# Patient Record
Sex: Male | Born: 1945
Health system: Southern US, Community
[De-identification: ages and names within clinical notes are randomized; demographics above are authoritative.]

## PROBLEM LIST (undated history)

## (undated) DIAGNOSIS — K219 Gastro-esophageal reflux disease without esophagitis: Secondary | ICD-10-CM

## (undated) DIAGNOSIS — I340 Nonrheumatic mitral (valve) insufficiency: Secondary | ICD-10-CM

## (undated) DIAGNOSIS — T8859XA Other complications of anesthesia, initial encounter: Secondary | ICD-10-CM

## (undated) DIAGNOSIS — J189 Pneumonia, unspecified organism: Secondary | ICD-10-CM

## (undated) DIAGNOSIS — M1990 Unspecified osteoarthritis, unspecified site: Secondary | ICD-10-CM

## (undated) DIAGNOSIS — K635 Polyp of colon: Secondary | ICD-10-CM

## (undated) DIAGNOSIS — I10 Essential (primary) hypertension: Secondary | ICD-10-CM

## (undated) DIAGNOSIS — E7800 Pure hypercholesterolemia, unspecified: Secondary | ICD-10-CM

## (undated) DIAGNOSIS — K649 Unspecified hemorrhoids: Secondary | ICD-10-CM

## (undated) DIAGNOSIS — T4145XA Adverse effect of unspecified anesthetic, initial encounter: Secondary | ICD-10-CM

## (undated) DIAGNOSIS — Z8619 Personal history of other infectious and parasitic diseases: Secondary | ICD-10-CM

## (undated) DIAGNOSIS — T7840XA Allergy, unspecified, initial encounter: Secondary | ICD-10-CM

## (undated) DIAGNOSIS — H9319 Tinnitus, unspecified ear: Secondary | ICD-10-CM

## (undated) HISTORY — PX: OTHER SURGICAL HISTORY: SHX169

## (undated) HISTORY — DX: Allergy, unspecified, initial encounter: T78.40XA

## (undated) HISTORY — PX: KNEE ARTHROSCOPY: SUR90

## (undated) HISTORY — PX: COLONOSCOPY: SHX174

## (undated) HISTORY — PX: ROTATOR CUFF REPAIR: SHX139

## (undated) HISTORY — PX: ESOPHAGEAL DILATION: SHX303

## (undated) HISTORY — DX: Polyp of colon: K63.5

## (undated) HISTORY — PX: HEMORRHOID BANDING: SHX5850

## (undated) HISTORY — PX: NASAL SEPTUM SURGERY: SHX37

## (undated) HISTORY — PX: TONSILLECTOMY: SUR1361

---

## 2004-08-18 ENCOUNTER — Observation Stay (HOSPITAL_COMMUNITY): Admission: RE | Admit: 2004-08-18 | Discharge: 2004-08-19 | Payer: Self-pay | Admitting: Orthopedic Surgery

## 2004-11-20 ENCOUNTER — Encounter (INDEPENDENT_AMBULATORY_CARE_PROVIDER_SITE_OTHER): Payer: Self-pay | Admitting: Specialist

## 2004-11-20 ENCOUNTER — Ambulatory Visit (HOSPITAL_COMMUNITY): Admission: RE | Admit: 2004-11-20 | Discharge: 2004-11-20 | Payer: Self-pay | Admitting: Gastroenterology

## 2009-09-26 ENCOUNTER — Encounter: Admission: RE | Admit: 2009-09-26 | Discharge: 2009-09-26 | Payer: Self-pay | Admitting: General Surgery

## 2010-07-06 DIAGNOSIS — J189 Pneumonia, unspecified organism: Secondary | ICD-10-CM

## 2010-07-06 HISTORY — DX: Pneumonia, unspecified organism: J18.9

## 2010-07-06 HISTORY — PX: HERNIA REPAIR: SHX51
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
## 2010-11-21 NOTE — Op Note (Signed)
NAME:  James Atkinson, James Atkinson                ACCOUNT NO.:  [id number]   MEDICAL RECORD NO.:  [id number]          PATIENT TYPE:  AMB   LOCATION:  ENDO                         FACILITY:  [facility]   PHYSICIAN:  John C. Madilyn Fireman, M.D.    DATE OF BIRTH:  10-Dec-1945   DATE OF PROCEDURE:  11/20/2004  DATE OF DISCHARGE:                                 OPERATIVE REPORT   INDICATIONS FOR PROCEDURE:  Average risk colon cancer screening   PROCEDURE:  The patient was placed in the left lateral decubitus position  and placed on pulse monitor with continuous low-flow oxygen delivered by  nasal cannula. He was sedated with 75 mcg IV fentanyl and 8 mg IV Versed.  The Olympus video colonoscope was inserted into the rectum and advanced to  the cecum, confirmed by transillumination of McBurney's point, visualization  of the ileocecal valve and appendiceal orifice. The prep was excellent. The  cecum, ascending, transverse, descending, and sigmoid colon all appeared  normal with no masses, polyps, diverticula, or other mucosal abnormalities.  Within the proximal rectum at approximately 18 cm, there was an 1.2-cm  pedunculated polyp that was removed by snare. The remainder of the rectum  appeared normal. The scope was then withdrawn, and the patient returned to  the recovery room in stable condition. He tolerated the procedure well.  There were no immediate complications.   IMPRESSION:  Rectal polyp.   PLAN:  Await histology to determine method and interval for future colon  screening.      JCH/MEDQ  D:  11/20/2004  T:  11/20/2004  Job:  [id number]   cc:   Leonia Reader, M.D.

## 2012-06-20 ENCOUNTER — Ambulatory Visit: Payer: Medicare Other | Admitting: Family Medicine

## 2012-06-20 VITALS — BP 117/68 | HR 60 | Temp 98.7°F | Resp 17 | Ht 66.0 in | Wt 165.0 lb

## 2012-06-20 DIAGNOSIS — L219 Seborrheic dermatitis, unspecified: Secondary | ICD-10-CM

## 2012-06-20 DIAGNOSIS — J029 Acute pharyngitis, unspecified: Secondary | ICD-10-CM

## 2012-06-20 DIAGNOSIS — H6690 Otitis media, unspecified, unspecified ear: Secondary | ICD-10-CM

## 2012-06-20 MED ORDER — HYDROCODONE-ACETAMINOPHEN 7.5-325 MG/15ML PO SOLN: 10.0000 mL | Freq: Four times a day (QID) | ORAL | Status: DC | PRN

## 2012-06-20 MED ORDER — FLUTICASONE PROPIONATE 50 MCG/ACT NA SUSP: 2.0000 | Freq: Every day | NASAL | Status: DC

## 2012-06-20 MED ORDER — AMOXICILLIN 875 MG PO TABS: 875.0000 mg | ORAL_TABLET | Freq: Three times a day (TID) | ORAL | Status: DC

## 2012-06-20 NOTE — Progress Notes (Signed)
  Subjective:    Patient ID: James Atkinson, male    DOB: 1946/03/09, 66 y.o.   MRN: [id number] Chief Complaint  Patient presents with  . Sore Throat    HPI  4d ago dev pharyngitis and URI but this a.m. his throat hurt so bad he couldn't hardly swallow.  Not eating but drinking plenty water. Lots of sick contacts but nothing specific.  Slight cough due to dryness in throat.  Fatigued but has been able to go to work.  Has been using tylenol ES (for arthritis) and nyquil.  Has had flaky discharge from ears for years, used to be on a steroid drop until his ENT doctor told him he shouldn't be using that regularly so now he has just been putting up with it. Past Medical History  Diagnosis Date  . Allergy     Review of Systems  Constitutional: Positive for chills, activity change, appetite change and fatigue. Negative for fever and diaphoresis.  HENT: Positive for ear pain, congestion, sore throat, rhinorrhea, trouble swallowing, postnasal drip and ear discharge.   Respiratory: Positive for cough.   Musculoskeletal: Positive for arthralgias. Negative for gait problem.  Skin: Positive for rash.  Neurological: Positive for headaches. Negative for dizziness, weakness, light-headedness and numbness.  Psychiatric/Behavioral: Positive for sleep disturbance.      BP 117/68  Pulse 60  Temp 98.7 F (37.1 C) (Oral)  Resp 17  Ht 5\' 6"  (1.676 m)  Wt 165 lb (74.844 kg)  BMI 26.63 kg/m2  SpO2 96% Objective:   Physical Exam  Constitutional: He is oriented to person, place, and time. He appears well-developed and well-nourished. No distress.  HENT:  Head: Normocephalic and atraumatic.  Right Ear: External ear and ear canal normal. Tympanic membrane is retracted. A middle ear effusion is present.  Left Ear: External ear and ear canal normal. Tympanic membrane is injected and bulging. A middle ear effusion is present.  Nose: Mucosal edema and rhinorrhea present. Right sinus exhibits maxillary sinus  tenderness. Left sinus exhibits maxillary sinus tenderness.  Mouth/Throat: Uvula is midline and mucous membranes are normal. Posterior oropharyngeal edema and posterior oropharyngeal erythema present. No oropharyngeal exudate.  Eyes: Conjunctivae normal are normal. Right eye exhibits no discharge. Left eye exhibits no discharge. No scleral icterus.  Neck: Normal range of motion. Neck supple. No thyromegaly present.  Cardiovascular: Normal rate, regular rhythm, normal heart sounds and intact distal pulses.   Pulmonary/Chest: Effort normal and breath sounds normal. No respiratory distress.  Lymphadenopathy:       Head (right side): Submandibular adenopathy present.       Head (left side): Submandibular adenopathy present.    He has no cervical adenopathy.       Right: No supraclavicular adenopathy present.       Left: No supraclavicular adenopathy present.  Neurological: He is alert and oriented to person, place, and time.  Skin: Skin is warm and dry. He is not diaphoretic. No erythema.       White greasy scaling skin in bilateral ear canals  Psychiatric: He has a normal mood and affect. His behavior is normal.      Assessment & Plan:   1. Otitis media - amoxicillin  2. Seborrheic dermatitis - declines treatment w/ steroid drops due to past ENT advice.  3. Pharyngitis - no strep test needed as covered w/ abx for AOM.

## 2012-08-25 DIAGNOSIS — E785 Hyperlipidemia, unspecified: Secondary | ICD-10-CM | POA: Diagnosis not present

## 2012-08-25 DIAGNOSIS — I10 Essential (primary) hypertension: Secondary | ICD-10-CM | POA: Diagnosis not present

## 2012-08-25 DIAGNOSIS — R21 Rash and other nonspecific skin eruption: Secondary | ICD-10-CM | POA: Diagnosis not present

## 2012-09-11 ENCOUNTER — Ambulatory Visit (INDEPENDENT_AMBULATORY_CARE_PROVIDER_SITE_OTHER): Payer: 59 | Admitting: Family Medicine

## 2012-09-11 VITALS — BP 124/70 | HR 92 | Temp 99.0°F | Resp 17 | Ht 66.0 in | Wt 162.0 lb

## 2012-09-11 DIAGNOSIS — R112 Nausea with vomiting, unspecified: Secondary | ICD-10-CM | POA: Diagnosis not present

## 2012-09-11 DIAGNOSIS — R197 Diarrhea, unspecified: Secondary | ICD-10-CM

## 2012-09-11 DIAGNOSIS — K5289 Other specified noninfective gastroenteritis and colitis: Secondary | ICD-10-CM | POA: Diagnosis not present

## 2012-09-11 MED ORDER — ONDANSETRON 4 MG PO TBDP: 4.0000 mg | ORAL_TABLET | Freq: Three times a day (TID) | ORAL | Status: DC | PRN

## 2012-09-11 NOTE — Progress Notes (Signed)
Subjective:    Patient ID: James Atkinson, Atkinson    DOB: 1946/04/24, 67 y.o.   MRN: [id number]  James Atkinson is a 67 y.o. Atkinson  Started this morning at 1:30 this morning, diarrhea, vomiting at 3:30 am - twice only.  diarhrea has persisted today.  Diarrhea about 4-5 times today. No further vomiting. Had eaten some greasy food last night - chips and pizza at restaurant.  No other sick individuals at home.  No etoh, no foreign travel, no new foods/raw foods, no recent antibiotics.   Temp at home 99.2.  Tx: pepto, sprite.  Drinking fluids, gatorade, but diarrhea 30 minutes later - feels like it goes straight through.  uop 2-3 times today - clear last uop.   Review of Systems  Constitutional: Positive for chills (off and on today. ) and fatigue. Negative for fever.  Gastrointestinal: Positive for nausea, vomiting and diarrhea. Negative for abdominal pain (cramping sensation only. subsides after diarrhea. ).  Endocrine: Positive for heat intolerance.  Genitourinary: Negative for dysuria, frequency, hematuria and difficulty urinating.  Psychiatric/Behavioral: Positive for behavioral problems.       Objective:   Physical Exam  Vitals reviewed. Constitutional: He is oriented to person, place, and time. He appears well-developed and well-nourished.  HENT:  Head: Normocephalic and atraumatic.  Mouth/Throat: Oropharynx is clear and moist.  Moist mucosa.   Eyes: EOM are normal. Pupils are equal, round, and reactive to light.  Neck: Carotid bruit is not present.  Cardiovascular: Normal rate, regular rhythm and normal heart sounds.   No murmur heard. Pulmonary/Chest: Effort normal and breath sounds normal. No respiratory distress. He has no rales.  Abdominal: Soft. He exhibits no distension. Bowel sounds are increased. There is no tenderness. There is no rebound, no guarding and no CVA tenderness.  Musculoskeletal: He exhibits no edema.  Neurological: He is alert and oriented to person,  place, and time.  Skin: Skin is warm and dry.  Psychiatric: He has a normal mood and affect. His behavior is normal.       Assessment & Plan:  James Atkinson is a 67 y.o. Atkinson Nausea with vomiting - Plan: ondansetron (ZOFRAN ODT) 4 MG disintegrating tablet  Diarrhea  Other and unspecified noninfectious gastroenteritis and colitis   Suspected viral GE, appears well hydrated at present, nontender abdomen and tolerating po fluids. Discussed ORT, zofran if needed, sx care as below incl pepto and immodium if needed,  And rtc precautions.   Patient Instructions  Frequent small amounts of fluid as discussed.  zofran if needed for nausea, recheck if not improving in next 2 days. Return to the clinic or go to the nearest emergency room if any of your symptoms worsen or new symptoms occur.   Gastroenteritis:  Diarrhea Infections caused by germs (bacterial) or a virus commonly cause diarrhea. Your caregiver has determined that with time, rest and fluids, the diarrhea should improve. In general, eat normally while drinking more water than usual. Although water may prevent dehydration, it does not contain salt and minerals (electrolytes). Broths, weak tea without caffeine and oral rehydration solutions (ORS) replace fluids and electrolytes. Small amounts of fluids should be taken frequently. Large amounts at one time may not be tolerated. Plain water may be harmful in infants and the elderly. Oral rehydrating solutions (ORS) are available at pharmacies and grocery stores. ORS replace water and important electrolytes in proper proportions. Sports drinks are not as effective as ORS and may be harmful due to sugars  worsening diarrhea.  ORS is especially recommended for use in children with diarrhea. As a general guideline for children, replace any new fluid losses from diarrhea and/or vomiting with ORS as follows:   If your child weighs 22 pounds or under (10 kg or less), give 60-120 mL ( -  cup or 2  - 4 ounces) of ORS for each episode of diarrheal stool or vomiting episode.   If your child weighs more than 22 pounds (more than 10 kgs), give 120-240 mL ( - 1 cup or 4 - 8 ounces) of ORS for each diarrheal stool or episode of vomiting.   While correcting for dehydration, children should eat normally. However, foods high in sugar should be avoided because this may worsen diarrhea. Large amounts of carbonated soft drinks, juice, gelatin desserts and other highly sugared drinks should be avoided.   After correction of dehydration, other liquids that are appealing to the child may be added. Children should drink small amounts of fluids frequently and fluids should be increased as tolerated. Children should drink enough fluids to keep urine clear or pale yellow.   Adults should eat normally while drinking more fluids than usual. Drink small amounts of fluids frequently and increase as tolerated. Drink enough fluids to keep urine clear or pale yellow. Broths, weak decaffeinated tea, lemon lime soft drinks (allowed to go flat) and ORS replace fluids and electrolytes.   Avoid:   Carbonated drinks.   Juice.   Extremely hot or cold fluids.   Caffeine drinks.   Fatty, greasy foods.   Alcohol.   Tobacco.   Too much intake of anything at one time.   Gelatin desserts.   Probiotics are active cultures of beneficial bacteria. They may lessen the amount and number of diarrheal stools in adults. Probiotics can be found in yogurt with active cultures and in supplements.   Wash hands well to avoid spreading bacteria and virus.   Anti-diarrheal medications are not recommended for infants and children.   Only take over-the-counter or prescription medicines for pain, discomfort or fever as directed by your caregiver. Do not give aspirin to children because it may cause Reye's Syndrome.   For adults, ask your caregiver if you should continue all prescribed and over-the-counter medicines.   If your  caregiver has given you a follow-up appointment, it is very important to keep that appointment. Not keeping the appointment could result in a chronic or permanent injury, and disability. If there is any problem keeping the appointment, you must call back to this facility for assistance.  SEEK IMMEDIATE MEDICAL CARE IF:   You or your child is unable to keep fluids down or other symptoms or problems become worse in spite of treatment.   Vomiting or diarrhea develops and becomes persistent.   There is vomiting of blood or bile (green material).   There is blood in the stool or the stools are black and tarry.   There is no urine output in 6-8 hours or there is only a small amount of very dark urine.   Abdominal pain develops, increases or localizes.   You have a fever.   Your baby is older than 3 months with a rectal temperature of 102 F (38.9 C) or higher.   Your baby is 11 months old or younger with a rectal temperature of 100.4 F (38 C) or higher.   You or your child develops excessive weakness, dizziness, fainting or extreme thirst.   You or your child develops a  rash, stiff neck, severe headache or become irritable or sleepy and difficult to awaken.  MAKE SURE YOU:   Understand these instructions.   Will watch your condition.   Will get help right away if you are not doing well or get worse.  Document Released: 06/12/2002 Document Revised: 06/11/2011 Document Reviewed: 04/29/2009 [facility] Patient Information 2012 [place], [place].  Nausea and Vomiting Nausea is a sick feeling that often comes before throwing up (vomiting). Vomiting is a reflex where stomach contents come out of your mouth. Vomiting can cause severe loss of body fluids (dehydration). Children and elderly adults can become dehydrated quickly, especially if they also have diarrhea. Nausea and vomiting are symptoms of a condition or disease. It is important to find the cause of your symptoms. CAUSES   Direct  irritation of the stomach lining. This irritation can result from increased acid production (gastroesophageal reflux disease), infection, food poisoning, taking certain medicines (such as nonsteroidal anti-inflammatory drugs), alcohol use, or tobacco use.   Signals from the brain.These signals could be caused by a headache, heat exposure, an inner ear disturbance, increased pressure in the brain from injury, infection, a tumor, or a concussion, pain, emotional stimulus, or metabolic problems.   An obstruction in the gastrointestinal tract (bowel obstruction).   Illnesses such as diabetes, hepatitis, gallbladder problems, appendicitis, kidney problems, cancer, sepsis, atypical symptoms of a heart attack, or eating disorders.   Medical treatments such as chemotherapy and radiation.   Receiving medicine that makes you sleep (general anesthetic) during surgery.  DIAGNOSIS Your caregiver may ask for tests to be done if the problems do not improve after a few days. Tests may also be done if symptoms are severe or if the reason for the nausea and vomiting is not clear. Tests may include:  Urine tests.   Blood tests.   Stool tests.   Cultures (to look for evidence of infection).   X-rays or other imaging studies.  Test results can help your caregiver make decisions about treatment or the need for additional tests. TREATMENT You need to stay well hydrated. Drink frequently but in small amounts.You may wish to drink water, sports drinks, clear broth, or eat frozen ice pops or gelatin dessert to help stay hydrated.When you eat, eating slowly may help prevent nausea.There are also some antinausea medicines that may help prevent nausea. HOME CARE INSTRUCTIONS   Take all medicine as directed by your caregiver.   If you do not have an appetite, do not force yourself to eat. However, you must continue to drink fluids.   If you have an appetite, eat a normal diet unless your caregiver tells you  differently.   Eat a variety of complex carbohydrates (rice, wheat, potatoes, bread), lean meats, yogurt, fruits, and vegetables.   Avoid high-fat foods because they are more difficult to digest.   Drink enough water and fluids to keep your urine clear or pale yellow.   If you are dehydrated, ask your caregiver for specific rehydration instructions. Signs of dehydration may include:   Severe thirst.   Dry lips and mouth.   Dizziness.   Dark urine.   Decreasing urine frequency and amount.   Confusion.   Rapid breathing or pulse.  SEEK IMMEDIATE MEDICAL CARE IF:   You have blood or brown flecks (like coffee grounds) in your vomit.   You have black or bloody stools.   You have a severe headache or stiff neck.   You are confused.   You have severe abdominal  pain.   You have chest pain or trouble breathing.   You do not urinate at least once every 8 hours.   You develop cold or clammy skin.   You continue to vomit for longer than 24 to 48 hours.   You have a fever.  MAKE SURE YOU:   Understand these instructions.   Will watch your condition.   Will get help right away if you are not doing well or get worse.  Document Released: 06/22/2005 Document Revised: 06/11/2011 Document Reviewed: 11/19/2010 [facility] Patient Information 2012 [place], [place].  Return to the clinic or go to the nearest emergency room if any of your symptoms worsen or new symptoms occur.     Meds ordered this encounter  Medications  . ondansetron (ZOFRAN ODT) 4 MG disintegrating tablet    Sig: Take 1 tablet (4 mg total) by mouth every 8 (eight) hours as needed for nausea.    Dispense:  10 tablet    Refill:  0

## 2012-09-11 NOTE — Patient Instructions (Signed)
Frequent small amounts of fluid as discussed.  zofran if needed for nausea, recheck if not improving in next 2 days. Return to the clinic or go to the nearest emergency room if any of your symptoms worsen or new symptoms occur.   Gastroenteritis:  Diarrhea Infections caused by germs (bacterial) or a virus commonly cause diarrhea. Your caregiver has determined that with time, rest and fluids, the diarrhea should improve. In general, eat normally while drinking more water than usual. Although water may prevent dehydration, it does not contain salt and minerals (electrolytes). Broths, weak tea without caffeine and oral rehydration solutions (ORS) replace fluids and electrolytes. Small amounts of fluids should be taken frequently. Large amounts at one time may not be tolerated. Plain water may be harmful in infants and the elderly. Oral rehydrating solutions (ORS) are available at pharmacies and grocery stores. ORS replace water and important electrolytes in proper proportions. Sports drinks are not as effective as ORS and may be harmful due to sugars worsening diarrhea.  ORS is especially recommended for use in children with diarrhea. As a general guideline for children, replace any new fluid losses from diarrhea and/or vomiting with ORS as follows:   If your child weighs 22 pounds or under (10 kg or less), give 60-120 mL ( -  cup or 2 - 4 ounces) of ORS for each episode of diarrheal stool or vomiting episode.   If your child weighs more than 22 pounds (more than 10 kgs), give 120-240 mL ( - 1 cup or 4 - 8 ounces) of ORS for each diarrheal stool or episode of vomiting.   While correcting for dehydration, children should eat normally. However, foods high in sugar should be avoided because this may worsen diarrhea. Large amounts of carbonated soft drinks, juice, gelatin desserts and other highly sugared drinks should be avoided.   After correction of dehydration, other liquids that are appealing to  the child may be added. Children should drink small amounts of fluids frequently and fluids should be increased as tolerated. Children should drink enough fluids to keep urine clear or pale yellow.   Adults should eat normally while drinking more fluids than usual. Drink small amounts of fluids frequently and increase as tolerated. Drink enough fluids to keep urine clear or pale yellow. Broths, weak decaffeinated tea, lemon lime soft drinks (allowed to go flat) and ORS replace fluids and electrolytes.   Avoid:   Carbonated drinks.   Juice.   Extremely hot or cold fluids.   Caffeine drinks.   Fatty, greasy foods.   Alcohol.   Tobacco.   Too much intake of anything at one time.   Gelatin desserts.   Probiotics are active cultures of beneficial bacteria. They may lessen the amount and number of diarrheal stools in adults. Probiotics can be found in yogurt with active cultures and in supplements.   Wash hands well to avoid spreading bacteria and virus.   Anti-diarrheal medications are not recommended for infants and children.   Only take over-the-counter or prescription medicines for pain, discomfort or fever as directed by your caregiver. Do not give aspirin to children because it may cause Reye's Syndrome.   For adults, ask your caregiver if you should continue all prescribed and over-the-counter medicines.   If your caregiver has given you a follow-up appointment, it is very important to keep that appointment. Not keeping the appointment could result in a chronic or permanent injury, and disability. If there is any problem keeping the appointment, you  must call back to this facility for assistance.  SEEK IMMEDIATE MEDICAL CARE IF:   You or your child is unable to keep fluids down or other symptoms or problems become worse in spite of treatment.   Vomiting or diarrhea develops and becomes persistent.   There is vomiting of blood or bile (green material).   There is blood in  the stool or the stools are black and tarry.   There is no urine output in 6-8 hours or there is only a small amount of very dark urine.   Abdominal pain develops, increases or localizes.   You have a fever.   Your baby is older than 3 months with a rectal temperature of 102 F (38.9 C) or higher.   Your baby is 54 months old or younger with a rectal temperature of 100.4 F (38 C) or higher.   You or your child develops excessive weakness, dizziness, fainting or extreme thirst.   You or your child develops a rash, stiff neck, severe headache or become irritable or sleepy and difficult to awaken.  MAKE SURE YOU:   Understand these instructions.   Will watch your condition.   Will get help right away if you are not doing well or get worse.  Document Released: 06/12/2002 Document Revised: 06/11/2011 Document Reviewed: 04/29/2009 [facility] Patient Information 2012 [place], [place].  Nausea and Vomiting Nausea is a sick feeling that often comes before throwing up (vomiting). Vomiting is a reflex where stomach contents come out of your mouth. Vomiting can cause severe loss of body fluids (dehydration). Children and elderly adults can become dehydrated quickly, especially if they also have diarrhea. Nausea and vomiting are symptoms of a condition or disease. It is important to find the cause of your symptoms. CAUSES   Direct irritation of the stomach lining. This irritation can result from increased acid production (gastroesophageal reflux disease), infection, food poisoning, taking certain medicines (such as nonsteroidal anti-inflammatory drugs), alcohol use, or tobacco use.   Signals from the brain.These signals could be caused by a headache, heat exposure, an inner ear disturbance, increased pressure in the brain from injury, infection, a tumor, or a concussion, pain, emotional stimulus, or metabolic problems.   An obstruction in the gastrointestinal tract (bowel obstruction).    Illnesses such as diabetes, hepatitis, gallbladder problems, appendicitis, kidney problems, cancer, sepsis, atypical symptoms of a heart attack, or eating disorders.   Medical treatments such as chemotherapy and radiation.   Receiving medicine that makes you sleep (general anesthetic) during surgery.  DIAGNOSIS Your caregiver may ask for tests to be done if the problems do not improve after a few days. Tests may also be done if symptoms are severe or if the reason for the nausea and vomiting is not clear. Tests may include:  Urine tests.   Blood tests.   Stool tests.   Cultures (to look for evidence of infection).   X-rays or other imaging studies.  Test results can help your caregiver make decisions about treatment or the need for additional tests. TREATMENT You need to stay well hydrated. Drink frequently but in small amounts.You may wish to drink water, sports drinks, clear broth, or eat frozen ice pops or gelatin dessert to help stay hydrated.When you eat, eating slowly may help prevent nausea.There are also some antinausea medicines that may help prevent nausea. HOME CARE INSTRUCTIONS   Take all medicine as directed by your caregiver.   If you do not have an appetite, do not force yourself to  eat. However, you must continue to drink fluids.   If you have an appetite, eat a normal diet unless your caregiver tells you differently.   Eat a variety of complex carbohydrates (rice, wheat, potatoes, bread), lean meats, yogurt, fruits, and vegetables.   Avoid high-fat foods because they are more difficult to digest.   Drink enough water and fluids to keep your urine clear or pale yellow.   If you are dehydrated, ask your caregiver for specific rehydration instructions. Signs of dehydration may include:   Severe thirst.   Dry lips and mouth.   Dizziness.   Dark urine.   Decreasing urine frequency and amount.   Confusion.   Rapid breathing or pulse.  SEEK IMMEDIATE  MEDICAL CARE IF:   You have blood or brown flecks (like coffee grounds) in your vomit.   You have black or bloody stools.   You have a severe headache or stiff neck.   You are confused.   You have severe abdominal pain.   You have chest pain or trouble breathing.   You do not urinate at least once every 8 hours.   You develop cold or clammy skin.   You continue to vomit for longer than 24 to 48 hours.   You have a fever.  MAKE SURE YOU:   Understand these instructions.   Will watch your condition.   Will get help right away if you are not doing well or get worse.  Document Released: 06/22/2005 Document Revised: 06/11/2011 Document Reviewed: 11/19/2010 [facility] Patient Information 2012 [place], [place].  Return to the clinic or go to the nearest emergency room if any of your symptoms worsen or new symptoms occur.

## 2013-02-24 DIAGNOSIS — R7309 Other abnormal glucose: Secondary | ICD-10-CM | POA: Diagnosis not present

## 2013-02-24 DIAGNOSIS — I10 Essential (primary) hypertension: Secondary | ICD-10-CM | POA: Diagnosis not present

## 2013-02-24 DIAGNOSIS — E785 Hyperlipidemia, unspecified: Secondary | ICD-10-CM | POA: Diagnosis not present

## 2013-02-24 DIAGNOSIS — Z1331 Encounter for screening for depression: Secondary | ICD-10-CM | POA: Diagnosis not present

## 2013-02-24 DIAGNOSIS — Z23 Encounter for immunization: Secondary | ICD-10-CM | POA: Diagnosis not present

## 2013-02-24 DIAGNOSIS — L408 Other psoriasis: Secondary | ICD-10-CM | POA: Diagnosis not present

## 2013-03-23 DIAGNOSIS — L219 Seborrheic dermatitis, unspecified: Secondary | ICD-10-CM | POA: Diagnosis not present

## 2013-03-23 DIAGNOSIS — D235 Other benign neoplasm of skin of trunk: Secondary | ICD-10-CM | POA: Diagnosis not present

## 2013-06-06 ENCOUNTER — Other Ambulatory Visit: Payer: Self-pay | Admitting: Family Medicine

## 2013-06-06 ENCOUNTER — Ambulatory Visit
Admission: RE | Admit: 2013-06-06 | Discharge: 2013-06-06 | Disposition: A | Discharge status: Home / Self Care | Payer: Medicare Other | Source: Ambulatory Visit | Attending: Family Medicine | Admitting: Family Medicine

## 2013-06-06 DIAGNOSIS — M5412 Radiculopathy, cervical region: Secondary | ICD-10-CM

## 2013-06-06 DIAGNOSIS — M5030 Other cervical disc degeneration, unspecified cervical region: Secondary | ICD-10-CM | POA: Diagnosis not present

## 2013-08-24 DIAGNOSIS — Z1159 Encounter for screening for other viral diseases: Secondary | ICD-10-CM | POA: Diagnosis not present

## 2013-08-24 DIAGNOSIS — Z125 Encounter for screening for malignant neoplasm of prostate: Secondary | ICD-10-CM | POA: Diagnosis not present

## 2013-08-24 DIAGNOSIS — Z23 Encounter for immunization: Secondary | ICD-10-CM | POA: Diagnosis not present

## 2013-08-24 DIAGNOSIS — I10 Essential (primary) hypertension: Secondary | ICD-10-CM | POA: Diagnosis not present

## 2013-08-24 DIAGNOSIS — Z0000 Encounter for general adult medical examination without abnormal findings: Secondary | ICD-10-CM | POA: Diagnosis not present

## 2013-08-24 DIAGNOSIS — R7309 Other abnormal glucose: Secondary | ICD-10-CM | POA: Diagnosis not present

## 2013-08-24 DIAGNOSIS — E785 Hyperlipidemia, unspecified: Secondary | ICD-10-CM | POA: Diagnosis not present

## 2013-08-30 DIAGNOSIS — E785 Hyperlipidemia, unspecified: Secondary | ICD-10-CM | POA: Diagnosis not present

## 2013-08-30 DIAGNOSIS — I10 Essential (primary) hypertension: Secondary | ICD-10-CM | POA: Diagnosis not present

## 2013-08-30 DIAGNOSIS — Z1159 Encounter for screening for other viral diseases: Secondary | ICD-10-CM | POA: Diagnosis not present

## 2013-08-30 DIAGNOSIS — Z125 Encounter for screening for malignant neoplasm of prostate: Secondary | ICD-10-CM | POA: Diagnosis not present

## 2013-08-30 DIAGNOSIS — R7309 Other abnormal glucose: Secondary | ICD-10-CM | POA: Diagnosis not present

## 2013-10-18 DIAGNOSIS — M1710 Unilateral primary osteoarthritis, unspecified knee: Secondary | ICD-10-CM | POA: Diagnosis not present

## 2013-11-07 DIAGNOSIS — M1710 Unilateral primary osteoarthritis, unspecified knee: Secondary | ICD-10-CM | POA: Diagnosis not present

## 2014-01-01 ENCOUNTER — Encounter: Payer: Self-pay | Admitting: Internal Medicine

## 2014-01-03 ENCOUNTER — Encounter: Payer: Self-pay | Admitting: Internal Medicine

## 2014-01-03 ENCOUNTER — Ambulatory Visit (INDEPENDENT_AMBULATORY_CARE_PROVIDER_SITE_OTHER): Payer: BC Managed Care – PPO | Admitting: Internal Medicine

## 2014-01-03 VITALS — BP 106/60 | HR 64 | Ht 66.0 in | Wt 156.6 lb

## 2014-01-03 DIAGNOSIS — R1310 Dysphagia, unspecified: Secondary | ICD-10-CM | POA: Insufficient documentation

## 2014-01-03 DIAGNOSIS — R1319 Other dysphagia: Secondary | ICD-10-CM

## 2014-01-03 DIAGNOSIS — R1314 Dysphagia, pharyngoesophageal phase: Secondary | ICD-10-CM

## 2014-01-03 NOTE — Assessment & Plan Note (Signed)
Chronic intermittent solid dysphagia - needs to be evaluated with EGD and plan for esophageal dilation.  The risks and benefits as well as alternatives of endoscopic procedure(s) have been discussed and reviewed. All questions answered. The patient agrees to proceed.

## 2014-01-03 NOTE — Progress Notes (Signed)
         Subjective:    Patient ID: James Atkinson, male    DOB: Jan 16, 1946, 68 y.o.   MRN: [id number]  HPI The patient is a very nice man who knows another patient of mine - and he is here for dysphagia. Years of intermittent dysphagia. Solid foods with suprasternal sticking point. Gets some SOB when impaction occurs. His dysphagia episodes are at least weekly. Denies weight loss. Has hx colon polyps removed by another gastroenterologist Sadie Haber)  Allergies  Allergen Reactions  . Ciprofloxacin Nausea Only   Outpatient Prescriptions Prior to Visit  Medication Sig Dispense Refill  . lisinopril (PRINIVIL,ZESTRIL) 20 MG tablet Take 20 mg by mouth daily.      Marland Kitchen amoxicillin (AMOXIL) 875 MG tablet Take 1 tablet (875 mg total) by mouth 3 (three) times daily.  30 tablet  0  . fluticasone (FLONASE) 50 MCG/ACT nasal spray Place 2 sprays into the nose daily.  16 g  6  . hydrocodone-acetaminophen (HYCET) 7.5-325 MG/15ML solution Take 10-15 mLs by mouth every 6 (six) hours as needed for pain.  140 mL  0  . ondansetron (ZOFRAN ODT) 4 MG disintegrating tablet Take 1 tablet (4 mg total) by mouth every 8 (eight) hours as needed for nausea.  10 tablet  0   No facility-administered medications prior to visit.   Past Medical History  Diagnosis Date  . Allergy   . Colon polyp    Past Surgical History  Procedure Laterality Date  . Hernia repair    . Rotator cuff repair    . Knee arthroscopy    . Colonoscopy     History   Social History  . Marital Status: Married    Spouse Name: N/A    Number of Children: 2  . Years of Education: N/A   Social History Main Topics  . Smoking status: Never Smoker   . Smokeless tobacco: Never Used  . Alcohol Use: No  . Drug Use: No  . Sexual Activity: Yes    Birth Control/ Protection: None    Social History Narrative   Married, 2 adult children, 2 grandchildren   Chief Financial Officer   No caffeine   Family History  Problem Relation Age of Onset  .  COPD Father    Review of Systems Reading glasses, awaiting bilateral knee surgery ([place]) All other ROS negative or as per HPI    Objective:   Physical Exam General:  Well-developed, well-nourished and in no acute distress Eyes:  anicteric. ENT:   Mouth and posterior pharynx free of lesions.  Neck:   supple w/o thyromegaly or mass.  Lungs: Clear to auscultation bilaterally. Heart:  S1S2, no rubs, murmurs, gallops. Abdomen:  soft, non-tender, no hepatosplenomegaly, hernia, or mass and BS+.  Lymph:  no cervical or supraclavicular adenopathy. Neuro:  A&O x 3.  Psych:  appropriate mood and  Affect.      Assessment & Plan:  Esophageal dysphagia Chronic intermittent solid dysphagia - needs to be evaluated with EGD and plan for esophageal dilation.  The risks and benefits as well as alternatives of endoscopic procedure(s) have been discussed and reviewed. All questions answered. The patient agrees to proceed.   I appreciate the opportunity to care for this patient.  NW:[id number], Gwyndolyn Saxon, MD

## 2014-01-08 ENCOUNTER — Encounter: Payer: Self-pay | Admitting: Internal Medicine

## 2014-01-08 ENCOUNTER — Ambulatory Visit (AMBULATORY_SURGERY_CENTER): Payer: BC Managed Care – PPO | Admitting: Internal Medicine

## 2014-01-08 VITALS — BP 131/63 | HR 55 | Temp 97.9°F | Resp 21 | Ht 66.0 in | Wt 156.0 lb

## 2014-01-08 DIAGNOSIS — R1314 Dysphagia, pharyngoesophageal phase: Secondary | ICD-10-CM

## 2014-01-08 DIAGNOSIS — K2210 Ulcer of esophagus without bleeding: Secondary | ICD-10-CM

## 2014-01-08 DIAGNOSIS — K208 Other esophagitis without bleeding: Secondary | ICD-10-CM

## 2014-01-08 DIAGNOSIS — K222 Esophageal obstruction: Secondary | ICD-10-CM | POA: Diagnosis not present

## 2014-01-08 DIAGNOSIS — K209 Esophagitis, unspecified without bleeding: Secondary | ICD-10-CM | POA: Diagnosis not present

## 2014-01-08 DIAGNOSIS — I10 Essential (primary) hypertension: Secondary | ICD-10-CM | POA: Diagnosis not present

## 2014-01-08 DIAGNOSIS — R1310 Dysphagia, unspecified: Secondary | ICD-10-CM | POA: Diagnosis not present

## 2014-01-08 MED ORDER — SODIUM CHLORIDE 0.9 % IV SOLN: 500.0000 mL | INTRAVENOUS | Status: DC

## 2014-01-08 MED ORDER — PANTOPRAZOLE SODIUM 40 MG PO TBEC: 40.0000 mg | DELAYED_RELEASE_TABLET | Freq: Every day | ORAL | Status: DC

## 2014-01-08 NOTE — Progress Notes (Signed)
Report to PACU, RN, vss, BBS= Clear.  

## 2014-01-08 NOTE — Progress Notes (Signed)
Called to room to assist during endoscopic procedure.  Patient ID and intended procedure confirmed with present staff. Received instructions for my participation in the procedure from the performing physician.  

## 2014-01-08 NOTE — Op Note (Signed)
[place]  [address]. [place], [id number]   ENDOSCOPY PROCEDURE REPORT  PATIENT: James, Atkinson  MR#: [id number] BIRTHDATE: 1945-10-06 , 71  yrs. old GENDER: Male ENDOSCOPIST: Gatha Mayer, MD, [facility] PROCEDURE DATE:  01/08/2014 PROCEDURE:  EGD w/ biopsy  and balloon dilation < 30 mm ASA CLASS:     Class II INDICATIONS:  Dysphagia. MEDICATIONS: propofol (Diprivan) 100mg  IV, MAC sedation, administered by CRNA, and These medications were titrated to patient response per physician's verbal order TOPICAL ANESTHETIC: none  DESCRIPTION OF PROCEDURE: After the risks benefits and alternatives of the procedure were thoroughly explained, informed consent was obtained.  The LB [id number] endoscope was introduced through the mouth and advanced to the second portion of the duodenum. Without limitations.  The instrument was slowly withdrawn as the mucosa was fully examined.    ESOPHAGUS: A stricture was found at the gastroesophageal junction. The stenosis was traversable with the endoscope.   Erosion was found in the lower third of the esophagus.   1.5 cm long - biopsied x 2.  The remainder of the upper endoscopy exam was otherwise normal. Retroflexed views revealed no abnormalities.    The stricture was dilated 15, 16.5 and 18 mm with a balloon, some heme with good dilation effect. The scope was then withdrawn from the patient and the procedure completed.  COMPLICATIONS: There were no complications. ENDOSCOPIC IMPRESSION: 1.   Stricture (looks benign, from reflux) was found at the gastroesophageal junction - dilated to 18 mm 2.   Erosion was found in the in the lower third of the esophagus - biopsied 3.   The remainder of the upper endoscopy exam was otherwise normal  RECOMMENDATIONS: 1.  Clear liquids until 530 PM , then soft foods rest of day. Resume prior diet tomorrow. 2.  Await pathology results 3.  PPI qam - pantoprazole 40 mg daily before  breakfast, Rx sent - should stay on forever 4.  Office will call with results and f/u plans  eSigned:  Gatha Mayer, MD, [facility] 01/08/2014 4:37 PM  [id number], Barbette Reichmann MD and The Patient

## 2014-01-08 NOTE — Patient Instructions (Addendum)
There was inflammation and scarring where the esophagus and stomach meet. I took biopsies and dilated this. I think its all from acid reflux.  I will let you know about the biopsies. Please pick up the new medication pantoprazole at [employer] - it blocks acid and heals the esophagus and prevents further problems like this.  I appreciate the opportunity to care for you. Gatha Mayer, MD, FACG   YOU HAD AN ENDOSCOPIC PROCEDURE TODAY AT [facility]: Refer to the procedure report that was given to you for any specific questions about what was found during the examination.  If the procedure report does not answer your questions, please call your gastroenterologist to clarify.  If you requested that your care partner not be given the details of your procedure findings, then the procedure report has been included in a sealed envelope for you to review at your convenience later.  YOU SHOULD EXPECT: Some feelings of bloating in the abdomen. Passage of more gas than usual.  Walking can help get rid of the air that was put into your GI tract during the procedure and reduce the bloating. If you had a lower endoscopy (such as a colonoscopy or flexible sigmoidoscopy) you may notice spotting of blood in your stool or on the toilet paper. If you underwent a bowel prep for your procedure, then you may not have a normal bowel movement for a few days.  DIET: Clear liquids until 5:30pm then a soft diet for the rest of the day.   You have Reflux issues, and no cancer.  You need to take Pantoprazole 40 mg 1/2 hour before breakfast every day.   Drink plenty of fluids but you should avoid alcoholic beverages for 24 hours.  ACTIVITY: Your care partner should take you home directly after the procedure.  You should plan to take it easy, moving slowly for the rest of the day.  You can resume normal activity the day after the procedure however you should NOT DRIVE or use heavy machinery for 24 hours (because  of the sedation medicines used during the test).    SYMPTOMS TO REPORT IMMEDIATELY: A gastroenterologist can be reached at any hour.  During normal business hours, 8:30 AM to 5:00 PM Monday through Friday, call [phone].  After hours and on weekends, please call the GI answering service at [phone] who will take a message and have the physician on call contact you.   Following upper endoscopy (EGD)  Vomiting of blood or coffee ground material  New chest pain or pain under the shoulder blades  Painful or persistently difficult swallowing  New shortness of breath  Fever of 100F or higher  Black, tarry-looking stools  FOLLOW UP: If any biopsies were taken you will be contacted by phone or by letter within the next 1-3 weeks.  Call your gastroenterologist if you have not heard about the biopsies in 3 weeks.  Our staff will call the home number listed on your records the next business day following your procedure to check on you and address any questions or concerns that you may have at that time regarding the information given to you following your procedure. This is a courtesy call and so if there is no answer at the home number and we have not heard from you through the emergency physician on call, we will assume that you have returned to your regular daily activities without incident.  SIGNATURES/CONFIDENTIALITY: You and/or your care partner have signed paperwork  which will be entered into your electronic medical record.  These signatures attest to the fact that that the information above on your After Visit Summary has been reviewed and is understood.  Full responsibility of the confidentiality of this discharge information lies with you and/or your care-partner.  Read all of your handouts given to you by your recovery room nurse.

## 2014-01-09 ENCOUNTER — Telehealth: Payer: Self-pay | Admitting: *Deleted

## 2014-01-09 ENCOUNTER — Other Ambulatory Visit: Payer: Self-pay | Admitting: *Deleted

## 2014-01-09 DIAGNOSIS — K2210 Ulcer of esophagus without bleeding: Secondary | ICD-10-CM

## 2014-01-09 MED ORDER — PANTOPRAZOLE SODIUM 40 MG PO TBEC: 40.0000 mg | DELAYED_RELEASE_TABLET | Freq: Every day | ORAL | Status: DC

## 2014-01-09 NOTE — Telephone Encounter (Signed)
  Follow up Call-  Call back number 01/08/2014  Post procedure Call Back phone  # [phone]  Permission to leave phone message Yes     Patient questions:  Do you have a fever, pain , or abdominal swelling? No. Pain Score  0 *  Have you tolerated food without any problems? Yes.    Have you been able to return to your normal activities? Yes.    Do you have any questions about your discharge instructions: Diet   No. Medications  No. Follow up visit  No.  Do you have questions or concerns about your Care? No.  Actions: * If pain score is 4 or above: No action needed, pain <4.

## 2014-01-14 NOTE — Progress Notes (Signed)
Quick Note:  Biopsies show some inflammation as expected - no cancer or anything bad  Stay on PPI chronically - as long as no swallowing problems after a few months then see me as needed for swallowing problems PCP should be ok to refill PPI   [facility]  No letter or recall - cc PCP ______

## 2014-01-29 ENCOUNTER — Encounter (HOSPITAL_COMMUNITY): Payer: Self-pay | Admitting: Pharmacy Technician

## 2014-01-30 ENCOUNTER — Other Ambulatory Visit: Payer: Self-pay | Admitting: Orthopedic Surgery

## 2014-02-01 ENCOUNTER — Inpatient Hospital Stay (HOSPITAL_COMMUNITY): Admission: RE | Admit: 2014-02-01 | Payer: BC Managed Care – PPO | Source: Ambulatory Visit

## 2014-02-02 DIAGNOSIS — I10 Essential (primary) hypertension: Secondary | ICD-10-CM | POA: Diagnosis not present

## 2014-02-02 DIAGNOSIS — R7309 Other abnormal glucose: Secondary | ICD-10-CM | POA: Diagnosis not present

## 2014-02-02 DIAGNOSIS — E785 Hyperlipidemia, unspecified: Secondary | ICD-10-CM | POA: Diagnosis not present

## 2014-02-02 DIAGNOSIS — D649 Anemia, unspecified: Secondary | ICD-10-CM | POA: Diagnosis not present

## 2014-02-02 DIAGNOSIS — K219 Gastro-esophageal reflux disease without esophagitis: Secondary | ICD-10-CM | POA: Diagnosis not present

## 2014-02-02 NOTE — Patient Instructions (Addendum)
21 James Atkinson  02/05/2014   Your procedure is scheduled on: Wednesday 02/14/14  Report to [place] at 09:00 AM.  Call this number if you have problems the morning of surgery [phone]: [phone]   Remember:   Do not eat food or drink liquids After Midnight.     Take these medicines the morning of surgery with A SIP OF WATER: pantoprazole, eye drops if needed   Do not wear jewelry, make-up or nail polish.  Do not wear lotions, powders, or perfumes. You may wear deodorant.  Do not shave 48 hours prior to surgery. Men may shave face and neck.  Do not bring valuables to the hospital.  Contacts, dentures or bridgework may not be worn into surgery.  Leave suitcase in the car. After surgery it may be brought to your room.  For patients admitted to the hospital, checkout time is 11:00 AM the day of discharge.    Please read over the following fact sheets that you were given: MRSA Information  Paulette Blanch, RN  pre op nurse call if needed [phone]     [facility] - Preparing for Surgery Before surgery, you can play an important role.  Because skin is not sterile, your skin needs to be as free of germs as possible.  You can reduce the number of germs on your skin by washing with CHG (chlorahexidine gluconate) soap before surgery.  CHG is an antiseptic cleaner which kills germs and bonds with the skin to continue killing germs even after washing. Please DO NOT use if you have an allergy to CHG or antibacterial soaps.  If your skin becomes reddened/irritated stop using the CHG and inform your nurse when you arrive at Short Stay. Do not shave (including legs and underarms) for at least 48 hours prior to the first CHG shower.  You may shave your face/neck. Please follow these instructions carefully:  1.  Shower with CHG Soap the night before surgery and the  morning of Surgery.  2.  If you choose to wash your hair, wash your hair first as usual with your  normal  shampoo.  3.   After you shampoo, rinse your hair and body thoroughly to remove the  shampoo.                            4.  Use CHG as you would any other liquid soap.  You can apply chg directly  to the skin and wash                       Gently with a scrungie or clean washcloth.  5.  Apply the CHG Soap to your body ONLY FROM THE NECK DOWN.   Do not use on face/ open                           Wound or open sores. Avoid contact with eyes, ears mouth and genitals (private parts).                       Wash face,  Genitals (private parts) with your normal soap.             6.  Wash thoroughly, paying special attention to the area where your surgery  will be performed.  7.  Thoroughly rinse your body with warm water from  the neck down.  8.  DO NOT shower/wash with your normal soap after using and rinsing off  the CHG Soap.                9.  Pat yourself dry with a clean towel.            10.  Wear clean pajamas.            11.  Place clean sheets on your bed the night of your first shower and do not  sleep with pets. Day of Surgery : Do not apply any lotions/deodorants the morning of surgery.  Please wear clean clothes to the hospital/surgery center.  FAILURE TO FOLLOW THESE INSTRUCTIONS MAY RESULT IN THE CANCELLATION OF YOUR SURGERY PATIENT SIGNATURE_________________________________  NURSE SIGNATURE__________________________________  ________________________________________________________________________  WHAT IS A BLOOD TRANSFUSION? Blood Transfusion Information  A transfusion is the replacement of blood or some of its parts. Blood is made up of multiple cells which provide different functions.  Red blood cells carry oxygen and are used for blood loss replacement.  White blood cells fight against infection.  Platelets control bleeding.  Plasma helps clot blood.  Other blood products are available for specialized needs, such as hemophilia or other clotting disorders. BEFORE THE TRANSFUSION   Who gives blood for transfusions?   Healthy volunteers who are fully evaluated to make sure their blood is safe. This is blood bank blood. Transfusion therapy is the safest it has ever been in the practice of medicine. Before blood is taken from a donor, a complete history is taken to make sure that person has no history of diseases nor engages in risky social behavior (examples are intravenous drug use or sexual activity with multiple partners). The donor's travel history is screened to minimize risk of transmitting infections, such as malaria. The donated blood is tested for signs of infectious diseases, such as HIV and hepatitis. The blood is then tested to be sure it is compatible with you in order to minimize the chance of a transfusion reaction. If you or a relative donates blood, this is often done in anticipation of surgery and is not appropriate for emergency situations. It takes many days to process the donated blood. RISKS AND COMPLICATIONS Although transfusion therapy is very safe and saves many lives, the main dangers of transfusion include:   Getting an infectious disease.  Developing a transfusion reaction. This is an allergic reaction to something in the blood you were given. Every precaution is taken to prevent this. The decision to have a blood transfusion has been considered carefully by your caregiver before blood is given. Blood is not given unless the benefits outweigh the risks. AFTER THE TRANSFUSION  Right after receiving a blood transfusion, you will usually feel much better and more energetic. This is especially true if your red blood cells have gotten low (anemic). The transfusion raises the level of the red blood cells which carry oxygen, and this usually causes an energy increase.  The nurse administering the transfusion will monitor you carefully for complications. HOME CARE INSTRUCTIONS  No special instructions are needed after a transfusion. You may find your energy  is better. Speak with your caregiver about any limitations on activity for underlying diseases you may have. SEEK MEDICAL CARE IF:   Your condition is not improving after your transfusion.  You develop redness or irritation at the intravenous (IV) site. SEEK IMMEDIATE MEDICAL CARE IF:  Any of the following symptoms occur over the next 12  hours:  Shaking chills.  You have a temperature by mouth above 102 F (38.9 C), not controlled by medicine.  Chest, back, or muscle pain.  People around you feel you are not acting correctly or are confused.  Shortness of breath or difficulty breathing.  Dizziness and fainting.  You get a rash or develop hives.  You have a decrease in urine output.  Your urine turns a dark color or changes to pink, red, or brown. Any of the following symptoms occur over the next 10 days:  You have a temperature by mouth above 102 F (38.9 C), not controlled by medicine.  Shortness of breath.  Weakness after normal activity.  The white part of the eye turns yellow (jaundice).  You have a decrease in the amount of urine or are urinating less often.  Your urine turns a dark color or changes to pink, red, or brown. Document Released: 06/19/2000 Document Revised: 09/14/2011 Document Reviewed: 02/06/2008 ExitCare Patient Information 2014 [place].  _______________________________________________________________________  Incentive Spirometer  An incentive spirometer is a tool that can help keep your lungs clear and active. This tool measures how well you are filling your lungs with each breath. Taking long deep breaths may help reverse or decrease the chance of developing breathing (pulmonary) problems (especially infection) following:  A long period of time when you are unable to move or be active. BEFORE THE PROCEDURE   If the spirometer includes an indicator to show your best effort, your nurse or respiratory therapist will set it to a desired  goal.  If possible, sit up straight or lean slightly forward. Try not to slouch.  Hold the incentive spirometer in an upright position. INSTRUCTIONS FOR USE  1. Sit on the edge of your bed if possible, or sit up as far as you can in bed or on a chair. 2. Hold the incentive spirometer in an upright position. 3. Breathe out normally. 4. Place the mouthpiece in your mouth and seal your lips tightly around it. 5. Breathe in slowly and as deeply as possible, raising the piston or the ball toward the top of the column. 6. Hold your breath for 3-5 seconds or for as long as possible. Allow the piston or ball to fall to the bottom of the column. 7. Remove the mouthpiece from your mouth and breathe out normally. 8. Rest for a few seconds and repeat Steps 1 through 7 at least 10 times every 1-2 hours when you are awake. Take your time and take a few normal breaths between deep breaths. 9. The spirometer may include an indicator to show your best effort. Use the indicator as a goal to work toward during each repetition. 10. After each set of 10 deep breaths, practice coughing to be sure your lungs are clear. If you have an incision (the cut made at the time of surgery), support your incision when coughing by placing a pillow or rolled up towels firmly against it. Once you are able to get out of bed, walk around indoors and cough well. You may stop using the incentive spirometer when instructed by your caregiver.  RISKS AND COMPLICATIONS  Take your time so you do not get dizzy or light-headed.  If you are in pain, you may need to take or ask for pain medication before doing incentive spirometry. It is harder to take a deep breath if you are having pain. AFTER USE  Rest and breathe slowly and easily.  It can be helpful to keep  track of a log of your progress. Your caregiver can provide you with a simple table to help with this. If you are using the spirometer at home, follow these instructions: [place] IF:   You are having difficultly using the spirometer.  You have trouble using the spirometer as often as instructed.  Your pain medication is not giving enough relief while using the spirometer.  You develop fever of 100.5 F (38.1 C) or higher. SEEK IMMEDIATE MEDICAL CARE IF:   You cough up bloody sputum that had not been present before.  You develop fever of 102 F (38.9 C) or greater.  You develop worsening pain at or near the incision site. MAKE SURE YOU:   Understand these instructions.  Will watch your condition.  Will get help right away if you are not doing well or get worse. Document Released: 11/02/2006 Document Revised: 09/14/2011 Document Reviewed: 01/03/2007 [facility] Patient Information 2014 [place], [place].   ________________________________________________________________________

## 2014-02-05 ENCOUNTER — Encounter (HOSPITAL_COMMUNITY): Payer: Self-pay

## 2014-02-05 ENCOUNTER — Encounter (HOSPITAL_COMMUNITY)
Admission: RE | Admit: 2014-02-05 | Discharge: 2014-02-05 | Disposition: A | Discharge status: Home / Self Care | Payer: BC Managed Care – PPO | Source: Ambulatory Visit | Attending: Orthopedic Surgery | Admitting: Orthopedic Surgery

## 2014-02-05 ENCOUNTER — Ambulatory Visit (HOSPITAL_COMMUNITY)
Admission: RE | Admit: 2014-02-05 | Discharge: 2014-02-05 | Disposition: A | Discharge status: Home / Self Care | Payer: BC Managed Care – PPO | Source: Ambulatory Visit | Attending: Anesthesiology | Admitting: Anesthesiology

## 2014-02-05 DIAGNOSIS — Z01818 Encounter for other preprocedural examination: Secondary | ICD-10-CM | POA: Insufficient documentation

## 2014-02-05 HISTORY — DX: Unspecified osteoarthritis, unspecified site: M19.90

## 2014-02-05 HISTORY — DX: Pneumonia, unspecified organism: J18.9

## 2014-02-05 HISTORY — DX: Tinnitus, unspecified ear: H93.19

## 2014-02-05 HISTORY — DX: Essential (primary) hypertension: I10

## 2014-02-05 HISTORY — DX: Other complications of anesthesia, initial encounter: T88.59XA

## 2014-02-05 HISTORY — DX: Gastro-esophageal reflux disease without esophagitis: K21.9

## 2014-02-05 HISTORY — DX: Personal history of other infectious and parasitic diseases: Z86.19

## 2014-02-05 HISTORY — DX: Pure hypercholesterolemia, unspecified: E78.00

## 2014-02-05 HISTORY — DX: Adverse effect of unspecified anesthetic, initial encounter: T41.45XA

## 2014-02-05 HISTORY — DX: Unspecified hemorrhoids: K64.9

## 2014-02-05 LAB — COMPREHENSIVE METABOLIC PANEL
ALT: 17 U/L (ref 0–53)
AST: 18 U/L (ref 0–37)
Albumin: 3.7 g/dL (ref 3.5–5.2)
Alkaline Phosphatase: 69 U/L (ref 39–117)
Anion gap: 8 (ref 5–15)
BILIRUBIN TOTAL: 0.4 mg/dL (ref 0.3–1.2)
BUN: 21 mg/dL (ref 6–23)
CHLORIDE: 103 meq/L (ref 96–112)
CO2: 28 meq/L (ref 19–32)
Calcium: 9.9 mg/dL (ref 8.4–10.5)
Creatinine, Ser: 0.87 mg/dL (ref 0.50–1.35)
GFR calc Af Amer: >90 mL/min (ref >90)
GFR, EST NON AFRICAN AMERICAN: 88 mL/min — AB (ref >90)
Glucose, Bld: 115 mg/dL — ABNORMAL HIGH (ref 70–99)
POTASSIUM: 5.1 meq/L (ref 3.7–5.3)
SODIUM: 139 meq/L (ref 137–147)
Total Protein: 6.7 g/dL (ref 6.0–8.3)

## 2014-02-05 LAB — URINALYSIS, ROUTINE W REFLEX MICROSCOPIC
BILIRUBIN URINE: NEGATIVE
GLUCOSE, UA: NEGATIVE mg/dL
Hgb urine dipstick: NEGATIVE
KETONES UR: NEGATIVE mg/dL
Leukocytes, UA: NEGATIVE
Nitrite: NEGATIVE
PH: 6 (ref 5.0–8.0)
Protein, ur: NEGATIVE mg/dL
Specific Gravity, Urine: 1.013 (ref 1.005–1.030)
Urobilinogen, UA: 0.2 mg/dL (ref 0.0–1.0)

## 2014-02-05 LAB — CBC
HCT: 37.2 % — ABNORMAL LOW (ref 39.0–52.0)
Hemoglobin: 12.7 g/dL — ABNORMAL LOW (ref 13.0–17.0)
MCH: 31 pg (ref 26.0–34.0)
MCHC: 34.1 g/dL (ref 30.0–36.0)
MCV: 90.7 fL (ref 78.0–100.0)
PLATELETS: 144 10*3/uL — AB (ref 150–400)
RBC: 4.1 MIL/uL — AB (ref 4.22–5.81)
RDW: 12.5 % (ref 11.5–15.5)
WBC: 3.8 10*3/uL — AB (ref 4.0–10.5)

## 2014-02-05 LAB — APTT: aPTT: 28 seconds (ref 24–37)

## 2014-02-05 LAB — PROTIME-INR
INR: 0.99 (ref 0.00–1.49)
Prothrombin Time: 13.1 seconds (ref 11.6–15.2)

## 2014-02-05 LAB — SURGICAL PCR SCREEN
MRSA, PCR: NEGATIVE
Staphylococcus aureus: POSITIVE — AB

## 2014-02-05 NOTE — Progress Notes (Signed)
Surgery clearance note Dr. Shirline Frees on chart

## 2014-02-05 NOTE — Progress Notes (Signed)
Mupriocin 2% ointment called into [facility] on [address]. Left message with Abigail Butts at Dr. Anne Fu office and faxed nose swab results to Dr. [name]. Pt notified and has no questions or concerns at this time.

## 2014-02-05 NOTE — Progress Notes (Signed)
02/05/14 0949  OBSTRUCTIVE SLEEP APNEA  Have you ever been diagnosed with sleep apnea through a sleep study? No  Do you snore loudly (loud enough to be heard through closed doors)?  1  Do you often feel tired, fatigued, or sleepy during the daytime? 0  Has anyone observed you stop breathing during your sleep? 0  Do you have, or are you being treated for high blood pressure? 1  BMI more than 35 kg/m2? 0  Age over 67 years old? 1  Neck circumference greater than 40 cm/16 inches? 0  Gender: 1  Obstructive Sleep Apnea Score 4

## 2014-02-13 ENCOUNTER — Other Ambulatory Visit: Payer: Self-pay | Admitting: Orthopedic Surgery

## 2014-02-13 NOTE — H&P (Signed)
James Atkinson DOB: 1945/07/23 Married / Language: English / Race: White Male Date of Admission:  02/14/2014 Chief Complaint:  Bilateral Knee Pain History of Present Illness  The patient is a 68 year old male who comes in for a preoperative History and Physical. The patient is scheduled for a bilateral total knee arthroplasty to be performed by Dr. Dione Plover. [name], MD at [facility] on 02/14/2014. The patient is a 68 year old male who presents for follow up of their knee. The patient is being followed for their bilateral knee pain and osteoarthritis. Symptoms reported today include: pain. The patient feels that they are doing poorly and report their pain level to be moderate to severe. The following medication has been used for pain control: antiinflammatory medication (ibuprofen). James Atkinson comes back in today to discuss bilateral knee replacements. He saw [name], [facility] last visit and was found to have bialteral knee osteoarthritis. The right knee is already bone on bone and the left knee is very close behind. 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 He wants to get them replaced. It was recommended to undergo bilateral knees at his last visit and came back in to discuss this with Dr. [name]. He would like to look into inpatient rehab following surgery as not to put any stress on his wife during his reahabilitation. He unfortunately has had progressively worsening pain and dysfunction over time. The knees are bothering him at all times. It is limiting what he can and cannot do. He saw [name] last week and bilateral total knee arthroplasty was discussed. He is here today to discuss that and to go over details. He has not had injections recently but injections at this stage are unlikely to impact him given the significant arthritis and significant dysfunction. They have been treated conservatively in the  past for the above stated problem and despite conservative measures, they continue to have progressive pain and severe functional limitations and dysfunction. They have failed non-operative management including home exercise, medications, and injections. It is felt that they would benefit from undergoing bilateral total joint replacements. Risks and benefits of the procedure have been discussed with the patient and they elect to proceed with surgery. There are no active contraindications to surgery such as ongoing infection or rapidly progressive neurological disease.  Allergies  Cipro *Fluoroquinolones** Nausea. Intolerance  Problem List/Past Medical  Primary osteoarthritis of both knees (715.16  M17.0) Hypercholesterolemia High blood pressure Gastroesophageal Reflux Disease Shingles Pneumonia Hemorrhoids Measles Mumps   Family History Cancer Mother, Paternal Grandfather. Chronic Obstructive Lung Disease Father. Hypertension Father.  Social History  Exercise Exercises rarely; does other Former drinker 10/18/2013: In the past drank Living situation live with spouse Children 2 Current work status working full time Number of flights of stairs before winded 2-3 Tobacco / smoke exposure 10/18/2013: yes outdoors only Tobacco use Former smoker. 10/18/2013: smoke(d) 1 pack(s) per day Marital status married No history of drug/alcohol rehab Not under pain contract Post-Surgical Plans Plan to look into Rehab Advance Directives Living Will  Medication History Lisinopril (20MG  Tablet, Oral) Active. Multivitamins (Oral) Active. Ibuprofen (200MG  Tablet, Oral) Active. Fish Oil Burp-Less (500MG  Capsule, Oral) Active. Cinnamon (Oral) Specific dose unknown - Active. Iron Supplement (Oral) Specific dose unknown - Active. Vinegar Active. Betaprostate Active.  Past Surgical History  Straighten Nasal Septum Tonsillectomy Colon Polyp Removal -  Colonoscopy Inguinal Hernia Repair open: bilateral Rotator Cuff Repair  left Arthroscopy of Knee left  Review of Systems General Present- Weight Loss (planned weight loss for cholesterol control). Not Present- Chills, Fatigue, Fever, Memory Loss, Night Sweats and Weight Gain. Skin Not Present- Eczema, Hives, Itching, Lesions and Rash. HEENT Present- Hearing Loss and Tinnitus. Not Present- Dentures, Double Vision, Headache and Visual Loss. Respiratory Not Present- Allergies, Chronic Cough, Coughing up blood, Shortness of breath at rest and Shortness of breath with exertion. Cardiovascular Not Present- Chest Pain, Difficulty Breathing Lying Down, Murmur, Palpitations, Racing/skipping heartbeats and Swelling. Gastrointestinal Not Present- Abdominal Pain, Bloody Stool, Constipation, Diarrhea, Difficulty Swallowing, Heartburn, Jaundice, Loss of appetitie, Nausea and Vomiting. Male Genitourinary Present- Urinary frequency and Urinating at Night. Not Present- Blood in Urine, Discharge, Flank Pain, Incontinence, Painful Urination, Urgency, Urinary Retention and Weak urinary stream. Musculoskeletal Present- Back Pain and Joint Pain. Not Present- Joint Swelling, Morning Stiffness, Muscle Pain, Muscle Weakness and Spasms. Neurological Not Present- Blackout spells, Difficulty with balance, Dizziness, Paralysis, Tremor and Weakness. Psychiatric Not Present- Insomnia.   Vitals  Weight: 154 lb Height: 65in Weight was reported by patient. Height was reported by patient. Body Surface Area: 1.79 m Body Mass Index: 25.63 kg/m Pulse: 62 (Regular)  Resp.: 12 (Unlabored)  BP: 116/62 (Sitting, Right Arm, Standard)   Physical Exam  General Mental Status -Alert, cooperative and good historian. General Appearance-pleasant, Not in acute distress. Orientation-Oriented X3. Build & Nutrition-Well nourished and Well developed.  Head and Neck Head-normocephalic, atraumatic  . Neck Global Assessment - supple, no bruit auscultated on the right, no bruit auscultated on the left.  Eye Pupil - Bilateral-Regular and Round. Motion - Bilateral-EOMI.  Chest and Lung Exam Auscultation Breath sounds - clear at anterior chest wall and clear at posterior chest wall. Adventitious sounds - No Adventitious sounds.  Cardiovascular Auscultation Rhythm - Regular rate and rhythm. Heart Sounds - S1 WNL and S2 WNL. Murmurs & Other Heart Sounds: Murmur 1 - Location - Aortic Area and Pulmonic Area. Timing - Mid-systolic. Grade - II/VI.  Abdomen Palpation/Percussion Tenderness - Abdomen is non-tender to palpation. Rigidity (guarding) - Abdomen is soft. Auscultation Auscultation of the abdomen reveals - Bowel sounds normal.  Male Genitourinary Note: Not done, not pertinent to present illness   Musculoskeletal Note: On exam, well developed male alert and oriented in no apparent distress. Evaluation of his hips show normal range of motion with no discomfort. His right knee shows no effusion. Significant varus deformity. Range is 5-125. Moderate crepitus on range of motion. Tenderness medial greater than lateral with no instability noted. Left knee no effusion. Slight varus. Range is about 5-130. Moderate crepitus on range of motion. Tender medial greater than lateral with no instability noted. Pulse, sensation, and motor intact both lower extremities.  RADIOGRAPHS: AP both knees and lateral both are reviewed and he has end stage arthritis medial and patellofemoral both knees, right worse than left.   Assessment & Plan  Primary osteoarthritis of both knees (715.16  M17.0) Note:Plan is for a Bilateral Total Knee Replacements by Dr. [name].  Plan is to look into Rehab versus Home.  PCP - Dr. Shirline Frees - Patient has been seen preoperatively and felt to be stable for surgery.  The patient does not have any contraindications and will receive TXA  (tranexamic acid) prior to surgery.  Signed electronically by Ok Edwards, III PA-C

## 2014-02-14 ENCOUNTER — Inpatient Hospital Stay (HOSPITAL_COMMUNITY): Payer: BC Managed Care – PPO | Admitting: Certified Registered Nurse Anesthetist

## 2014-02-14 ENCOUNTER — Encounter (HOSPITAL_COMMUNITY): Payer: BC Managed Care – PPO | Admitting: Certified Registered Nurse Anesthetist

## 2014-02-14 ENCOUNTER — Encounter (HOSPITAL_COMMUNITY): Payer: Self-pay | Admitting: Anesthesiology

## 2014-02-14 ENCOUNTER — Inpatient Hospital Stay (HOSPITAL_COMMUNITY)
Admission: RE | Admit: 2014-02-14 | Discharge: 2014-02-19 | DRG: 462 | Disposition: A | Discharge status: Rehabilitation Facility | Payer: BC Managed Care – PPO | Source: Ambulatory Visit | Attending: Orthopedic Surgery | Admitting: Orthopedic Surgery

## 2014-02-14 ENCOUNTER — Encounter (HOSPITAL_COMMUNITY)
Admission: RE | Disposition: A | Discharge status: Rehabilitation Facility | Payer: Self-pay | Source: Ambulatory Visit | Attending: Orthopedic Surgery

## 2014-02-14 DIAGNOSIS — Z87891 Personal history of nicotine dependence: Secondary | ICD-10-CM

## 2014-02-14 DIAGNOSIS — Z96659 Presence of unspecified artificial knee joint: Secondary | ICD-10-CM | POA: Diagnosis not present

## 2014-02-14 DIAGNOSIS — Z860100 Personal history of colon polyps, unspecified: Secondary | ICD-10-CM | POA: Diagnosis not present

## 2014-02-14 DIAGNOSIS — H9190 Unspecified hearing loss, unspecified ear: Secondary | ICD-10-CM | POA: Diagnosis present

## 2014-02-14 DIAGNOSIS — K219 Gastro-esophageal reflux disease without esophagitis: Secondary | ICD-10-CM | POA: Diagnosis present

## 2014-02-14 DIAGNOSIS — Z96653 Presence of artificial knee joint, bilateral: Secondary | ICD-10-CM

## 2014-02-14 DIAGNOSIS — I10 Essential (primary) hypertension: Secondary | ICD-10-CM | POA: Diagnosis present

## 2014-02-14 DIAGNOSIS — K2100 Gastro-esophageal reflux disease with esophagitis, without bleeding: Secondary | ICD-10-CM | POA: Diagnosis not present

## 2014-02-14 DIAGNOSIS — H9319 Tinnitus, unspecified ear: Secondary | ICD-10-CM | POA: Diagnosis present

## 2014-02-14 DIAGNOSIS — G8918 Other acute postprocedural pain: Secondary | ICD-10-CM | POA: Diagnosis not present

## 2014-02-14 DIAGNOSIS — I959 Hypotension, unspecified: Secondary | ICD-10-CM | POA: Diagnosis not present

## 2014-02-14 DIAGNOSIS — Z6825 Body mass index (BMI) 25.0-25.9, adult: Secondary | ICD-10-CM

## 2014-02-14 DIAGNOSIS — Z79899 Other long term (current) drug therapy: Secondary | ICD-10-CM

## 2014-02-14 DIAGNOSIS — M179 Osteoarthritis of knee, unspecified: Secondary | ICD-10-CM | POA: Diagnosis present

## 2014-02-14 DIAGNOSIS — M1710 Unilateral primary osteoarthritis, unspecified knee: Secondary | ICD-10-CM | POA: Diagnosis not present

## 2014-02-14 DIAGNOSIS — Z5189 Encounter for other specified aftercare: Secondary | ICD-10-CM | POA: Diagnosis not present

## 2014-02-14 DIAGNOSIS — D62 Acute posthemorrhagic anemia: Secondary | ICD-10-CM | POA: Diagnosis not present

## 2014-02-14 DIAGNOSIS — M25569 Pain in unspecified knee: Secondary | ICD-10-CM | POA: Diagnosis not present

## 2014-02-14 HISTORY — PX: TOTAL KNEE ARTHROPLASTY: SHX125

## 2014-02-14 LAB — TYPE AND SCREEN
ABO/RH(D): O POS
Antibody Screen: NEGATIVE

## 2014-02-14 LAB — ABO/RH: ABO/RH(D): O POS

## 2014-02-14 SURGERY — ARTHROPLASTY, KNEE, TOTAL
Anesthesia: Epidural | Site: Knee | Laterality: Bilateral

## 2014-02-14 MED ORDER — ACETAMINOPHEN 10 MG/ML IV SOLN
1000.0000 mg | Freq: Once | INTRAVENOUS | Status: AC
  Administered 2014-02-14: 1000 mg via INTRAVENOUS
  Filled 2014-02-14: qty 100

## 2014-02-14 MED ORDER — ACETAMINOPHEN 500 MG PO TABS
1000.0000 mg | ORAL_TABLET | Freq: Four times a day (QID) | ORAL | Status: AC
  Administered 2014-02-14 – 2014-02-15 (×4): 1000 mg via ORAL
  Filled 2014-02-14 (×5): qty 2

## 2014-02-14 MED ORDER — MENTHOL 3 MG MT LOZG
1.0000 | LOZENGE | OROMUCOSAL | Status: DC | PRN
  Administered 2014-02-14: 3 mg via ORAL
  Filled 2014-02-14: qty 9

## 2014-02-14 MED ORDER — CEFAZOLIN SODIUM-DEXTROSE 2-3 GM-% IV SOLR
INTRAVENOUS | Status: AC
  Filled 2014-02-14: qty 50

## 2014-02-14 MED ORDER — FENTANYL CITRATE 0.05 MG/ML IJ SOLN
INTRAMUSCULAR | Status: DC | PRN
  Administered 2014-02-14: 50 ug via INTRAVENOUS
  Administered 2014-02-14 (×2): 25 ug via INTRAVENOUS

## 2014-02-14 MED ORDER — DEXAMETHASONE SODIUM PHOSPHATE 10 MG/ML IJ SOLN
10.0000 mg | Freq: Every day | INTRAMUSCULAR | Status: AC
  Filled 2014-02-14: qty 1

## 2014-02-14 MED ORDER — BUPIVACAINE IN DEXTROSE 0.75-8.25 % IT SOLN
INTRATHECAL | Status: DC | PRN
  Administered 2014-02-14: 2 mL via INTRATHECAL

## 2014-02-14 MED ORDER — OXYCODONE HCL 5 MG PO TABS
5.0000 mg | ORAL_TABLET | ORAL | Status: DC | PRN
  Administered 2014-02-15: 5 mg via ORAL
  Administered 2014-02-16: 15 mg via ORAL
  Administered 2014-02-16: 5 mg via ORAL
  Administered 2014-02-16 (×3): 10 mg via ORAL
  Administered 2014-02-16: 5 mg via ORAL
  Administered 2014-02-17 – 2014-02-19 (×12): 10 mg via ORAL
  Filled 2014-02-14 (×4): qty 2
  Filled 2014-02-14: qty 3
  Filled 2014-02-14 (×5): qty 2
  Filled 2014-02-14: qty 1
  Filled 2014-02-14 (×2): qty 2
  Filled 2014-02-14: qty 1
  Filled 2014-02-14 (×3): qty 2
  Filled 2014-02-14: qty 1
  Filled 2014-02-14: qty 2

## 2014-02-14 MED ORDER — PROPOFOL 10 MG/ML IV BOLUS
INTRAVENOUS | Status: DC | PRN
  Administered 2014-02-14: 40 mg via INTRAVENOUS
  Administered 2014-02-14 (×2): 20 mg via INTRAVENOUS

## 2014-02-14 MED ORDER — PROMETHAZINE HCL 25 MG/ML IJ SOLN: 6.2500 mg | INTRAMUSCULAR | Status: DC | PRN

## 2014-02-14 MED ORDER — POLYETHYLENE GLYCOL 3350 17 G PO PACK
17.0000 g | PACK | Freq: Every day | ORAL | Status: DC | PRN
  Administered 2014-02-18 (×2): 17 g via ORAL

## 2014-02-14 MED ORDER — DIPHENHYDRAMINE HCL 25 MG PO CAPS: 25.0000 mg | ORAL_CAPSULE | ORAL | Status: DC | PRN

## 2014-02-14 MED ORDER — PHENOL 1.4 % MT LIQD: 1.0000 | OROMUCOSAL | Status: DC | PRN

## 2014-02-14 MED ORDER — MORPHINE SULFATE 2 MG/ML IJ SOLN
1.0000 mg | INTRAMUSCULAR | Status: DC | PRN
  Administered 2014-02-16: 2 mg via INTRAVENOUS
  Administered 2014-02-16: 1 mg via INTRAVENOUS
  Filled 2014-02-14 (×2): qty 1

## 2014-02-14 MED ORDER — METOCLOPRAMIDE HCL 10 MG PO TABS: 5.0000 mg | ORAL_TABLET | Freq: Three times a day (TID) | ORAL | Status: DC | PRN

## 2014-02-14 MED ORDER — ONDANSETRON HCL 4 MG/2ML IJ SOLN: 4.0000 mg | Freq: Three times a day (TID) | INTRAMUSCULAR | Status: DC | PRN

## 2014-02-14 MED ORDER — 0.9 % SODIUM CHLORIDE (POUR BTL) OPTIME
TOPICAL | Status: DC | PRN
  Administered 2014-02-14: 1000 mL

## 2014-02-14 MED ORDER — MIDAZOLAM HCL 2 MG/2ML IJ SOLN
INTRAMUSCULAR | Status: AC
  Filled 2014-02-14: qty 2

## 2014-02-14 MED ORDER — ATROPINE SULFATE 0.4 MG/ML IJ SOLN
INTRAMUSCULAR | Status: DC | PRN
  Administered 2014-02-14: 0.4 mg via INTRAVENOUS

## 2014-02-14 MED ORDER — PROPOFOL 10 MG/ML IV BOLUS
INTRAVENOUS | Status: AC
Start: 2014-02-14 — End: 2014-02-14
  Filled 2014-02-14: qty 20

## 2014-02-14 MED ORDER — PROPOFOL 10 MG/ML IV BOLUS
INTRAVENOUS | Status: AC
  Filled 2014-02-14: qty 20

## 2014-02-14 MED ORDER — SODIUM CHLORIDE 0.9 % IV SOLN
INTRAVENOUS | Status: DC
  Administered 2014-02-14: 100 mL/h via INTRAVENOUS
  Administered 2014-02-15 – 2014-02-16 (×5): via INTRAVENOUS
  Administered 2014-02-18: 500 mL via INTRAVENOUS

## 2014-02-14 MED ORDER — FLEET ENEMA 7-19 GM/118ML RE ENEM: 1.0000 | ENEMA | Freq: Once | RECTAL | Status: AC | PRN

## 2014-02-14 MED ORDER — NALOXONE HCL 0.4 MG/ML IJ SOLN: 0.4000 mg | INTRAMUSCULAR | Status: DC | PRN

## 2014-02-14 MED ORDER — SCOPOLAMINE 1 MG/3DAYS TD PT72
1.0000 | MEDICATED_PATCH | Freq: Once | TRANSDERMAL | Status: AC
  Administered 2014-02-14: 1.5 mg via TRANSDERMAL
  Filled 2014-02-14: qty 1

## 2014-02-14 MED ORDER — FENTANYL CITRATE 0.05 MG/ML IJ SOLN
INTRAMUSCULAR | Status: AC
  Filled 2014-02-14: qty 5

## 2014-02-14 MED ORDER — BISACODYL 10 MG RE SUPP: 10.0000 mg | Freq: Every day | RECTAL | Status: DC | PRN

## 2014-02-14 MED ORDER — ACETAMINOPHEN 650 MG RE SUPP: 650.0000 mg | Freq: Four times a day (QID) | RECTAL | Status: DC | PRN

## 2014-02-14 MED ORDER — TRAMADOL HCL 50 MG PO TABS
50.0000 mg | ORAL_TABLET | Freq: Four times a day (QID) | ORAL | Status: DC | PRN
  Administered 2014-02-15 – 2014-02-16 (×4): 100 mg via ORAL
  Filled 2014-02-14 (×4): qty 2

## 2014-02-14 MED ORDER — METHOCARBAMOL 1000 MG/10ML IJ SOLN
500.0000 mg | Freq: Four times a day (QID) | INTRAVENOUS | Status: DC | PRN
  Filled 2014-02-14: qty 5

## 2014-02-14 MED ORDER — SODIUM CHLORIDE 0.9 % IJ SOLN
3.0000 mL | INTRAMUSCULAR | Status: DC | PRN
Start: 2014-02-14 — End: 2014-02-19

## 2014-02-14 MED ORDER — METHOCARBAMOL 500 MG PO TABS
500.0000 mg | ORAL_TABLET | Freq: Four times a day (QID) | ORAL | Status: DC | PRN
  Administered 2014-02-15 – 2014-02-18 (×11): 500 mg via ORAL
  Filled 2014-02-14 (×11): qty 1

## 2014-02-14 MED ORDER — NALOXONE HCL 1 MG/ML IJ SOLN
1.0000 ug/kg/h | INTRAVENOUS | Status: DC | PRN
  Filled 2014-02-14: qty 2

## 2014-02-14 MED ORDER — LACTATED RINGERS IV SOLN
INTRAVENOUS | Status: DC
  Administered 2014-02-14: 13:00:00 via INTRAVENOUS
  Administered 2014-02-14: 1000 mL via INTRAVENOUS
  Administered 2014-02-14: 12:00:00 via INTRAVENOUS

## 2014-02-14 MED ORDER — SODIUM CHLORIDE 0.9 % IR SOLN
Status: DC | PRN
  Administered 2014-02-14: 3000 mL

## 2014-02-14 MED ORDER — CEFAZOLIN SODIUM-DEXTROSE 2-3 GM-% IV SOLR
2.0000 g | INTRAVENOUS | Status: AC
  Administered 2014-02-14: 2 g via INTRAVENOUS

## 2014-02-14 MED ORDER — BUPIVACAINE-EPINEPHRINE (PF) 0.25% -1:200000 IJ SOLN
INTRAMUSCULAR | Status: AC
  Filled 2014-02-14: qty 30

## 2014-02-14 MED ORDER — ROPIVACAINE HCL 2 MG/ML IJ SOLN
15.0000 mL/h | INTRAMUSCULAR | Status: DC
  Administered 2014-02-14 – 2014-02-15 (×2): 15 mL/h via EPIDURAL
  Filled 2014-02-14 (×6): qty 200

## 2014-02-14 MED ORDER — ACETAMINOPHEN 325 MG PO TABS
650.0000 mg | ORAL_TABLET | Freq: Four times a day (QID) | ORAL | Status: DC | PRN
  Administered 2014-02-15 – 2014-02-19 (×3): 650 mg via ORAL
  Filled 2014-02-14 (×3): qty 2

## 2014-02-14 MED ORDER — TRANEXAMIC ACID 100 MG/ML IV SOLN
1000.0000 mg | INTRAVENOUS | Status: AC
  Administered 2014-02-14: 1000 mg via INTRAVENOUS
  Filled 2014-02-14: qty 10

## 2014-02-14 MED ORDER — DEXAMETHASONE 4 MG PO TABS
10.0000 mg | ORAL_TABLET | Freq: Every day | ORAL | Status: AC
  Administered 2014-02-15: 10 mg via ORAL
  Filled 2014-02-14: qty 1

## 2014-02-14 MED ORDER — METOCLOPRAMIDE HCL 5 MG/ML IJ SOLN: 5.0000 mg | Freq: Three times a day (TID) | INTRAMUSCULAR | Status: DC | PRN

## 2014-02-14 MED ORDER — ONDANSETRON HCL 4 MG/2ML IJ SOLN
INTRAMUSCULAR | Status: DC | PRN
  Administered 2014-02-14: 4 mg via INTRAVENOUS

## 2014-02-14 MED ORDER — DOCUSATE SODIUM 100 MG PO CAPS
100.0000 mg | ORAL_CAPSULE | Freq: Two times a day (BID) | ORAL | Status: DC
  Administered 2014-02-14 – 2014-02-19 (×9): 100 mg via ORAL

## 2014-02-14 MED ORDER — PANTOPRAZOLE SODIUM 40 MG PO TBEC
40.0000 mg | DELAYED_RELEASE_TABLET | Freq: Every day | ORAL | Status: DC
  Administered 2014-02-15 – 2014-02-19 (×5): 40 mg via ORAL
  Filled 2014-02-14 (×5): qty 1

## 2014-02-14 MED ORDER — METOCLOPRAMIDE HCL 5 MG/ML IJ SOLN: 10.0000 mg | Freq: Three times a day (TID) | INTRAMUSCULAR | Status: DC | PRN

## 2014-02-14 MED ORDER — SODIUM CHLORIDE 0.9 % IV SOLN: INTRAVENOUS | Status: DC

## 2014-02-14 MED ORDER — ONDANSETRON HCL 4 MG/2ML IJ SOLN: 4.0000 mg | Freq: Four times a day (QID) | INTRAMUSCULAR | Status: DC | PRN

## 2014-02-14 MED ORDER — PROPOFOL INFUSION 10 MG/ML OPTIME
INTRAVENOUS | Status: DC | PRN
  Administered 2014-02-14: 25 ug/kg/min via INTRAVENOUS

## 2014-02-14 MED ORDER — DEXAMETHASONE SODIUM PHOSPHATE 10 MG/ML IJ SOLN
10.0000 mg | Freq: Once | INTRAMUSCULAR | Status: AC
  Administered 2014-02-14: 10 mg via INTRAVENOUS

## 2014-02-14 MED ORDER — DIPHENHYDRAMINE HCL 12.5 MG/5ML PO ELIX
12.5000 mg | ORAL_SOLUTION | ORAL | Status: DC | PRN
  Administered 2014-02-14: 25 mg via ORAL
  Filled 2014-02-14: qty 10

## 2014-02-14 MED ORDER — EPHEDRINE SULFATE 50 MG/ML IJ SOLN
INTRAMUSCULAR | Status: DC | PRN
  Administered 2014-02-14: 5 mg via INTRAVENOUS
  Administered 2014-02-14 (×2): 10 mg via INTRAVENOUS
  Administered 2014-02-14: 5 mg via INTRAVENOUS

## 2014-02-14 MED ORDER — ONDANSETRON HCL 4 MG PO TABS: 4.0000 mg | ORAL_TABLET | Freq: Four times a day (QID) | ORAL | Status: DC | PRN

## 2014-02-14 MED ORDER — HYDROMORPHONE HCL PF 1 MG/ML IJ SOLN
0.2500 mg | INTRAMUSCULAR | Status: DC | PRN
  Administered 2014-02-14: 0.5 mg via INTRAVENOUS

## 2014-02-14 MED ORDER — DEXAMETHASONE SODIUM PHOSPHATE 10 MG/ML IJ SOLN
INTRAMUSCULAR | Status: AC
  Filled 2014-02-14: qty 1

## 2014-02-14 MED ORDER — DIPHENHYDRAMINE HCL 50 MG/ML IJ SOLN: 25.0000 mg | INTRAMUSCULAR | Status: DC | PRN

## 2014-02-14 MED ORDER — HYDROMORPHONE HCL PF 1 MG/ML IJ SOLN
INTRAMUSCULAR | Status: AC
  Filled 2014-02-14: qty 1

## 2014-02-14 MED ORDER — CEFAZOLIN SODIUM-DEXTROSE 2-3 GM-% IV SOLR
2.0000 g | Freq: Four times a day (QID) | INTRAVENOUS | Status: AC
  Administered 2014-02-14 – 2014-02-15 (×2): 2 g via INTRAVENOUS
  Filled 2014-02-14 (×2): qty 50

## 2014-02-14 MED ORDER — MIDAZOLAM HCL 5 MG/5ML IJ SOLN
INTRAMUSCULAR | Status: DC | PRN
  Administered 2014-02-14: 2 mg via INTRAVENOUS

## 2014-02-14 MED ORDER — DIPHENHYDRAMINE HCL 50 MG/ML IJ SOLN
12.5000 mg | INTRAMUSCULAR | Status: DC | PRN
  Administered 2014-02-15: 12.5 mg via INTRAVENOUS
  Filled 2014-02-14: qty 1

## 2014-02-14 MED ORDER — ONDANSETRON HCL 4 MG/2ML IJ SOLN
INTRAMUSCULAR | Status: AC
  Filled 2014-02-14: qty 2

## 2014-02-14 MED ORDER — MEPERIDINE HCL 50 MG/ML IJ SOLN: 6.2500 mg | INTRAMUSCULAR | Status: DC | PRN

## 2014-02-14 SURGICAL SUPPLY — 55 items
BAG SPEC THK2 15X12 ZIP CLS (MISCELLANEOUS)
BAG ZIPLOCK 12X15 (MISCELLANEOUS) ×1 IMPLANT
BANDAGE ELASTIC 6 VELCRO ST LF (GAUZE/BANDAGES/DRESSINGS) ×3 IMPLANT
BANDAGE ESMARK 6X9 LF (GAUZE/BANDAGES/DRESSINGS) ×1 IMPLANT
BLADE SAG 18X100X1.27 (BLADE) ×3 IMPLANT
BLADE SAW SGTL 11.0X1.19X90.0M (BLADE) ×3 IMPLANT
BNDG CMPR 9X6 STRL LF SNTH (GAUZE/BANDAGES/DRESSINGS) ×2
BNDG COHESIVE 6X5 TAN STRL LF (GAUZE/BANDAGES/DRESSINGS) ×1 IMPLANT
BNDG ESMARK 6X9 LF (GAUZE/BANDAGES/DRESSINGS) ×4
BOWL SMART MIX CTS (DISPOSABLE) ×3 IMPLANT
CAP KNEE ATTUNE RP ×2 IMPLANT
CEMENT HV SMART SET (Cement) ×6 IMPLANT
CUFF TOURN SGL QUICK 34 (TOURNIQUET CUFF) ×4
CUFF TRNQT CYL 34X4X40X1 (TOURNIQUET CUFF) ×1 IMPLANT
DRAPE EXTREMITY BILATERAL (DRAPE) ×1 IMPLANT
DRAPE INCISE IOBAN 66X45 STRL (DRAPES) ×1 IMPLANT
DRAPE POUCH INSTRU U-SHP 10X18 (DRAPES) ×2 IMPLANT
DRAPE U-SHAPE 47X51 STRL (DRAPES) ×4 IMPLANT
DRSG ADAPTIC 3X8 NADH LF (GAUZE/BANDAGES/DRESSINGS) ×3 IMPLANT
DRSG PAD ABDOMINAL 8X10 ST (GAUZE/BANDAGES/DRESSINGS) ×3 IMPLANT
DURAPREP 26ML APPLICATOR (WOUND CARE) ×2 IMPLANT
ELECT REM PT RETURN 9FT ADLT (ELECTROSURGICAL) ×2
ELECTRODE REM PT RTRN 9FT ADLT (ELECTROSURGICAL) ×1 IMPLANT
EVACUATOR 1/8 PVC DRAIN (DRAIN) ×3 IMPLANT
FACESHIELD WRAPAROUND (MASK) ×14 IMPLANT
FACESHIELD WRAPAROUND OR TEAM (MASK) ×5 IMPLANT
GAUZE SPONGE 4X4 12PLY STRL (GAUZE/BANDAGES/DRESSINGS) ×3 IMPLANT
GLOVE BIO SURGEON STRL SZ7.5 (GLOVE) ×2 IMPLANT
GLOVE BIO SURGEON STRL SZ8 (GLOVE) ×3 IMPLANT
GLOVE BIOGEL PI IND STRL 6.5 (GLOVE) IMPLANT
GLOVE BIOGEL PI IND STRL 8 (GLOVE) ×1 IMPLANT
GLOVE BIOGEL PI INDICATOR 6.5 (GLOVE) ×1
GLOVE BIOGEL PI INDICATOR 8 (GLOVE) ×2
GOWN STRL REUS W/TWL LRG LVL3 (GOWN DISPOSABLE) ×2 IMPLANT
GOWN STRL REUS W/TWL XL LVL3 (GOWN DISPOSABLE) ×3 IMPLANT
HANDPIECE INTERPULSE COAX TIP (DISPOSABLE) ×2
IMMOBILIZER KNEE 20 (SOFTGOODS) ×4
IMMOBILIZER KNEE 20 THIGH 36 (SOFTGOODS) ×1 IMPLANT
KIT BASIN OR (CUSTOM PROCEDURE TRAY) ×2 IMPLANT
MANIFOLD NEPTUNE II (INSTRUMENTS) ×2 IMPLANT
PACK TOTAL JOINT (CUSTOM PROCEDURE TRAY) ×2 IMPLANT
PADDING CAST COTTON 6X4 STRL (CAST SUPPLIES) ×8 IMPLANT
SET HNDPC FAN SPRY TIP SCT (DISPOSABLE) ×1 IMPLANT
STOCKINETTE 8 INCH (MISCELLANEOUS) ×1 IMPLANT
STRIP CLOSURE SKIN 1/2X4 (GAUZE/BANDAGES/DRESSINGS) ×6 IMPLANT
SUCTION FRAZIER 12FR DISP (SUCTIONS) ×2 IMPLANT
SUT MNCRL AB 4-0 PS2 18 (SUTURE) ×3 IMPLANT
SUT VIC AB 2-0 CT1 27 (SUTURE) ×12
SUT VIC AB 2-0 CT1 TAPERPNT 27 (SUTURE) ×3 IMPLANT
SUT VLOC 180 0 24IN GS25 (SUTURE) ×3 IMPLANT
TOWEL OR 17X26 10 PK STRL BLUE (TOWEL DISPOSABLE) ×2 IMPLANT
TOWEL OR NON WOVEN STRL DISP B (DISPOSABLE) ×1 IMPLANT
TRAY FOLEY CATH 16FRSI W/METER (SET/KITS/TRAYS/PACK) ×1 IMPLANT
WATER STERILE IRR 1500ML POUR (IV SOLUTION) ×2 IMPLANT
WRAP KNEE MAXI GEL POST OP (GAUZE/BANDAGES/DRESSINGS) ×2 IMPLANT

## 2014-02-14 NOTE — Anesthesia Procedure Notes (Addendum)
Epidural Patient location during procedure: holding area  Staffing Anesthesiologist: Salley Scarlet Performed by: anesthesiologist   Preanesthetic Checklist Completed: patient identified, site marked, surgical consent, pre-op evaluation, timeout performed, IV checked, risks and benefits discussed, monitors and equipment checked and post-op pain management  Epidural Patient position: sitting Prep: Betadine Patient monitoring: heart rate, continuous pulse ox and blood pressure Approach: midline Location: L3-L4 Injection technique: LOR air  Needle:  Needle type: Hustead  Needle gauge: 18 G Needle length: 9 cm and 9 Catheter type: closed end flexible Catheter size: 20 Guage Test dose: negative and 1.5% lidocaine  Additional Notes Test dose 1.5% Lidocaine with epi 1:200,000  Patient tolerated the insertion well without complications.  No paresthesia. No heme in CSF. Patient in meaningful verbal contact throughout procedure. Reason for block:post-op pain management  Spinal  Patient location during procedure: OR Staffing Anesthesiologist: Salley Scarlet Performed by: anesthesiologist  Preanesthetic Checklist Completed: patient identified, site marked, surgical consent, pre-op evaluation, timeout performed, IV checked, risks and benefits discussed and monitors and equipment checked Spinal Block Patient position: sitting Prep: Betadine Patient monitoring: heart rate, continuous pulse ox and blood pressure Approach: midline Location: L3-4 Injection technique: single-shot Needle Needle type: [place]  Needle gauge: 27 G Needle length: 9 cm Assessment Sensory level: T6 Additional Notes Expiration date of kit checked and confirmed. Patient tolerated procedure well, without complications.

## 2014-02-14 NOTE — H&P (View-Only) (Signed)
James Atkinson DOB: March 17, 1946 Married / Language: English / Race: White Male Date of Admission:  02/14/2014 Chief Complaint:  Bilateral Knee Pain History of Present Illness  The patient is a 68 year old male who comes in for a preoperative History and Physical. The patient is scheduled for a bilateral total knee arthroplasty to be performed by Dr. Dione Plover. [name], MD at [facility] on 02/14/2014. The patient is a 68 year old male who presents for follow up of their knee. The patient is being followed for their bilateral knee pain and osteoarthritis. Symptoms reported today include: pain. The patient feels that they are doing poorly and report their pain level to be moderate to severe. The following medication has been used for pain control: antiinflammatory medication (ibuprofen). Mr. [name] comes back in today to discuss bilateral knee replacements. He saw [name], [facility] last visit and was found to have bialteral knee osteoarthritis. The right knee is already bone on bone and the left knee is very close behind. 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 He wants to get them replaced. It was recommended to undergo bilateral knees at his last visit and came back in to discuss this with Dr. [name]. He would like to look into inpatient rehab following surgery as not to put any stress on his wife during his reahabilitation. He unfortunately has had progressively worsening pain and dysfunction over time. The knees are bothering him at all times. It is limiting what he can and cannot do. He saw [name] last week and bilateral total knee arthroplasty was discussed. He is here today to discuss that and to go over details. He has not had injections recently but injections at this stage are unlikely to impact him given the significant arthritis and significant dysfunction. They have been treated conservatively in the  past for the above stated problem and despite conservative measures, they continue to have progressive pain and severe functional limitations and dysfunction. They have failed non-operative management including home exercise, medications, and injections. It is felt that they would benefit from undergoing bilateral total joint replacements. Risks and benefits of the procedure have been discussed with the patient and they elect to proceed with surgery. There are no active contraindications to surgery such as ongoing infection or rapidly progressive neurological disease.  Allergies  Cipro *Fluoroquinolones** Nausea. Intolerance  Problem List/Past Medical  Primary osteoarthritis of both knees (715.16  M17.0) Hypercholesterolemia High blood pressure Gastroesophageal Reflux Disease Shingles Pneumonia Hemorrhoids Measles Mumps   Family History Cancer Mother, Paternal Grandfather. Chronic Obstructive Lung Disease Father. Hypertension Father.  Social History  Exercise Exercises rarely; does other Former drinker 10/18/2013: In the past drank Living situation live with spouse Children 2 Current work status working full time Number of flights of stairs before winded 2-3 Tobacco / smoke exposure 10/18/2013: yes outdoors only Tobacco use Former smoker. 10/18/2013: smoke(d) 1 pack(s) per day Marital status married No history of drug/alcohol rehab Not under pain contract Post-Surgical Plans Plan to look into Rehab Advance Directives Living Will  Medication History Lisinopril (20MG  Tablet, Oral) Active. Multivitamins (Oral) Active. Ibuprofen (200MG  Tablet, Oral) Active. Fish Oil Burp-Less (500MG  Capsule, Oral) Active. Cinnamon (Oral) Specific dose unknown - Active. Iron Supplement (Oral) Specific dose unknown - Active. Vinegar Active. Betaprostate Active.  Past Surgical History  Straighten Nasal Septum Tonsillectomy Colon Polyp Removal -  Colonoscopy Inguinal Hernia Repair open: bilateral Rotator Cuff Repair  left Arthroscopy of Knee left  Review of Systems General Present- Weight Loss (planned weight loss for cholesterol control). Not Present- Chills, Fatigue, Fever, Memory Loss, Night Sweats and Weight Gain. Skin Not Present- Eczema, Hives, Itching, Lesions and Rash. HEENT Present- Hearing Loss and Tinnitus. Not Present- Dentures, Double Vision, Headache and Visual Loss. Respiratory Not Present- Allergies, Chronic Cough, Coughing up blood, Shortness of breath at rest and Shortness of breath with exertion. Cardiovascular Not Present- Chest Pain, Difficulty Breathing Lying Down, Murmur, Palpitations, Racing/skipping heartbeats and Swelling. Gastrointestinal Not Present- Abdominal Pain, Bloody Stool, Constipation, Diarrhea, Difficulty Swallowing, Heartburn, Jaundice, Loss of appetitie, Nausea and Vomiting. Male Genitourinary Present- Urinary frequency and Urinating at Night. Not Present- Blood in Urine, Discharge, Flank Pain, Incontinence, Painful Urination, Urgency, Urinary Retention and Weak urinary stream. Musculoskeletal Present- Back Pain and Joint Pain. Not Present- Joint Swelling, Morning Stiffness, Muscle Pain, Muscle Weakness and Spasms. Neurological Not Present- Blackout spells, Difficulty with balance, Dizziness, Paralysis, Tremor and Weakness. Psychiatric Not Present- Insomnia.   Vitals  Weight: 154 lb Height: 65in Weight was reported by patient. Height was reported by patient. Body Surface Area: 1.79 m Body Mass Index: 25.63 kg/m Pulse: 62 (Regular)  Resp.: 12 (Unlabored)  BP: 116/62 (Sitting, Right Arm, Standard)   Physical Exam  General Mental Status -Alert, cooperative and good historian. General Appearance-pleasant, Not in acute distress. Orientation-Oriented X3. Build & Nutrition-Well nourished and Well developed.  Head and Neck Head-normocephalic, atraumatic  . Neck Global Assessment - supple, no bruit auscultated on the right, no bruit auscultated on the left.  Eye Pupil - Bilateral-Regular and Round. Motion - Bilateral-EOMI.  Chest and Lung Exam Auscultation Breath sounds - clear at anterior chest wall and clear at posterior chest wall. Adventitious sounds - No Adventitious sounds.  Cardiovascular Auscultation Rhythm - Regular rate and rhythm. Heart Sounds - S1 WNL and S2 WNL. Murmurs & Other Heart Sounds: Murmur 1 - Location - Aortic Area and Pulmonic Area. Timing - Mid-systolic. Grade - II/VI.  Abdomen Palpation/Percussion Tenderness - Abdomen is non-tender to palpation. Rigidity (guarding) - Abdomen is soft. Auscultation Auscultation of the abdomen reveals - Bowel sounds normal.  Male Genitourinary Note: Not done, not pertinent to present illness   Musculoskeletal Note: On exam, well developed male alert and oriented in no apparent distress. Evaluation of his hips show normal range of motion with no discomfort. His right knee shows no effusion. Significant varus deformity. Range is 5-125. Moderate crepitus on range of motion. Tenderness medial greater than lateral with no instability noted. Left knee no effusion. Slight varus. Range is about 5-130. Moderate crepitus on range of motion. Tender medial greater than lateral with no instability noted. Pulse, sensation, and motor intact both lower extremities.  RADIOGRAPHS: AP both knees and lateral both are reviewed and he has end stage arthritis medial and patellofemoral both knees, right worse than left.   Assessment & Plan  Primary osteoarthritis of both knees (715.16  M17.0) Note:Plan is for a Bilateral Total Knee Replacements by Dr. [name].  Plan is to look into Rehab versus Home.  PCP - Dr. Shirline Frees - Patient has been seen preoperatively and felt to be stable for surgery.  The patient does not have any contraindications and will receive TXA  (tranexamic acid) prior to surgery.  Signed electronically by Ok Edwards, III PA-C

## 2014-02-14 NOTE — Anesthesia Postprocedure Evaluation (Signed)
  Anesthesia Post-op Note  Patient: Marty Heck  Procedure(s) Performed: Procedure(s) (LRB): BILATERAL TOTAL KNEE ARTHROPLASTY (Bilateral)  Patient Location: PACU  Anesthesia Type: Epidural and spinal and MAC  Level of Consciousness: awake and alert   Airway and Oxygen Therapy: Patient Spontanous Breathing  Post-op Pain: mild  Post-op Assessment: Post-op Vital signs reviewed, Patient's Cardiovascular Status Stable, Respiratory Function Stable, Patent Airway and No signs of Nausea or vomiting  Last Vitals:  Filed Vitals:   02/14/14 1510  BP: 111/58  Pulse: 54  Temp: 36.4 C  Resp: 15    Post-op Vital Signs: stable   Complications: No apparent anesthesia complications. Moving both legs in PACU. Comfortable. Epidural orders and precautions in place.

## 2014-02-14 NOTE — Transfer of Care (Signed)
Immediate Anesthesia Transfer of Care Note  Patient: James Atkinson  Procedure(s) Performed: Procedure(s) (LRB): BILATERAL TOTAL KNEE ARTHROPLASTY (Bilateral)  Patient Location: PACU  Anesthesia Type: MAC combined with regional for post-op pain  Level of Consciousness: awake, patient cooperative and responds to stimulation  Airway & Oxygen Therapy: Patient Spontanous Breathing and Patient connected to face mask oxgen  Post-op Assessment: Report given to PACU RN and Post -op Vital signs reviewed and stable  Post vital signs: Reviewed and stable  Complications: No apparent anesthesia complications

## 2014-02-14 NOTE — Interval H&P Note (Signed)
History and Physical Interval Note:  02/14/2014 10:27 AM  James Atkinson  has presented today for surgery, with the diagnosis of BILATERAL OA OF KNEE  The various methods of treatment have been discussed with the patient and family. After consideration of risks, benefits and other options for treatment, the patient has consented to  Procedure(s): BILATERAL TOTAL KNEE ARTHROPLASTY (Bilateral) as a surgical intervention .  The patient's history has been reviewed, patient examined, no change in status, stable for surgery.  I have reviewed the patient's chart and labs.  Questions were answered to the patient's satisfaction.     Gearlean Alf

## 2014-02-14 NOTE — Op Note (Signed)
Pre-operative diagnosis- Osteoarthritis  Bilateral knee(s)  Post-operative diagnosis- Osteoarthritis Bilateral knee(s)  Procedure-  Bilateral  Total Knee Arthroplasty  Surgeon- Dione Plover. Brigit Doke, MD  Assistant- Arlee Muslim, PA-C   Anesthesia-  Spinal and Epidural  EBL-* No blood loss amount entered *   Drains Hemovac x 1 each side  Tourniquet time-  Total Tourniquet Time Documented: Thigh (Left) - 32 minutes Total: Thigh (Left) - 32 minutes  Thigh (Right) - 33 minutes Total: Thigh (Right) - 33 minutes     Complications- None  Condition-PACU - hemodynamically stable.   Brief Clinical Note  James Atkinson is a 68 y.o. year old male with end stage OA of both knees with progressively worsening pain and dysfunction. He has constant pain, with activity and at rest and significant functional deficits with difficulties even with ADLs. He has had extensive non-op management including analgesics and home exercise program, but remains in significant pain with significant dysfunction. It was felt that injections would be useless given his severe varus deformity and functional limitations. We discussed replacing both knees in the same setting versus one at a time including procedure, risks, potential complications, rehab course, and pros and cons associated with each and the patient elects to do both knees at the same time. He presents now for right Total Knee Arthroplasty.     Procedure in detail---   The patient is brought into the operating room and positioned supine on the operating table. After successful administration of  Spinal and Epidural,   a tourniquet is placed high on the  Bilateral thigh(s) and the lower extremities are prepped and draped in the usual sterile fashion. Time out is performed by the operating team and then the  Right lower extremity is wrapped in Esmarch, knee flexed and the tourniquet inflated to 300 mmHg.       A midline incision is made with a ten blade through  the subcutaneous tissue to the level of the extensor mechanism. A fresh blade is used to make a medial parapatellar arthrotomy. Soft tissue over the proximal medial tibia is subperiosteally elevated to the joint line with a knife and into the semimembranosus bursa with a Cobb elevator. Soft tissue over the proximal lateral tibia is elevated with attention being paid to avoiding the patellar tendon on the tibial tubercle. The patella is everted, knee flexed 90 degrees and the ACL and PCL are removed. Findings are bone on bone medial and patellofemoral with large global osteophytes.        The drill is used to create a starting hole in the distal femur and the canal is thoroughly irrigated with sterile saline to remove the fatty contents. The 5 degree Right  valgus alignment guide is placed into the femoral canal and the distal femoral cutting block is pinned to remove 9 mm off the distal femur. Resection is made with an oscillating saw.      The tibia is subluxed forward and the menisci are removed. The extramedullary alignment guide is placed referencing proximally at the medial aspect of the tibial tubercle and distally along the second metatarsal axis and tibial crest. The block is pinned to remove 63mm off the more deficient medial  side. Resection is made with an oscillating saw. Size 7is the most appropriate size for the tibia and the proximal tibia is prepared with the modular drill and keel punch for that size.      The femoral sizing guide is placed and size 7 is most appropriate.  Rotation is marked off the epicondylar axis and confirmed by creating a rectangular flexion gap at 90 degrees. The size 7 cutting block is pinned in this rotation and the anterior, posterior and chamfer cuts are made with the oscillating saw. The intercondylar block is then placed and that cut is made.      Trial size 7 tibial component, trial size 7 posterior stabilized femur and a 8  mm posterior stabilized rotating platform  insert trial is placed. Full extension is achieved with excellent varus/valgus and anterior/posterior balance throughout full range of motion. The patella is everted and thickness measured to be 25  mm. Free hand resection is taken to 14 mm, a 41 template is placed, lug holes are drilled, trial patella is placed, and it tracks normally. Osteophytes are removed off the posterior femur with the trial in place. All trials are removed and the cut bone surfaces prepared with pulsatile lavage. Cement is mixed and once ready for implantation, the size 7 tibial implant, size  7 posterior stabilized femoral component, and the size 41 patella are cemented in place and the patella is held with the clamp. The trial insert is placed and the knee held in full extension.  All extruded cement is removed and once the cement is hard the permanent 8 mm posterior stabilized rotating platform insert is placed into the tibial tray.      The wound is copiously irrigated with saline solution and the extensor mechanism closed over a hemovac drain with #1 V-loc suture. The tourniquet is released for a total tourniquet time of 33  minutes. Flexion against gravity is 140 degrees and the patella tracks normally. Subcutaneous tissue is closed with 2.0 vicryl and subcuticular with running 4.0 Monocryl.       The  Left lower extremity is wrapped in Esmarch, knee flexed and the tourniquet inflated to 300 mmHg.       A midline incision is made with a ten blade through the subcutaneous tissue to the level of the extensor mechanism. A fresh blade is used to make a medial parapatellar arthrotomy. Soft tissue over the proximal medial tibia is subperiosteally elevated to the joint line with a knife and into the semimembranosus bursa with a Cobb elevator. Soft tissue over the proximal lateral tibia is elevated with attention being paid to avoiding the patellar tendon on the tibial tubercle. The patella is everted, knee flexed 90 degrees and the ACL and  PCL are removed. Findings are bone on bone medial and patellofemoral with large global osteophytes.        The drill is used to create a starting hole in the distal femur and the canal is thoroughly irrigated with sterile saline to remove the fatty contents. The 5 degree Left  valgus alignment guide is placed into the femoral canal and the distal femoral cutting block is pinned to remove 9 mm off the distal femur. Resection is made with an oscillating saw.      The tibia is subluxed forward and the menisci are removed. The extramedullary alignment guide is placed referencing proximally at the medial aspect of the tibial tubercle and distally along the second metatarsal axis and tibial crest. The block is pinned to remove 38mm off the more deficient medial  side. Resection is made with an oscillating saw. Size 7is the most appropriate size for the tibia and the proximal tibia is prepared with the modular drill and keel punch for that size.      The femoral  sizing guide is placed and size 7 is most appropriate. Rotation is marked off the epicondylar axis and confirmed by creating a rectangular flexion gap at 90 degrees. The size 7 cutting block is pinned in this rotation and the anterior, posterior and chamfer cuts are made with the oscillating saw. The intercondylar block is then placed and that cut is made.      Trial size 7 tibial component, trial size 7 posterior stabilized femur and a 8  mm posterior stabilized rotating platform insert trial is placed. Full extension is achieved with excellent varus/valgus and anterior/posterior balance throughout full range of motion. The patella is everted and thickness measured to be 25  mm. Free hand resection is taken to 14 mm, a 41 template is placed, lug holes are drilled, trial patella is placed, and it tracks normally. Osteophytes are removed off the posterior femur with the trial in place. All trials are removed and the cut bone surfaces prepared with pulsatile  lavage. Cement is mixed and once ready for implantation, the size 7 tibial implant, size  7 posterior stabilized femoral component, and the size 41 patella are cemented in place and the patella is held with the clamp. The trial insert is placed and the knee held in full extension.   All extruded cement is removed and once the cement is hard the permanent 8 mm posterior stabilized rotating platform insert is placed into the tibial tray.      The wound is copiously irrigated with saline solution and the extensor mechanism closed over a hemovac drain with #1 V-loc suture. The tourniquet is released for a total tourniquet time of 33  minutes. Flexion against gravity is 135 degrees and the patella tracks normally. Subcutaneous tissue is closed with 2.0 vicryl and subcuticular with running 4.0 Monocryl. The incisions are cleaned and dried and steri-strips and  bulky sterile dressings are applied. The limbs are placed into knee immobilizers and the patient is awakened and transported to recovery in stable condition.            Please note that a surgical assistant was a medical necessity for this procedure in order to perform it in a safe and expeditious manner. Surgical assistant was necessary to retract the ligaments and vital neurovascular structures to prevent injury to them and also necessary for proper positioning of the limb to allow for anatomic placement of the prosthesis.   Dione Plover James Puzzo, MD    02/14/2014, 1:26 PM

## 2014-02-14 NOTE — Anesthesia Preprocedure Evaluation (Addendum)
Anesthesia Evaluation  Patient identified by MRN, date of birth, ID band Patient awake    Reviewed: Allergy & Precautions, H&P , NPO status , Patient's Chart, lab work & pertinent test results  History of Anesthesia Complications (+) history of anesthetic complications  Airway Mallampati: II TM Distance: >3 FB Neck ROM: Full    Dental no notable dental hx.    Pulmonary pneumonia -, resolved, former smoker,  breath sounds clear to auscultation  Pulmonary exam normal       Cardiovascular Exercise Tolerance: Good hypertension, Pt. on medications Rhythm:Regular Rate:Normal     Neuro/Psych negative neurological ROS  negative psych ROS   GI/Hepatic Neg liver ROS, GERD-  Medicated,  Endo/Other  negative endocrine ROS  Renal/GU negative Renal ROS  negative genitourinary   Musculoskeletal negative musculoskeletal ROS (+)   Abdominal   Peds negative pediatric ROS (+)  Hematology negative hematology ROS (+)   Anesthesia Other Findings   Reproductive/Obstetrics negative OB ROS                          Anesthesia Physical Anesthesia Plan  ASA: II  Anesthesia Plan: Epidural and Spinal   Post-op Pain Management:    Induction: Intravenous  Airway Management Planned:   Additional Equipment:   Intra-op Plan:   Post-operative Plan:   Informed Consent: I have reviewed the patients History and Physical, chart, labs and discussed the procedure including the risks, benefits and alternatives for the proposed anesthesia with the patient or authorized representative who has indicated his/her understanding and acceptance.   Dental advisory given  Plan Discussed with: CRNA  Anesthesia Plan Comments: (Discussed spinal/epidural and general anesthesia. He consents to spinal/epidural. Discussed risks/benefits of spinal including headache, backache, failure, bleeding, infection, and nerve damage. Patient  consents to spinal. Questions answered. Coagulation studies and platelet count acceptable.)       Anesthesia Quick Evaluation

## 2014-02-15 ENCOUNTER — Encounter (HOSPITAL_COMMUNITY): Payer: Self-pay | Admitting: Orthopedic Surgery

## 2014-02-15 DIAGNOSIS — M1710 Unilateral primary osteoarthritis, unspecified knee: Secondary | ICD-10-CM

## 2014-02-15 DIAGNOSIS — Z96659 Presence of unspecified artificial knee joint: Secondary | ICD-10-CM

## 2014-02-15 LAB — PROTIME-INR
INR: 1.13 (ref 0.00–1.49)
Prothrombin Time: 14.5 seconds (ref 11.6–15.2)

## 2014-02-15 LAB — BASIC METABOLIC PANEL
Anion gap: 9 (ref 5–15)
BUN: 17 mg/dL (ref 6–23)
CO2: 24 mEq/L (ref 19–32)
CREATININE: 0.88 mg/dL (ref 0.50–1.35)
Calcium: 8.8 mg/dL (ref 8.4–10.5)
Chloride: 107 mEq/L (ref 96–112)
GFR, EST NON AFRICAN AMERICAN: 88 mL/min — AB (ref >90)
Glucose, Bld: 152 mg/dL — ABNORMAL HIGH (ref 70–99)
Potassium: 4.7 mEq/L (ref 3.7–5.3)
Sodium: 140 mEq/L (ref 137–147)

## 2014-02-15 LAB — CBC
HEMATOCRIT: 28.7 % — AB (ref 39.0–52.0)
Hemoglobin: 9.9 g/dL — ABNORMAL LOW (ref 13.0–17.0)
MCH: 30.9 pg (ref 26.0–34.0)
MCHC: 34.5 g/dL (ref 30.0–36.0)
MCV: 89.7 fL (ref 78.0–100.0)
Platelets: 139 10*3/uL — ABNORMAL LOW (ref 150–400)
RBC: 3.2 MIL/uL — ABNORMAL LOW (ref 4.22–5.81)
RDW: 12.2 % (ref 11.5–15.5)
WBC: 7.3 10*3/uL (ref 4.0–10.5)

## 2014-02-15 MED ORDER — POLYSACCHARIDE IRON COMPLEX 150 MG PO CAPS
150.0000 mg | ORAL_CAPSULE | Freq: Every day | ORAL | Status: DC
  Administered 2014-02-15: 150 mg via ORAL
  Filled 2014-02-15 (×2): qty 1

## 2014-02-15 MED ORDER — SODIUM CHLORIDE 0.9 % IV BOLUS (SEPSIS)
250.0000 mL | Freq: Once | INTRAVENOUS | Status: AC
  Administered 2014-02-15: 250 mL via INTRAVENOUS

## 2014-02-15 MED ORDER — WARFARIN - PHARMACIST DOSING INPATIENT: Freq: Every day | Status: DC

## 2014-02-15 MED ORDER — SODIUM CHLORIDE 0.9 % IV SOLN
INTRAVENOUS | Status: DC
  Administered 2014-02-15 – 2014-02-16 (×2): via EPIDURAL
  Filled 2014-02-15 (×4): qty 20

## 2014-02-15 MED ORDER — WARFARIN SODIUM 3 MG PO TABS
3.0000 mg | ORAL_TABLET | Freq: Once | ORAL | Status: AC
  Administered 2014-02-15: 3 mg via ORAL
  Filled 2014-02-15: qty 1

## 2014-02-15 MED ORDER — WARFARIN VIDEO
Freq: Once | Status: AC
  Administered 2014-02-15: 18:00:00

## 2014-02-15 MED ORDER — SODIUM CHLORIDE 0.9 % IV BOLUS (SEPSIS)
500.0000 mL | Freq: Once | INTRAVENOUS | Status: AC
  Administered 2014-02-15: 500 mL via INTRAVENOUS

## 2014-02-15 MED ORDER — PATIENT'S GUIDE TO USING COUMADIN BOOK
Freq: Once | Status: AC
  Administered 2014-02-15: 18:00:00
  Filled 2014-02-15: qty 1

## 2014-02-15 MED ORDER — ROPIVACAINE HCL 2 MG/ML IJ SOLN
10.0000 mL/h | INTRAMUSCULAR | Status: DC
  Filled 2014-02-15: qty 200

## 2014-02-15 NOTE — Evaluation (Signed)
Physical Therapy Evaluation Patient Details Name: James Atkinson MRN: [id number] DOB: [birth year] Today's Date: 02/15/2014   History of Present Illness  Bil TKR  Clinical Impression  Marked improvement in activity tolerance with pt denying dizziness or nausea throughout session    Follow Up Recommendations CIR    Equipment Recommendations  None recommended by PT    Recommendations for Other Services OT consult     Precautions / Restrictions Precautions Precautions: Knee;Fall Required Braces or Orthoses: Knee Immobilizer - Right;Knee Immobilizer - Left Knee Immobilizer - Right: Discontinue once straight leg raise with < 10 degree lag Knee Immobilizer - Left: Discontinue once straight leg raise with < 10 degree lag Restrictions Weight Bearing Restrictions: No Other Position/Activity Restrictions: WBAT      Mobility  Bed Mobility Overal bed mobility: Needs Assistance;+2 for physical assistance Bed Mobility: Supine to Sit     Supine to sit: Mod assist;+2 for physical assistance Sit to supine: Mod assist;+2 for physical assistance   General bed mobility comments: cues for sequence with physical assist for LE management and to control trunk  Transfers Overall transfer level: Needs assistance Equipment used: Rolling walker (2 wheeled) Transfers: Sit to/from Stand Sit to Stand: Mod assist;+2 physical assistance;From elevated surface         General transfer comment: cues for LE management and use of UEs to self assist  Ambulation/Gait Ambulation/Gait assistance: Mod assist;+2 physical assistance Ambulation Distance (Feet): 8 Feet Assistive device: Rolling walker (2 wheeled) Gait Pattern/deviations: Step-to pattern;Decreased step length - right;Decreased step length - left;Shuffle;Trunk flexed Gait velocity: decr   General Gait Details: cues for posture, sequence, wt shift and position from [employer] Mobility    Modified Rankin  (Stroke Patients Only)       Balance                                             Pertinent Vitals/Pain Pain Assessment: 0-10 Pain Score: 4  Pain Location: R knee Pain Descriptors / Indicators: Aching Pain Intervention(s): Limited activity within patient's tolerance;Monitored during session;Premedicated before session;Ice applied    Home Living Family/patient expects to be discharged to:: Inpatient rehab                      Prior Function Level of Independence: Independent               Hand Dominance        Extremity/Trunk Assessment   Upper Extremity Assessment: Overall WFL for tasks assessed           Lower Extremity Assessment: RLE deficits/detail;LLE deficits/detail RLE Deficits / Details: 2-/5 quads with AAROM at knee -10- 85 LLE Deficits / Details: 2/5 quads with AAROM at knee -15 - 45 with muscle guarding limiting   Cervical / Trunk Assessment: Normal  Communication   Communication: No difficulties  Cognition Arousal/Alertness: Awake/alert Behavior During Therapy: WFL for tasks assessed/performed Overall Cognitive Status: Within Functional Limits for tasks assessed                      General Comments      Exercises Total Joint Exercises Ankle Circles/Pumps: AROM;Both;15 reps;Supine Quad Sets: Both;AAROM;10 reps;Supine Heel Slides: AAROM;Both;10 reps;Supine Hip ABduction/ADduction: AAROM;Both;10 reps;Supine      Assessment/Plan  PT Assessment Patient needs continued PT services  PT Diagnosis Difficulty walking   PT Problem List Decreased strength;Decreased range of motion;Decreased activity tolerance;Decreased mobility;Decreased knowledge of use of DME;Pain  PT Treatment Interventions DME instruction;Gait training;Stair training;Functional mobility training;Therapeutic activities;Therapeutic exercise;Patient/family education   PT Goals (Current goals can be found in the Care Plan section) Acute Rehab  PT Goals Patient Stated Goal: walk without pain PT Goal Formulation: With patient Time For Goal Achievement: 02/22/14 Potential to Achieve Goals: Good    Frequency 7X/week   Barriers to discharge        Co-evaluation               End of Session Equipment Utilized During Treatment: Right knee immobilizer;Left knee immobilizer Activity Tolerance: Patient tolerated treatment well Patient left: in chair;with call bell/phone within reach;with family/visitor present Nurse Communication: Mobility status         Time: 7209-4709 PT Time Calculation (min): 23 min   Charges:   PT Evaluation $Initial PT Evaluation Tier I: 1 Procedure PT Treatments $Gait Training: 8-22 mins $Therapeutic Exercise: 8-22 mins $Therapeutic Activity: 8-22 mins   PT G Codes:          Matheau Orona 02/15/2014, 3:01 PM

## 2014-02-15 NOTE — Evaluation (Signed)
Physical Therapy Evaluation Patient Details Name: James Atkinson MRN: [id number] DOB: September 11, 1945 Today's Date: 02/15/2014   History of Present Illness  Bil TKR  Clinical Impression  Pt s/p Bil TKR presents with decreased BIL LE strength/ROM and post op pain, nausea and dizziness limiting functional mobility.  Pt would benefit from follow up rehab at CIR level to maximize IND prior to return home    Follow Up Recommendations CIR    Equipment Recommendations  None recommended by PT    Recommendations for Other Services OT consult     Precautions / Restrictions Precautions Precautions: Knee;Fall Required Braces or Orthoses: Knee Immobilizer - Right;Knee Immobilizer - Left Knee Immobilizer - Right: Discontinue once straight leg raise with < 10 degree lag Knee Immobilizer - Left: Discontinue once straight leg raise with < 10 degree lag Restrictions Weight Bearing Restrictions: No Other Position/Activity Restrictions: WBAT      Mobility  Bed Mobility Overal bed mobility: Needs Assistance;+2 for physical assistance Bed Mobility: Supine to Sit;Sit to Supine     Supine to sit: Mod assist;+2 for physical assistance Sit to supine: Mod assist;+2 for physical assistance   General bed mobility comments: cues for sequence with physical assist for LE management and to control trunk  Transfers Overall transfer level:  (NT 2* dizziness/nausea in sitting)               General transfer comment: Pt sat at EOB ~ 5 min.  Returned to bed 2* dizziness/nausea.  BP 83/44 on return to supine.  Unable to obtain BP in sitting 2* pt movement  Ambulation/Gait                Stairs            Wheelchair Mobility    Modified Rankin (Stroke Patients Only)       Balance                                             Pertinent Vitals/Pain Pain Assessment: 0-10 Pain Score: 4  Pain Location: L knee  Pain Descriptors / Indicators: Aching Pain  Intervention(s): Limited activity within patient's tolerance;Premedicated before session;Ice applied;Other (comment) (Epidural in place)    Home Living Family/patient expects to be discharged to:: Inpatient rehab                      Prior Function Level of Independence: Independent               Hand Dominance        Extremity/Trunk Assessment   Upper Extremity Assessment: Overall WFL for tasks assessed           Lower Extremity Assessment: RLE deficits/detail;LLE deficits/detail RLE Deficits / Details: 2-/5 quads with AAROM at knee -10- 85 LLE Deficits / Details: 2/5 quads with AAROM at knee -15 - 45 with muscle guarding limiting   Cervical / Trunk Assessment: Normal  Communication   Communication: No difficulties  Cognition Arousal/Alertness: Awake/alert Behavior During Therapy: WFL for tasks assessed/performed Overall Cognitive Status: Within Functional Limits for tasks assessed                      General Comments      Exercises Total Joint Exercises Ankle Circles/Pumps: AROM;Both;15 reps;Supine Quad Sets: Both;AAROM;10 reps;Supine Heel Slides: AAROM;Both;10 reps;Supine Hip ABduction/ADduction: AAROM;Both;10 reps;Supine  Assessment/Plan    PT Assessment Patient needs continued PT services  PT Diagnosis Difficulty walking   PT Problem List Decreased strength;Decreased range of motion;Decreased activity tolerance;Decreased mobility;Decreased knowledge of use of DME;Pain  PT Treatment Interventions DME instruction;Gait training;Stair training;Functional mobility training;Therapeutic activities;Therapeutic exercise;Patient/family education   PT Goals (Current goals can be found in the Care Plan section) Acute Rehab PT Goals Patient Stated Goal: walk without pain PT Goal Formulation: With patient Time For Goal Achievement: 02/22/14 Potential to Achieve Goals: Good    Frequency 7X/week   Barriers to discharge         Co-evaluation               End of Session Equipment Utilized During Treatment: Right knee immobilizer;Left knee immobilizer Activity Tolerance: Other (comment) (nausea, dizziness) Patient left: in bed Nurse Communication: Mobility status;Other (comment) (BP)         Time: 7253-6644 PT Time Calculation (min): 37 min   Charges:   PT Evaluation $Initial PT Evaluation Tier I: 1 Procedure PT Treatments $Therapeutic Exercise: 8-22 mins $Therapeutic Activity: 8-22 mins   PT G Codes:          James Atkinson 02/15/2014, 1:17 PM

## 2014-02-15 NOTE — Progress Notes (Signed)
ANTICOAGULATION CONSULT NOTE - Initial Consult  Pharmacy Consult for Warfarin Indication: VTE prophylaxis  Allergies  Allergen Reactions  . Ciprofloxacin Nausea Only    Patient Measurements: Height: 5\' 5"  (165.1 cm) Weight: 155 lb (70.308 kg) IBW/kg (Calculated) : 61.5   Vital Signs: Temp: 98.2 F (36.8 C) (08/12 2205) Temp src: Oral (08/12 2205) BP: 99/48 mmHg (08/12 2205) Pulse Rate: 61 (08/12 2205)  Labs: No results found for this basename: HGB, HCT, PLT, APTT, LABPROT, INR, HEPARINUNFRC, CREATININE, CKTOTAL, CKMB, TROPONINI,  in the last 72 hours  Estimated Creatinine Clearance: 72.7 ml/min (by C-G formula based on Cr of 0.87).   Medical History: Past Medical History  Diagnosis Date  . Allergy   . Colon polyp   . Hypercholesteremia     diet controlled  . Hypertension   . GERD (gastroesophageal reflux disease)   . History of shingles   . Hemorrhoids     hx of  . Pneumonia 2012    hx of  . H/O measles     as child  . H/O mumps     as child  . Arthritis     thumbs, knees, back  . Complication of anesthesia     "hard to wake up after colonoscopy"  . Ringing in ears     Medications:  Scheduled:  . acetaminophen  1,000 mg Oral 4 times per day  . dexamethasone  10 mg Oral Daily   Or  . dexamethasone  10 mg Intravenous Daily  . docusate sodium  100 mg Oral BID  . HYDROmorphone      . pantoprazole  40 mg Oral Daily  . scopolamine  1 patch Transdermal Once   Infusions:  . sodium chloride 100 mL/hr (02/14/14 1732)  . naLOXone [facility]) adult infusion for PRURITIS    . ropivacaine (PF) 2 mg/ml (0.2%) 15 mL/hr (02/14/14 1415)    Assessment: 67yoM s/p bilateral TKA, currently on ropivacaine epidural.  Warfarin per Rx for VTE prophylaxis.  Goal of Therapy:  INR 2-3    Plan:   Daily PT/INR  Education  F/u when epidural to be pulled  Lawana Pai R 02/15/2014,12:49 AM

## 2014-02-15 NOTE — Progress Notes (Signed)
Subjective: 1 Day Post-Op Procedure(s) (LRB): BILATERAL TOTAL KNEE ARTHROPLASTY (Bilateral) Patient reports pain as mild.   Patient seen in rounds by Dr. [name]. Patient is well, but has had some minor complaints of pain in the knees, requiring pain medications We will start therapy today.  [facility] hospital stay. Pressure were low last night. Monitor for symptoms.  Objective: Vital signs in last 24 hours: Temp:  [97.5 F (36.4 C)-98.6 F (37 C)] 97.6 F (36.4 C) (08/13 0604) Pulse Rate:  [47-71] 50 (08/13 0604) Resp:  [14-21] 19 (08/13 0604) BP: (83-129)/(44-74) 95/45 mmHg (08/13 0604) SpO2:  [95 %-100 %] 100 % (08/13 0604) Weight:  [70.308 kg (155 lb)] 70.308 kg (155 lb) (08/12 1510)  Intake/Output from previous day:  Intake/Output Summary (Last 24 hours) at 02/15/14 0718 Last data filed at 02/15/14 0610  Gross per 24 hour  Intake 4368.34 ml  Output   4450 ml  Net -81.66 ml    Intake/Output this shift: UOP 1250 since MN Fluids are neutral at this time.    Labs:  Recent Labs  02/15/14 0431  HGB 9.9*    Recent Labs  02/15/14 0431  WBC 7.3  RBC 3.20*  HCT 28.7*  PLT 139*    Recent Labs  02/15/14 0431  NA 140  K 4.7  CL 107  CO2 24  BUN 17  CREATININE 0.88  GLUCOSE 152*  CALCIUM 8.8    Recent Labs  02/15/14 0431  INR 1.13    EXAM General - Patient is Alert, Appropriate and Oriented Extremity - Neurovascular intact Sensation intact distally Dorsiflexion/Plantar flexion intact Dressing - dressings C/D/I Motor Function - intact, moving feet and toes well on exam.  Both Hemovacs pulled without difficulty. Epidural still in place  Past Medical History  Diagnosis Date  . Allergy   . Colon polyp   . Hypercholesteremia     diet controlled  . Hypertension   . GERD (gastroesophageal reflux disease)   . History of shingles   . Hemorrhoids     hx of  . Pneumonia 2012    hx of  . H/O measles     as  child  . H/O mumps     as child  . Arthritis     thumbs, knees, back  . Complication of anesthesia     "hard to wake up after colonoscopy"  . Ringing in ears     Assessment/Plan: 1 Day Post-Op Procedure(s) (LRB): BILATERAL TOTAL KNEE ARTHROPLASTY (Bilateral) Principal Problem:   OA (osteoarthritis) of knee  Estimated body mass index is 25.79 kg/(m^2) as calculated from the following:   Height as of this encounter: 5\' 5"  (1.651 m).   Weight as of this encounter: 70.308 kg (155 lb). Advance diet Up with therapy Plan is for CIR if candidate Will give fluid bolus with low pressure and recheck pressure. Lisinopril on hold at this time  Continue foley for now.  Will keep foley until tomorrow and will not be removed until at least 6-8 hours following the removal of the epidural catheter.  DVT Prophylaxis - Lovenox and Coumadin, Lovenox will not start until tomorrow afternoon following removal of the epidural. First dose of Coumadin this evening. Weight-Bearing as tolerated to both leg  Continue O2 and Pulse OX  HGB 9.9 following surgery  Take Coumadin for four weeks and then discontinue.  The dose may need to be adjusted based upon the INR.  Please follow the INR  and titrate Coumadin dose for a therapeutic range between 2.0 and 3.0 INR.  After completing the four weeks of Coumadin, the patient may stop the Coumadin and resume their 81 mg Aspirin daily.  Lovenox injections will start tomorrow evening after the epidural has been removed and continue until the INR is therapeutic at or greater than 2.0.  When INR reaches the therapeutic level of equal to or greater than 2.0, the patient may discontinue the Lovenox injections.  Arlee Muslim, PA-C Orthopaedic Surgery 02/15/2014, 7:18 AM

## 2014-02-15 NOTE — Progress Notes (Signed)
I will see pt tomorrow to begin discussions of the potential need for an inpt rehab admission prior to d/c home. I have begun authorization process with [facility] [facility] [place] today. [phone]

## 2014-02-15 NOTE — Progress Notes (Signed)
ANTICOAGULATION CONSULT NOTE - Follow Up Consult  Pharmacy Consult for Warfarin Indication: VTE prophylaxis  Allergies  Allergen Reactions  . Ciprofloxacin Nausea Only    Patient Measurements: Height: 5\' 5"  (165.1 cm) Weight: 155 lb (70.308 kg) IBW/kg (Calculated) : 61.5  Vital Signs: Temp: 97.6 F (36.4 C) (08/13 0604) Temp src: Oral (08/13 0105) BP: 95/45 mmHg (08/13 0946) Pulse Rate: 65 (08/13 0946)  Labs:  Recent Labs  02/15/14 0431  HGB 9.9*  HCT 28.7*  PLT 139*  LABPROT 14.5  INR 1.13  CREATININE 0.88    Estimated Creatinine Clearance: 71.8 ml/min (by C-G formula based on Cr of 0.88).   Medications:  Scheduled:  . acetaminophen  1,000 mg Oral 4 times per day  . docusate sodium  100 mg Oral BID  . iron polysaccharides  150 mg Oral Daily  . pantoprazole  40 mg Oral Daily  . scopolamine  1 patch Transdermal Once   Infusions:  . sodium chloride 100 mL/hr at 02/15/14 0601  . naLOXone [facility]) adult infusion for PRURITIS    . ropivacaine (PF) 2 mg/ml (0.2%) 6 mL/hr (02/15/14 0930)   PRN: acetaminophen, acetaminophen, bisacodyl, diphenhydrAMINE, diphenhydrAMINE, diphenhydrAMINE, diphenhydrAMINE, menthol-cetylpyridinium, methocarbamol (ROBAXIN) IV, methocarbamol, metoCLOPramide (REGLAN) injection, metoCLOPramide, morphine injection, naLOXone (NARCAN) adult infusion for PRURITIS, naloxone, ondansetron (ZOFRAN) IV, ondansetron, oxyCODONE, phenol, polyethylene glycol, sodium chloride, traMADol  Assessment: 67yoM s/p bilateral TKA, currently on ropivacaine epidural. Warfarin per Rx for VTE prophylaxis to begin tonight. Ortho planning on starting lovenox 12 hours after epidural has been pulled on 8/14.  INR 1.13 today (0.99 pre-op)  ABLA, Hgb down 9.9 today (12.7 pre-op)  Plts baseline low, 139 today (144 pre-op)  No bleeding reported  No drug-drug interactions currently noted, however, would recommend holding PTA fish oil supplement and ibuprofen until  warfarin therapy complete.  Tolerating regular diet   Goal of Therapy:  INR 2-3 Monitor platelets by anticoagulation protocol: Yes   Plan:   Begin warfarin 3mg  PO x 1 today at 18:00  Daily PT/INR  Follow up epidural removal 8/14  Provide warfarin education prior to discharge  Peggyann Juba, PharmD, BCPS Pager: [phone] 02/15/2014,10:57 AM

## 2014-02-15 NOTE — Progress Notes (Signed)
OT Cancellation Note  Patient Details Name: James Atkinson MRN: [id number] DOB: 1946-02-19   Cancelled Treatment:    Reason Eval/Treat Not Completed: Other (comment) Note pt with dizziness and nausea with PT. Will hold off on OT and check back in am.  Jules Schick [phone] 02/15/2014, 1:42 PM

## 2014-02-15 NOTE — Progress Notes (Signed)
Notified anesthesia about pt BP of 87/50 hr 47; pt asymptomatic; ordered to give 250cc NS bolus and to decrease ropivaccine to 10cc/hr. Will continue to monitor.

## 2014-02-15 NOTE — Consult Note (Signed)
Physical Medicine and Rehabilitation Consult  Reason for Consult: Bilateral Knee OA Referring Physician: Dr. [name].    HPI: James Atkinson is a 68 y.o. male with history of HTN, GERD, dysphagia s/p dilatation, DJD/DDD with endstage changes bilateral knees and failure of conservative therapy. He elected to undergo B-TKR on 02/14/14 by Dr. Chanetta Marshall. Post op is WBAT and on coumadin for DVT prophylaxis. Has epidural for pain control but developed hypotension last pm requiring fluid resuscitation as well as decrease in ropivacaine dose. Labs this am reveal acute blood loss anemia and blood pressures remain low. PT/OT evaluations to be done today. CIR recommended by MD.  Pt denies dizziness pain well controlled with epidural catheter Legs feel funny Review of Systems  Constitutional: Negative.   Eyes: Negative.   Respiratory: Negative.   Cardiovascular: Negative.   Gastrointestinal: Positive for constipation.  Genitourinary:       Foley   Musculoskeletal: Positive for joint pain and neck pain.  Skin: Negative.   Neurological: Positive for tingling and focal weakness.  Endo/Heme/Allergies: Negative.   Psychiatric/Behavioral: Negative.      Past Medical History  Diagnosis Date  . Allergy   . Colon polyp   . Hypercholesteremia     diet controlled  . Hypertension   . GERD (gastroesophageal reflux disease)   . History of shingles   . Hemorrhoids     hx of  . Pneumonia 2012    hx of  . H/O measles     as child  . H/O mumps     as child  . Arthritis     thumbs, knees, back  . Complication of anesthesia     "hard to wake up after colonoscopy"  . Ringing in ears     Past Surgical History  Procedure Laterality Date  . Rotator cuff repair Left 7 years ago  . Knee arthroscopy Left 23 years ago  . Colonoscopy      x2  . Esophageal dilation    . Hemorrhoid banding      x8   . Nasal septum surgery  25 years ago  . Tonsillectomy  2nd grade  . Hernia repair  Bilateral 2012    Family History  Problem Relation Age of Onset  . COPD Father   . Liver cancer Mother   . Lung cancer Paternal Grandfather     Social History:  Married. Works full time. He smoked 1 PPD--quit four months ago. His smoking use included Cigarettes. He has a 10 pack-year smoking history. He quit smokeless tobacco use about 25 years ago. His smokeless tobacco use included Chew. He reports that he does not drink alcohol or use illicit drugs.   Allergies  Allergen Reactions  . Ciprofloxacin Nausea Only   Medications Prior to Admission  Medication Sig Dispense Refill  . Cinnamon 500 MG capsule Take 1,500 mg by mouth 2 (two) times daily.       Marland Kitchen ibuprofen (ADVIL,MOTRIN) 200 MG tablet Take 400 mg by mouth 2 (two) times daily as needed (Pain).      Marland Kitchen lisinopril (PRINIVIL,ZESTRIL) 20 MG tablet Take 20 mg by mouth every morning.       . Multiple Vitamin (MULTIVITAMIN WITH MINERALS) TABS tablet Take 1 tablet by mouth daily.      . Omega-3 Fatty Acids (FISH OIL PO) Take by mouth daily.      Marland Kitchen OVER THE COUNTER MEDICATION Take 1 tablet by mouth 2 (two) times daily. Beta Prostate      .  pantoprazole (PROTONIX) 40 MG tablet Take 1 tablet (40 mg total) by mouth daily.  90 tablet  3  . Flax Oil-Fish Oil-Borage Oil (FISH OIL-FLAX OIL-BORAGE OIL PO) Take 2 capsules by mouth 2 (two) times daily.      . naphazoline-pheniramine (EYE ALLERGY RELIEF) 0.025-0.3 % ophthalmic solution Place 1 drop into both eyes 4 (four) times daily as needed for irritation.        Home: Home Living Family/patient expects to be discharged to:: Inpatient rehab Living Arrangements: Spouse/significant other  Functional History:   Functional Status:  Mobility:          ADL:    Cognition: Cognition Orientation Level: Oriented X4    Blood pressure 95/45, pulse 50, temperature 97.6 F (36.4 C), temperature source Oral, resp. rate 16, height 5\' 5"  (1.651 m), weight 70.308 kg (155 lb), SpO2  98.00%. Physical Exam  Nursing note and vitals reviewed. Constitutional: He is oriented to person, place, and time. He appears well-developed and well-nourished.  HENT:  Head: Normocephalic and atraumatic.  Right Ear: External ear normal.  Left Ear: External ear normal.  Eyes: Conjunctivae and EOM are normal. Pupils are equal, round, and reactive to light.  Neck: Normal range of motion. Neck supple.  Cardiovascular: Normal rate, regular rhythm and normal heart sounds.   Respiratory: Effort normal and breath sounds normal.  GI: Soft. He exhibits distension.  Musculoskeletal:  Bilateral Knee orthoses, with ice packs Did not attempt prox LE ROM FROM at ankles  And BUEs  Neurological: He is alert and oriented to person, place, and time.  Skin: Skin is warm and dry.  Psychiatric: He has a normal mood and affect.    Results for orders placed during the hospital encounter of 02/14/14 (from the past 24 hour(s))  TYPE AND SCREEN     Status: None   Collection Time    02/14/14  9:30 AM      Result Value Ref Range   ABO/RH(D) O POS     Antibody Screen NEG     Sample Expiration 02/17/2014    ABO/RH     Status: None   Collection Time    02/14/14  9:30 AM      Result Value Ref Range   ABO/RH(D) O POS    CBC     Status: Abnormal   Collection Time    02/15/14  4:31 AM      Result Value Ref Range   WBC 7.3  4.0 - 10.5 K/uL   RBC 3.20 (*) 4.22 - 5.81 MIL/uL   Hemoglobin 9.9 (*) 13.0 - 17.0 g/dL   HCT 28.7 (*) 39.0 - 52.0 %   MCV 89.7  78.0 - 100.0 fL   MCH 30.9  26.0 - 34.0 pg   MCHC 34.5  30.0 - 36.0 g/dL   RDW 12.2  11.5 - 15.5 %   Platelets 139 (*) 150 - 400 K/uL  BASIC METABOLIC PANEL     Status: Abnormal   Collection Time    02/15/14  4:31 AM      Result Value Ref Range   Sodium 140  137 - 147 mEq/L   Potassium 4.7  3.7 - 5.3 mEq/L   Chloride 107  96 - 112 mEq/L   CO2 24  19 - 32 mEq/L   Glucose, Bld 152 (*) 70 - 99 mg/dL   BUN 17  6 - 23 mg/dL   Creatinine, Ser 0.88  0.50  - 1.35 mg/dL   Calcium 8.8  8.4 - 10.5 mg/dL   GFR calc non Af Amer 88 (*) >90 mL/min   GFR calc Af Amer >90  >90 mL/min   Anion gap 9  5 - 15  PROTIME-INR     Status: None   Collection Time    02/15/14  4:31 AM      Result Value Ref Range   Prothrombin Time 14.5  11.6 - 15.2 seconds   INR 1.13  0.00 - 1.49   No results found.  Assessment/Plan: Diagnosis: Bilateral end stage OA of Knees POD #1 Bilateral TKR 1. Does the need for close, 24 hr/day medical supervision in concert with the patient's rehab needs make it unreasonable for this patient to be served in a less intensive setting? Potentially 2. Co-Morbidities requiring supervision/potential complications: hand OA, Chronic neck and shoulder pain 3. Due to bladder management, bowel management, safety, skin/wound care, disease management, medication administration, pain management and patient education, does the patient require 24 hr/day rehab nursing? Potentially 4. Does the patient require coordinated care of a physician, rehab nurse, PT (1-2 hrs/day, 5 days/week) and OT (1-2 hrs/day, 5 days/week) to address physical and functional deficits in the context of the above medical diagnosis(es)? Potentially Addressing deficits in the following areas: balance, endurance, locomotion, strength, transferring, bowel/bladder control, bathing, dressing and toileting 5. Can the patient actively participate in an intensive therapy program of at least 3 hrs of therapy per day at least 5 days per week? Potentially 6. The potential for patient to make measurable gains while on inpatient rehab is excellent 7. Anticipated functional outcomes upon discharge from inpatient rehab are modified independent  with PT, modified independent with OT, n/a with SLP. 8. Estimated rehab length of stay to reach the above functional goals is: 7d 9. Does the patient have adequate social supports to accommodate these discharge functional goals? Yes 10. Anticipated D/C  setting: Home 11. Anticipated post D/C treatments: [place] therapy 12. Overall Rehab/Functional Prognosis: excellent  RECOMMENDATIONS: This patient's condition is appropriate for continued rehabilitative care in the following setting: CIR once off epidural and tolerating PT/OT Patient has agreed to participate in recommended program. Yes Note that insurance prior authorization may be required for reimbursement for recommended care.  Comment:     02/15/2014

## 2014-02-15 NOTE — Progress Notes (Signed)
Physical Therapy Treatment Patient Details Name: James Atkinson MRN: [id number] DOB: September 20, 1945 Today's Date: 02/15/2014    History of Present Illness Bil TKR    PT Comments      Follow Up Recommendations  CIR     Equipment Recommendations  None recommended by PT    Recommendations for Other Services OT consult     Precautions / Restrictions Precautions Precautions: Knee;Fall Required Braces or Orthoses: Knee Immobilizer - Right;Knee Immobilizer - Left Knee Immobilizer - Right: Discontinue once straight leg raise with < 10 degree lag Knee Immobilizer - Left: Discontinue once straight leg raise with < 10 degree lag Restrictions Weight Bearing Restrictions: No Other Position/Activity Restrictions: WBAT    Mobility  Bed Mobility Overal bed mobility: Needs Assistance;+2 for physical assistance Bed Mobility: Sit to Supine     Supine to sit: Mod assist;+2 for physical assistance Sit to supine: Mod assist;+2 for physical assistance   General bed mobility comments: cues for sequence with physical assist for LE management and to control trunk  Transfers Overall transfer level: Needs assistance Equipment used: Rolling walker (2 wheeled) Transfers: Sit to/from Stand Sit to Stand: Mod assist;+2 physical assistance;From elevated surface         General transfer comment: cues for LE management and use of UEs to self assist  Ambulation/Gait Ambulation/Gait assistance: Mod assist;+2 physical assistance Ambulation Distance (Feet): 6 Feet Assistive device: Rolling walker (2 wheeled) Gait Pattern/deviations: Step-to pattern;Decreased step length - right;Decreased step length - left;Shuffle;Trunk flexed Gait velocity: decr   General Gait Details: cues for posture, sequence, wt shift and position from [employer] Mobility    Modified Rankin (Stroke Patients Only)       Balance                                     Cognition Arousal/Alertness: Awake/alert Behavior During Therapy: WFL for tasks assessed/performed Overall Cognitive Status: Within Functional Limits for tasks assessed                      Exercises      General Comments        Pertinent Vitals/Pain Pain Assessment: 0-10 Pain Score: 4  Pain Location: L knee Pain Descriptors / Indicators: Aching;Sore Pain Intervention(s): Limited activity within patient's tolerance;Monitored during session;Premedicated before session;Ice applied    Home Living                      Prior Function            PT Goals (current goals can now be found in the care plan section) Acute Rehab PT Goals Patient Stated Goal: walk without pain PT Goal Formulation: With patient Time For Goal Achievement: 02/22/14 Potential to Achieve Goals: Good Progress towards PT goals: Progressing toward goals    Frequency  7X/week    PT Plan Current plan remains appropriate    Co-evaluation             End of Session Equipment Utilized During Treatment: Right knee immobilizer;Left knee immobilizer Activity Tolerance: Patient tolerated treatment well Patient left: in bed;with call bell/phone within reach     Time: 1545-1602 PT Time Calculation (min): 17 min  Charges:  $Gait Training: 8-22 mins $Therapeutic Activity: 8-22 mins  G Codes:      James Atkinson 2014/02/28, 5:08 PM

## 2014-02-15 NOTE — Progress Notes (Signed)
CARE MANAGEMENT NOTE 02/15/2014  Patient:  James Atkinson, James Atkinson   Account Number:  [id number]  Date Initiated:  02/15/2014  Documentation initiated by:  Eller Sweis  Subjective/Objective Assessment:   bilateral knee replacements     Action/Plan:   plans to go to inpatient rehab.   Anticipated DC Date:  02/18/2014   Anticipated DC Plan:  IP REHAB FACILITY  In-house referral  NA      DC Planning Services  CM consult      PAC Choice  NA   Choice offered to / List presented to:  NA   DME arranged  NA      DME agency  NA     [place] arranged  NA      [place] agency  NA   Status of service:  In process, will continue to follow Medicare Important Message given?  NA - LOS <3 / Initial given by admissions (If response is "NO", the following Medicare IM given date fields will be blank) Date Medicare IM given:   Medicare IM given by:   Date Additional Medicare IM given:   Additional Medicare IM given by:    Discharge Disposition:    Per UR Regulation:  Reviewed for med. necessity/level of care/duration of stay  If discussed at [place] of Stay Meetings, dates discussed:    Comments:  Suanne Marker Sinan Tuch,RN,BSN,CCM

## 2014-02-15 NOTE — Progress Notes (Signed)
POD#1 s/p Bil TKR  Epidural for post op pain management.  On 0.2% ropiv. Had hypotension to 80s/40s overnight. Rate reduced from 155cc/hr to 10cc/hr. Pressures remain low. No dizziness or other complaints. Doing well. Mild discomfort on left.  Site c/d/i.  A/P:  Continue epidural. Reduce infusion to 6cc/hr for hypotension. Pt warned of increased discomfort as a result. Plan removal in AM tomorrow.  [employer]

## 2014-02-16 DIAGNOSIS — D62 Acute posthemorrhagic anemia: Secondary | ICD-10-CM | POA: Diagnosis not present

## 2014-02-16 LAB — CBC
HEMATOCRIT: 24.1 % — AB (ref 39.0–52.0)
Hemoglobin: 8.4 g/dL — ABNORMAL LOW (ref 13.0–17.0)
MCH: 31.6 pg (ref 26.0–34.0)
MCHC: 34.9 g/dL (ref 30.0–36.0)
MCV: 90.6 fL (ref 78.0–100.0)
Platelets: 113 10*3/uL — ABNORMAL LOW (ref 150–400)
RBC: 2.66 MIL/uL — ABNORMAL LOW (ref 4.22–5.81)
RDW: 12.6 % (ref 11.5–15.5)
WBC: 8.5 10*3/uL (ref 4.0–10.5)

## 2014-02-16 LAB — BASIC METABOLIC PANEL
Anion gap: 9 (ref 5–15)
BUN: 19 mg/dL (ref 6–23)
CHLORIDE: 104 meq/L (ref 96–112)
CO2: 26 mEq/L (ref 19–32)
CREATININE: 0.9 mg/dL (ref 0.50–1.35)
Calcium: 8.4 mg/dL (ref 8.4–10.5)
GFR calc non Af Amer: 87 mL/min — ABNORMAL LOW (ref >90)
Glucose, Bld: 128 mg/dL — ABNORMAL HIGH (ref 70–99)
Potassium: 4.3 mEq/L (ref 3.7–5.3)
Sodium: 139 mEq/L (ref 137–147)

## 2014-02-16 LAB — PROTIME-INR
INR: 1.14 (ref 0.00–1.49)
Prothrombin Time: 14.6 seconds (ref 11.6–15.2)

## 2014-02-16 MED ORDER — POLYSACCHARIDE IRON COMPLEX 150 MG PO CAPS
150.0000 mg | ORAL_CAPSULE | Freq: Two times a day (BID) | ORAL | Status: DC
  Administered 2014-02-16 – 2014-02-19 (×6): 150 mg via ORAL
  Filled 2014-02-16 (×8): qty 1

## 2014-02-16 MED ORDER — LISINOPRIL 20 MG PO TABS
20.0000 mg | ORAL_TABLET | Freq: Every morning | ORAL | Status: DC
  Administered 2014-02-17 – 2014-02-18 (×2): 20 mg via ORAL
  Filled 2014-02-16 (×4): qty 1

## 2014-02-16 MED ORDER — ENOXAPARIN SODIUM 30 MG/0.3ML ~~LOC~~ SOLN
30.0000 mg | Freq: Two times a day (BID) | SUBCUTANEOUS | Status: DC
  Administered 2014-02-17 – 2014-02-19 (×4): 30 mg via SUBCUTANEOUS
  Filled 2014-02-16 (×7): qty 0.3

## 2014-02-16 MED ORDER — WARFARIN SODIUM 7.5 MG PO TABS
7.5000 mg | ORAL_TABLET | Freq: Once | ORAL | Status: AC
  Administered 2014-02-16: 7.5 mg via ORAL
  Filled 2014-02-16: qty 1

## 2014-02-16 NOTE — Evaluation (Signed)
Occupational Therapy Evaluation Patient Details Name: James Atkinson MRN: [id number] DOB: 1945-09-11 Today's Date: 02/16/2014    History of Present Illness Bil TKR   Clinical Impression   Pt is very motivated. Up to take a few steps with walker but became nauseous and had a drop in BP. Chair pulled up for pt to sit up and BP came back up. Educated on AE options for LB self care and demonstrated all. Wife present. Pt will benefit from skilled OT to increase ADL independence for next venue of care. BP taken in sitting after taking a few steps with walker was 88/42. After several minutes of rest BP 135/62.    Follow Up Recommendations  CIR    Equipment Recommendations  3 in 1 bedside comode    Recommendations for Other Services       Precautions / Restrictions Precautions Precautions: Knee;Fall Required Braces or Orthoses: Knee Immobilizer - Right;Knee Immobilizer - Left Knee Immobilizer - Right: Discontinue once straight leg raise with < 10 degree lag Knee Immobilizer - Left: Discontinue once straight leg raise with < 10 degree lag Restrictions Weight Bearing Restrictions: No Other Position/Activity Restrictions: WBAT      Mobility Bed Mobility Overal bed mobility: Needs Assistance;+2 for physical assistance Bed Mobility: Supine to Sit     Supine to sit: Mod assist;+2 for physical assistance     General bed mobility comments: cues for sequence with physical assist for LE management and to control trunk  Transfers Overall transfer level: Needs assistance Equipment used: Rolling walker (2 wheeled) Transfers: Sit to/from Stand Sit to Stand: Mod assist;+2 physical assistance;From elevated surface         General transfer comment: cues for LE management and use of UEs to self assist    Balance                                            ADL Overall ADL's : Needs assistance/impaired Eating/Feeding: Independent;Sitting   Grooming: Wash/dry  face;Set up;Sitting   Upper Body Bathing: Set up;Supervision/ safety;Sitting   Lower Body Bathing: +2 for physical assistance;Maximal assistance;Sit to/from stand   Upper Body Dressing : Minimal assistance;Sitting Upper Body Dressing Details (indicate cue type and reason): with gown Lower Body Dressing: +2 for physical assistance;Total assistance;Sit to/from stand Lower Body Dressing Details (indicate cue type and reason): not able to reach to bilateral foot; unable to let go of walker in standing to manage clothing. Requiring walker for UE support to stand. Toilet Transfer: +2 for physical assistance;Moderate assistance;RW Toilet Transfer Details (indicate cue type and reason): took a few steps away from EOB and then became nauseous and had to sit. Toileting- Clothing Manipulation and Hygiene: +2 for physical assistance;Total assistance;Sit to/from stand         General ADL Comments: Educated pt and wife on AE options at the end of the session once pt feeling better. He had become nauseous after taking a few steps with walker and needed to sit. Pt is very motivated to participate but limited by the nausea and also a drop in BP with activity.      Vision                     Perception     Praxis      Pertinent Vitals/Pain Pain Assessment: 0-10 Pain Score: 7  Pain Location: bilateral knees  Pain Descriptors / Indicators: Aching Pain Intervention(s): Monitored during session;Ice applied     Hand Dominance     Extremity/Trunk Assessment Upper Extremity Assessment Upper Extremity Assessment: Overall WFL for tasks assessed           Communication Communication Communication: No difficulties   Cognition Arousal/Alertness: Awake/alert Behavior During Therapy: WFL for tasks assessed/performed Overall Cognitive Status: Within Functional Limits for tasks assessed                     General Comments       Exercises       Shoulder Instructions       Home Living Family/patient expects to be discharged to:: Inpatient rehab Living Arrangements: Spouse/significant other                                      Prior Functioning/Environment Level of Independence: Independent             OT Diagnosis: Generalized weakness   OT Problem List: Decreased strength;Decreased knowledge of use of DME or AE   OT Treatment/Interventions: Self-care/ADL training;Patient/family education;Therapeutic activities;DME and/or AE instruction    OT Goals(Current goals can be found in the care plan section) Acute Rehab OT Goals Patient Stated Goal: walk without pain OT Goal Formulation: With patient Time For Goal Achievement: 02/23/14 Potential to Achieve Goals: Good  OT Frequency: Min 2X/week   Barriers to D/C:            Co-evaluation   Reason for Co-Treatment: For patient/therapist safety PT goals addressed during session: Mobility/safety with mobility OT goals addressed during session: ADL's and self-care;Proper use of [employer] equipment and DME      End of Session Equipment Utilized During Treatment: Gait belt;Rolling walker;Right knee immobilizer;Left knee immobilizer  Activity Tolerance: Patient limited by pain;Other (comment) (nausea and drop in BP) Patient left: in chair;with call bell/phone within reach;with family/visitor present   Time: 6761-9509 OT Time Calculation (min): 33 min Charges:  OT General Charges $OT Visit: 1 Procedure OT Evaluation $Initial OT Evaluation Tier I: 1 Procedure OT Treatments $Self Care/Home Management : 8-22 mins G-Codes:    Jules Schick [phone] 02/16/2014, 12:50 PM

## 2014-02-16 NOTE — Progress Notes (Signed)
Subjective: 2 Days Post-Op Procedure(s) (LRB): BILATERAL TOTAL KNEE ARTHROPLASTY (Bilateral) Patient reports pain as mild and moderate.   Patient seen in rounds with Dr. [name].  HGB is down to 8.4.  Monitor for symptoms.  Placed on iron. Patient is well, but has had some minor complaints of pain in the knees, right greater than left, requiring pain medications Epidural to come out today.  Anticipate possible increase in pain temporarily after the epidural has been removed. Plan is to go CIR after hospital stay.  Objective: Vital signs in last 24 hours: Temp:  [97.8 F (36.6 C)-98 F (36.7 C)] 97.8 F (36.6 C) (08/14 0637) Pulse Rate:  [56-70] 62 (08/14 0637) Resp:  [13-24] 17 (08/14 0733) BP: (95-130)/(45-78) 122/66 mmHg (08/14 0637) SpO2:  [98 %-100 %] 100 % (08/14 0733)  Intake/Output from previous day:  Intake/Output Summary (Last 24 hours) at 02/16/14 0752 Last data filed at 02/16/14 1287  Gross per 24 hour  Intake 3811.66 ml  Output   4375 ml  Net -563.34 ml    Intake/Output this shift: UOP 900 since MN Negative on fluids.  Labs:  Recent Labs  02/15/14 0431 02/16/14 0444  HGB 9.9* 8.4*    Recent Labs  02/15/14 0431 02/16/14 0444  WBC 7.3 8.5  RBC 3.20* 2.66*  HCT 28.7* 24.1*  PLT 139* 113*    Recent Labs  02/15/14 0431 02/16/14 0444  NA 140 139  K 4.7 4.3  CL 107 104  BUN 17 19  CREATININE 0.88 0.90  GLUCOSE 152* 128*  CALCIUM 8.8 8.4    Recent Labs  02/15/14 0431 02/16/14 0444  INR 1.13 1.14    EXAM General - Patient is Alert, Appropriate and Oriented Extremity - Neurovascular intact Sensation intact distally Dorsiflexion/Plantar flexion intact Dressing/Incision - clean, dry, no drainage, healing to both knee incisions Motor Function - intact, moving feet and toes well on exam.    Past Medical History  Diagnosis Date  . Allergy   . Colon polyp   . Hypercholesteremia     diet controlled  . Hypertension   . GERD  (gastroesophageal reflux disease)   . History of shingles   . Hemorrhoids     hx of  . Pneumonia 2012    hx of  . H/O measles     as child  . H/O mumps     as child  . Arthritis     thumbs, knees, back  . Complication of anesthesia     "hard to wake up after colonoscopy"  . Ringing in ears     Assessment/Plan: 2 Days Post-Op Procedure(s) (LRB): BILATERAL TOTAL KNEE ARTHROPLASTY (Bilateral) Principal Problem:   OA (osteoarthritis) of knee Active Problems:   Postoperative anemia due to acute blood loss  Estimated body mass index is 25.79 kg/(m^2) as calculated from the following:   Height as of this encounter: 5\' 5"  (1.651 m).   Weight as of this encounter: 70.308 kg (155 lb). Up with therapy  Continue foley due to urinary output monitoring and she still has her epidural in place.  Will remove the catheter six hours after the epidural is removed.  Will need to note in chart when the epidural is pulled.  DVT Prophylaxis - Lovenox and Coumadin, Lovenox will not start until later this afternoon though after the epidural has been removed for twelve hours.  Will need to note in chart when the epidural is pulled. Anticipate possible increase in pain temporarily after the epidural has  been removed.  Weight-Bearing as tolerated to both legs  Take Coumadin for four weeks and then discontinue.  The dose may need to be adjusted based upon the INR.  Please follow the INR and titrate Coumadin dose for a therapeutic range between 2.0 and 3.0 INR.  After completing the four weeks of Coumadin, the patient may stop the Coumadin and resume their 81 mg Aspirin daily.  Lovenox injections will start later this evening after the epidural has been removed and continue until the INR is therapeutic at or greater than 2.0.  When INR reaches the therapeutic level of equal to or greater than 2.0, the patient may discontinue the Lovenox injections.  Arlee Muslim, PA-C Orthopaedic Surgery 02/16/2014, 7:52  AM

## 2014-02-16 NOTE — Progress Notes (Signed)
Physical Therapy Treatment Patient Details Name: James Atkinson MRN: [id number] DOB: [birth year] Today's Date: 02/16/2014    History of Present Illness Bil TKR    PT Comments    Pt continues very motivated and cooperative but ltd by nausea with OOB activity.  Follow Up Recommendations  CIR     Equipment Recommendations  None recommended by PT    Recommendations for Other Services OT consult     Precautions / Restrictions Precautions Precautions: Knee;Fall Required Braces or Orthoses: Knee Immobilizer - Right;Knee Immobilizer - Left Knee Immobilizer - Right: Discontinue once straight leg raise with < 10 degree lag Knee Immobilizer - Left: Discontinue once straight leg raise with < 10 degree lag Restrictions Weight Bearing Restrictions: No Other Position/Activity Restrictions: WBAT    Mobility  Bed Mobility Overal bed mobility: Needs Assistance;+2 for physical assistance Bed Mobility: Supine to Sit     Supine to sit: Mod assist;+2 for physical assistance     General bed mobility comments: cues for sequence with physical assist for LE management and to control trunk  Transfers Overall transfer level: Needs assistance Equipment used: Rolling walker (2 wheeled) Transfers: Sit to/from Stand Sit to Stand: Mod assist;+2 physical assistance;From elevated surface         General transfer comment: cues for LE management and use of UEs to self assist  Ambulation/Gait Ambulation/Gait assistance: Mod assist;+2 physical assistance Ambulation Distance (Feet): 4 Feet Assistive device: Rolling walker (2 wheeled) Gait Pattern/deviations: Step-to pattern;Decreased step length - right;Decreased step length - left;Shuffle Gait velocity: decr   General Gait Details: cues for posture, sequence, wt shift and position from RW; ltd by onset nausea   Stairs            Wheelchair Mobility    Modified Rankin (Stroke Patients Only)       Balance                                     Cognition Arousal/Alertness: Awake/alert Behavior During Therapy: WFL for tasks assessed/performed Overall Cognitive Status: Within Functional Limits for tasks assessed                      Exercises Total Joint Exercises Ankle Circles/Pumps: AROM;Both;15 reps;Supine Quad Sets: Both;AAROM;Supine;15 reps Heel Slides: AAROM;Both;Supine;20 reps Hip ABduction/ADduction: AAROM;Both;10 reps;Supine Straight Leg Raises: AAROM;Both;20 reps;Supine Goniometric ROM: R knee AAROM -10 - 60; L knee AAROM -10 - 45    General Comments        Pertinent Vitals/Pain Pain Assessment: 0-10 Pain Score: 7  Pain Location: Bil knees Pain Descriptors / Indicators: Aching;Dull;Sore Pain Intervention(s): Limited activity within patient's tolerance;Monitored during session;Premedicated before session;Ice applied    Home Living                      Prior Function            PT Goals (current goals can now be found in the care plan section) Acute Rehab PT Goals Patient Stated Goal: walk without pain PT Goal Formulation: With patient Time For Goal Achievement: 02/22/14 Potential to Achieve Goals: Good Progress towards PT goals: Progressing toward goals    Frequency  7X/week    PT Plan Current plan remains appropriate    Co-evaluation PT/OT/SLP Co-Evaluation/Treatment: Yes Reason for Co-Treatment: For patient/therapist safety PT goals addressed during session: Mobility/safety with mobility OT goals addressed during session: ADL's and self-care  End of Session Equipment Utilized During Treatment: Right knee immobilizer;Left knee immobilizer Activity Tolerance: Patient limited by fatigue;Other (comment) (nausea) Patient left: in chair;with call bell/phone within reach;with family/visitor present     Time: 2423-5361 PT Time Calculation (min): 28 min  Charges:  $Gait Training: 8-22 mins $Therapeutic Exercise: 23-37 mins                     G Codes:      Leng Montesdeoca 2014-02-25, 12:12 PM

## 2014-02-16 NOTE — Progress Notes (Signed)
Physical Therapy Treatment Patient Details Name: James Atkinson MRN: [id number] DOB: March 25, 1946 Today's Date: 2014/03/01    History of Present Illness Bil TKR    PT Comments    OOB deferred to later am - hoping for epidural d/c prior to  Follow Up Recommendations  CIR     Equipment Recommendations  None recommended by PT    Recommendations for Other Services OT consult     Precautions / Restrictions Precautions Precautions: Knee;Fall Required Braces or Orthoses: Knee Immobilizer - Right;Knee Immobilizer - Left Knee Immobilizer - Right: Discontinue once straight leg raise with < 10 degree lag Knee Immobilizer - Left: Discontinue once straight leg raise with < 10 degree lag Restrictions Weight Bearing Restrictions: No Other Position/Activity Restrictions: WBAT    Mobility  Bed Mobility                  Transfers                    Ambulation/Gait                 Stairs            Wheelchair Mobility    Modified Rankin (Stroke Patients Only)       Balance                                    Cognition Arousal/Alertness: Awake/alert Behavior During Therapy: WFL for tasks assessed/performed Overall Cognitive Status: Within Functional Limits for tasks assessed                      Exercises Total Joint Exercises Ankle Circles/Pumps: AROM;Both;15 reps;Supine Quad Sets: Both;AAROM;Supine;15 reps Heel Slides: AAROM;Both;Supine;20 reps Hip ABduction/ADduction: AAROM;Both;10 reps;Supine Straight Leg Raises: AAROM;Both;20 reps;Supine Goniometric ROM: R knee AAROM -10 - 60; L knee AAROM -10 - 45    General Comments        Pertinent Vitals/Pain Pain Assessment: 0-10 Pain Score: 7  Pain Location: Bil knees Pain Descriptors / Indicators: Aching;Dull;Sore Pain Intervention(s): Limited activity within patient's tolerance;Monitored during session;Repositioned;Ice applied;Premedicated before session    Home  Living                      Prior Function            PT Goals (current goals can now be found in the care plan section) Acute Rehab PT Goals Patient Stated Goal: walk without pain PT Goal Formulation: With patient Time For Goal Achievement: 02/22/14 Potential to Achieve Goals: Good Progress towards PT goals: Progressing toward goals    Frequency  7X/week    PT Plan Current plan remains appropriate    Co-evaluation             End of Session Equipment Utilized During Treatment: Right knee immobilizer;Left knee immobilizer Activity Tolerance: Patient tolerated treatment well Patient left: in bed;with call bell/phone within reach     Time: 0817-0854 PT Time Calculation (min): 37 min  Charges:  $Therapeutic Exercise: 23-37 mins                    G Codes:      Izzie Geers 03/01/2014, 12:06 PM

## 2014-02-16 NOTE — PMR Pre-admission (Signed)
PMR Admission Coordinator Pre-Admission Assessment  Patient: James Atkinson is an 69 y.o., male MRN: [id number] DOB: 18-Aug-1945 Height: 5\' 5"  (165.1 cm) Weight: 70.308 kg (155 lb)              Insurance Information HMO:     PPO: yes     PCP:      IPA:      80/20:      OTHER:  PRIMARY: [employer] [place]      Policy#: [id number]      Subscriber: wife CM Name: James Atkinson      Phone#: [phone]     Fax#: [phone] Pre-Cert#: [id number] approved for 2 days. Request our initial evals by 8/19 for continued authorization      Employer: [employer] Benefits:  Phone #: [phone]     Name: 8/14 Eff. Date: 12/04/13     Deduct: $150      Out of Pocket Max: $3000      Life Max: none CIR: 80%      SNF: 80% 60 days Outpatient: 80%     Co-Pay: based on medical neccessity Home Health: 80%      Co-Pay: based on medical neccessity DME: 80%     Co-Pay: 20% Providers: in network  SECONDARY: Medicare a and b      Policy#: [id number]      Subscriber: pt  Medicaid Application Date:       Case Manager:  Disability Application Date:       Case Worker:   Emergency Facilities manager Information   Name Relation Home Work Roper Spouse [phone]     Current Medical History  Patient Admitting Diagnosis: Bilateral TKR  History of Present Illness: James Atkinson is a 68 y.o. male with history of HTN, GERD, dysphagia s/p dilatation, DJD/DDD with endstage changes bilateral knees and failure of conservative therapy. He elected to undergo B-TKR on 02/14/14 by Dr. Chanetta Marshall. Post op is WBAT and on coumadin for DVT prophylaxis. Has epidural for pain control but developed hypotension last pm requiring fluid resuscitation as well as decrease in ropivacaine dose. Labs this am reveal acute blood loss anemia and blood pressures remain low.   Past Medical History  Past Medical History  Diagnosis Date  . Allergy   . Colon polyp   . Hypercholesteremia     diet  controlled  . Hypertension   . GERD (gastroesophageal reflux disease)   . History of shingles   . Hemorrhoids     hx of  . Pneumonia 2012    hx of  . H/O measles     as child  . H/O mumps     as child  . Arthritis     thumbs, knees, back  . Complication of anesthesia     "hard to wake up after colonoscopy"  . Ringing in ears     Family History  family history includes COPD in his father; Liver cancer in his mother; Lung cancer in his paternal grandfather.  Prior Rehab/Hospitalizations: none  Current Medications  Current facility-administered medications:0.9 %  sodium chloride infusion, , Intravenous, Continuous, Gaynelle Arabian V, MD, 500 mL at 02/18/14 0827;  acetaminophen (TYLENOL) suppository 650 mg, 650 mg, Rectal, Q6H PRN, Gearlean Alf, MD;  acetaminophen (TYLENOL) tablet 650 mg, 650 mg, Oral, Q6H PRN, Gearlean Alf, MD, 650 mg at 02/19/14 0109;  bisacodyl (DULCOLAX) suppository 10 mg, 10 mg, Rectal, Daily PRN, Gearlean Alf, MD diphenhydrAMINE (BENADRYL)  12.5 MG/5ML elixir 12.5-25 mg, 12.5-25 mg, Oral, Q4H PRN, Gearlean Alf, MD, 25 mg at 02/14/14 2034;  diphenhydrAMINE (BENADRYL) capsule 25 mg, 25 mg, Oral, Q4H PRN, Salley Scarlet, MD;  diphenhydrAMINE (BENADRYL) injection 12.5 mg, 12.5 mg, Intravenous, Q4H PRN, Salley Scarlet, MD, 12.5 mg at 02/15/14 0009;  diphenhydrAMINE (BENADRYL) injection 25 mg, 25 mg, Intramuscular, Q4H PRN, Salley Scarlet, MD docusate sodium (COLACE) capsule 100 mg, 100 mg, Oral, BID, Gearlean Alf, MD, 100 mg at 02/19/14 2778;  enoxaparin (LOVENOX) injection 30 mg, 30 mg, Subcutaneous, Q12H, Arlee Muslim, PA-C, 30 mg at 02/18/14 1943;  iron polysaccharides (NIFEREX) capsule 150 mg, 150 mg, Oral, BID, Arlee Muslim, PA-C, 150 mg at 02/19/14 2423;  lisinopril (PRINIVIL,ZESTRIL) tablet 20 mg, 20 mg, Oral, q morning - 10a, Arlee Muslim, PA-C, 20 mg at 02/18/14 1015 menthol-cetylpyridinium (CEPACOL) lozenge 3 mg, 1 lozenge, Oral, PRN, Gearlean Alf, MD, 3 mg at 02/14/14 2300;  methocarbamol (ROBAXIN) 500 mg in dextrose 5 % 50 mL IVPB, 500 mg, Intravenous, Q6H PRN, Gearlean Alf, MD;  methocarbamol (ROBAXIN) tablet 500 mg, 500 mg, Oral, Q6H PRN, Gearlean Alf, MD, 500 mg at 02/18/14 1942;  metoCLOPramide (REGLAN) injection 5-10 mg, 5-10 mg, Intravenous, Q8H PRN, Gearlean Alf, MD metoCLOPramide (REGLAN) tablet 5-10 mg, 5-10 mg, Oral, Q8H PRN, Gearlean Alf, MD;  morphine 2 MG/ML injection 1-2 mg, 1-2 mg, Intravenous, Q1H PRN, Gearlean Alf, MD, 2 mg at 02/16/14 1733;  naloxone (NARCAN) 2 mg in dextrose 5 % 250 mL infusion, 1-4 mcg/kg/hr, Intravenous, Continuous PRN, Salley Scarlet, MD;  naloxone [facility]) injection 0.4 mg, 0.4 mg, Intravenous, PRN, Salley Scarlet, MD ondansetron (ZOFRAN) injection 4 mg, 4 mg, Intravenous, Q6H PRN, Gearlean Alf, MD;  ondansetron (ZOFRAN) tablet 4 mg, 4 mg, Oral, Q6H PRN, Gaynelle Arabian V, MD;  oxyCODONE (Oxy IR/ROXICODONE) immediate release tablet 5-20 mg, 5-20 mg, Oral, Q3H PRN, Gaynelle Arabian V, MD, 10 mg at 02/19/14 0756;  pantoprazole (PROTONIX) EC tablet 40 mg, 40 mg, Oral, Daily, Gaynelle Arabian V, MD, 40 mg at 02/19/14 0903 phenol (CHLORASEPTIC) mouth spray 1 spray, 1 spray, Mouth/Throat, PRN, Gaynelle Arabian V, MD;  polyethylene glycol (MIRALAX / GLYCOLAX) packet 17 g, 17 g, Oral, Daily PRN, Gaynelle Arabian V, MD, 17 g at 02/18/14 2124;  sodium chloride 0.9 % injection 3 mL, 3 mL, Intravenous, PRN, Salley Scarlet, MD;  traMADol Veatrice Bourbon) tablet 50-100 mg, 50-100 mg, Oral, Q6H PRN, Gaynelle Arabian V, MD, 100 mg at 02/16/14 1721 warfarin (COUMADIN) tablet 7.5 mg, 7.5 mg, Oral, ONCE-1800, Clovis Riley, [facility];  Warfarin - Pharmacist Dosing Inpatient, , Does not apply, q1800, Emiliano Dyer, [facility]  Patients Current Diet: General  Precautions / Restrictions Precautions Precautions: Knee;Fall Restrictions Weight Bearing Restrictions: No Other Position/Activity Restrictions: WBAT   Prior Activity  Level 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, self employed Health and safety inspector / Equipment Home Assistive Devices/Equipment: Eyeglasses  Prior Functional Level Prior Function Level of Independence: Independent  Current Functional Level Cognition  Overall Cognitive Status: Within Functional Limits for tasks assessed Orientation Level: Oriented to person;Oriented to place;Disoriented to time    Extremity Assessment (includes Sensation/Coordination)  Upper Extremity Assessment: Overall WFL for tasks assessed  Lower Extremity Assessment: RLE deficits/detail;LLE deficits/detail  RLE Deficits / Details: 2-/5 quads with AAROM at knee -10- 85  LLE Deficits / Details: 2/5 quads with AAROM at knee -15 - 45 with muscle guarding limiting  Cervical / Trunk  Assessment: Normal    ADLs  Overall ADL's : Needs assistance/impaired Eating/Feeding: Independent;Sitting Grooming: Wash/dry face;Set up;Sitting Upper Body Bathing: Set up;Supervision/ safety;Sitting Lower Body Bathing: +2 for physical assistance;Maximal assistance;Sit to/from stand Upper Body Dressing : Minimal assistance;Sitting Upper Body Dressing Details (indicate cue type and reason): with gown Lower Body Dressing: +2 for physical assistance;Total assistance;Sit to/from stand Lower Body Dressing Details (indicate cue type and reason): not able to reach to bilateral foot; unable to let go of walker in standing to manage clothing. Requiring walker for UE support to stand. Toilet Transfer: +2 for physical assistance;Moderate assistance;RW Toilet Transfer Details (indicate cue type and reason): took a few steps away from EOB and then became nauseous and had to sit. Toileting- Clothing Manipulation and Hygiene: +2 for physical assistance;Total assistance;Sit to/from stand General ADL Comments: Educated pt and wife on AE options at the end of the session once pt feeling better. He had become nauseous after taking a few steps with  walker and needed to sit. Pt is very motivated to participate but limited by the nausea and also a drop in BP with activity.     Mobility  Overal bed mobility: Needs Assistance Bed Mobility: Sit to Supine Supine to sit: Mod assist Sit to supine: Mod assist General bed mobility comments: cues for sequence with physical assist for LE management    Transfers  Overall transfer level: Needs assistance Equipment used: Rolling walker (2 wheeled) Transfers: Sit to/from Stand Sit to Stand: Mod assist;+2 physical assistance General transfer comment: cues for LE management and use of UEs to self assist    Ambulation / Gait / Stairs / Wheelchair Mobility  Ambulation/Gait Ambulation/Gait assistance: Mod assist Ambulation Distance (Feet): 56 Feet (and 6 to [facility]) Assistive device: Rolling walker (2 wheeled) Gait Pattern/deviations: Step-to pattern;Decreased step length - right;Decreased step length - left;Shuffle;Trunk flexed Gait velocity: decr General Gait Details: cues for posture, sequence, wt shift and position from RW    Posture / Balance      Special needs/care consideration BiPAP/CPAP No CPM Yes, alternating bilaterally to knees Continuous Drip IV No Dialysis No         Life Vest No Oxygen No Special Bed No Trach Size No Wound Vac (area) No      Skin Bilateral knee incisions                             Bowel mgmt: Last BM 02/18/14 Bladder mgmt: Using urinal without difficulty Diabetic mgmt No   Previous Home Environment Living Arrangements: Spouse/significant other  Lives With: Spouse Available Help at Discharge: Available 24 hours/day;Other (Comment) (wife can take off one week after d/c) Type of Home: House Home Layout: One level Home Access: Stairs to enter [employer] of Steps: 1 to 2 Bathroom Shower/Tub: Chiropodist: Standard Bathroom Accessibility: Yes How Accessible: Accessible via walker [place]: No  Discharge Living  Setting Plans for Discharge Living Setting: Patient's home;Lives with (comment);Other (Comment) (wife) Type of Home at Discharge: House Discharge Home Layout: One level Discharge Home Access: Stairs to enter Entrance Stairs-Number of Steps: 1 to 2 Discharge Bathroom Shower/Tub: Tub/shower unit Discharge Bathroom Toilet: Standard Discharge Bathroom Accessibility: Yes How Accessible: Accessible via walker Does the patient have any problems obtaining your medications?: No  Social/Family/Support Systems Patient Roles: Spouse;Parent;Other (Comment) (self employed electrician) Contact Information: James Atkinson, wife Anticipated Caregiver: wife  Anticipated Caregiver's Contact Information: see above Ability/Limitations of Caregiver: wife  works fulltime at [employer], can take off one week after d/c Caregiver Availability: 24/7 Discharge Plan Discussed with Primary Caregiver: Yes Is Caregiver In Agreement with Plan?: Yes Does Caregiver/Family have Issues with Lodging/Transportation while Pt is in Rehab?: No  Goals/Additional Needs Patient/Family Goal for Rehab: Mod I with PT and OT Expected length of stay: ELOS 7 days Pt/Family Agrees to Admission and willing to participate: Yes Program Orientation Provided & Reviewed with Pt/Caregiver Including Roles  & Responsibilities: Yes  Decrease burden of Care through IP rehab admission: n/a  Possible need for SNF placement upon discharge: n/a  Patient Condition: This patient's medical and functional status has changed since the consult dated: 02/15/14 in which the Rehabilitation Physician determined and documented that the patient's condition is appropriate for intensive rehabilitative care in an inpatient rehabilitation facility. See "History of Present Illness" (above) for medical update. Functional changes are: Currently requiring mod assist to ambulate 56 ft RW. Patient's medical and functional status update has been discussed with the  Rehabilitation physician and patient remains appropriate for inpatient rehabilitation. Will admit to inpatient rehab today.  Preadmission Screen Completed By:  Retta Diones, 02/19/2014 11:28 AM ______________________________________________________________________   Discussed status with Dr. Naaman Plummer on 02/19/14 at 1133 and received telephone approval for admission today.  Admission Coordinator:  Retta Diones, time1133/Date08/17/15

## 2014-02-16 NOTE — Progress Notes (Signed)
Epidural catheter taken out.  Tip intact.  Site looks good.  Pain meds ordered.  Stpehanie Montroy MD

## 2014-02-16 NOTE — Addendum Note (Signed)
Addendum created 02/16/14 1506 by Peyton Najjar, MD   Modules edited: Clinical Notes   Clinical Notes:  File: [id number]

## 2014-02-16 NOTE — Progress Notes (Signed)
Physical Therapy Treatment Patient Details Name: James Atkinson MRN: [id number] DOB: October 06, 1945 Today's Date: 02/16/2014    History of Present Illness Bil TKR    PT Comments    Pt continues motivated and with no c/o nausea/dizziness this pm but ltd by pain/fatigue.  Follow Up Recommendations  CIR     Equipment Recommendations  None recommended by PT    Recommendations for Other Services OT consult     Precautions / Restrictions Precautions Precautions: Knee;Fall Required Braces or Orthoses: Knee Immobilizer - Right;Knee Immobilizer - Left Knee Immobilizer - Right: Discontinue once straight leg raise with < 10 degree lag Knee Immobilizer - Left: Discontinue once straight leg raise with < 10 degree lag Restrictions Weight Bearing Restrictions: No Other Position/Activity Restrictions: WBAT    Mobility  Bed Mobility Overal bed mobility: Needs Assistance;+2 for physical assistance Bed Mobility: Sit to Supine     Supine to sit: Mod assist;+2 for physical assistance Sit to supine: Mod assist;+2 for physical assistance   General bed mobility comments: cues for sequence with physical assist for LE management and to control trunk  Transfers Overall transfer level: Needs assistance Equipment used: Rolling walker (2 wheeled) Transfers: Sit to/from Stand Sit to Stand: +2 physical assistance;Mod assist;Max assist         General transfer comment: cues for LE management and use of UEs to self assist  Ambulation/Gait Ambulation/Gait assistance: Mod assist;+2 physical assistance Ambulation Distance (Feet): 5 Feet Assistive device: Rolling walker (2 wheeled) Gait Pattern/deviations: Step-to pattern;Decreased step length - right;Decreased step length - left;Shuffle;Trunk flexed Gait velocity: decr   General Gait Details: cues for posture, sequence, wt shift and position from RW; ltd by fatigue/pain   Stairs            Wheelchair Mobility    Modified Rankin  (Stroke Patients Only)       Balance                                    Cognition Arousal/Alertness: Awake/alert Behavior During Therapy: WFL for tasks assessed/performed Overall Cognitive Status: Within Functional Limits for tasks assessed                      Exercises      General Comments General comments (skin integrity, edema, etc.): min guard assist sitting EOB to don gown      Pertinent Vitals/Pain Pain Assessment: 0-10 Pain Score: 7  Pain Location: Bil knees Pain Descriptors / Indicators: Aching;Dull;Sore Pain Intervention(s): Limited activity within patient's tolerance;Monitored during session;Premedicated before session;Ice applied    Home Living Family/patient expects to be discharged to:: Inpatient rehab Living Arrangements: Spouse/significant other Available Help at Discharge: Available 24 hours/day;Other (Comment) (wife can take off one week after d/c) Type of Home: House Home Access: Stairs to enter   Home Layout: One level        Prior Function Level of Independence: Independent          PT Goals (current goals can now be found in the care plan section) Acute Rehab PT Goals Patient Stated Goal: walk without pain PT Goal Formulation: With patient Time For Goal Achievement: 02/22/14 Potential to Achieve Goals: Good Progress towards PT goals: Progressing toward goals    Frequency  7X/week    PT Plan Current plan remains appropriate    Co-evaluation   Reason for Co-Treatment: For patient/therapist safety   OT goals  addressed during session: ADL's and self-care;Proper use of Adaptive equipment and DME     End of Session Equipment Utilized During Treatment: Right knee immobilizer;Left knee immobilizer Activity Tolerance: Patient limited by fatigue;Patient limited by pain Patient left: in bed;with call bell/phone within reach     Time: 1357-1420 PT Time Calculation (min): 23 min  Charges:  $Gait Training: 8-22  mins $Therapeutic Activity: 8-22 mins                    G Codes:      James Atkinson March 10, 2014, 4:07 PM

## 2014-02-16 NOTE — Progress Notes (Signed)
I have contacted CM office to request updated acute hospital clinicals to be provided to [facility] for pt approved for acute hospital through 8/15 only. Please call me with any questions. [phone]

## 2014-02-16 NOTE — Progress Notes (Addendum)
ANTICOAGULATION CONSULT NOTE - Follow Up Consult  Pharmacy Consult for Warfarin Indication: VTE prophylaxis  Allergies  Allergen Reactions  . Ciprofloxacin Nausea Only    Patient Measurements: Height: 5\' 5"  (165.1 cm) Weight: 155 lb (70.308 kg) IBW/kg (Calculated) : 61.5  Vital Signs: Temp: 98 F (36.7 C) (08/14 1006) Temp src: Oral (08/14 1006) BP: 137/62 mmHg (08/14 1006) Pulse Rate: 56 (08/14 1006)  Labs:  Recent Labs  02/15/14 0431 02/16/14 0444  HGB 9.9* 8.4*  HCT 28.7* 24.1*  PLT 139* 113*  LABPROT 14.5 14.6  INR 1.13 1.14  CREATININE 0.88 0.90    Estimated Creatinine Clearance: 70.2 ml/min (by C-G formula based on Cr of 0.9).   Medications:  Scheduled:  . docusate sodium  100 mg Oral BID  . iron polysaccharides  150 mg Oral BID  . lisinopril  20 mg Oral q morning - 10a  . pantoprazole  40 mg Oral Daily  . scopolamine  1 patch Transdermal Once  . Warfarin - Pharmacist Dosing Inpatient   Does not apply q1800   Infusions:  . sodium chloride 100 mL/hr at 02/16/14 0431  . bupivacaine (MARCAINE, SENSORCAINE) epidural 10 mL/hr at 02/16/14 0422  . naLOXone [facility]) adult infusion for PRURITIS     PRN: acetaminophen, acetaminophen, bisacodyl, diphenhydrAMINE, diphenhydrAMINE, diphenhydrAMINE, diphenhydrAMINE, menthol-cetylpyridinium, methocarbamol (ROBAXIN) IV, methocarbamol, metoCLOPramide (REGLAN) injection, metoCLOPramide, morphine injection, naLOXone (NARCAN) adult infusion for PRURITIS, naloxone, ondansetron (ZOFRAN) IV, ondansetron, oxyCODONE, phenol, polyethylene glycol, sodium chloride, traMADol  Assessment: 67yoM s/p bilateral TKA, currently on ropivacaine epidural. Warfarin per Rx for VTE prophylaxis to begin tonight. Ortho planning on starting lovenox 12 hours after epidural has been pulled on 8/14.  INR 1.14 today  ABLA, Hgb down to 8.4 today (12.7 pre-op)  Plts baseline low, 113 today (144 pre-op)  No bleeding reported  No drug-drug  interactions currently noted, however, would recommend holding PTA fish oil supplement and ibuprofen until warfarin therapy complete.  Tolerating regular diet  Epidural removed, tip intact, 8/14 at1505  Warfarin education provided on 8/14   Goal of Therapy:  INR 2-3 Monitor platelets by anticoagulation protocol: Yes   Plan:   Warfarin 7.5 mg PO x 1 today at 18:00  Daily PT/INR  Follow up plans for Lovenox orders.   Gretta Arab PharmD, BCPS Pager [phone] 02/16/2014 3:32 PM

## 2014-02-16 NOTE — Progress Notes (Signed)
I met with pt at bedside to discuss an inpt rehab admission pending insurance approval. Questions answered. I will contact his wife to also discuss. I will begin pre certification once OT eval has been completed. Planning for possible admission on Monday.Pt is aware and in agreement. [phone] 

## 2014-02-17 LAB — CBC
HCT: 24 % — ABNORMAL LOW (ref 39.0–52.0)
Hemoglobin: 8.3 g/dL — ABNORMAL LOW (ref 13.0–17.0)
MCH: 31 pg (ref 26.0–34.0)
MCHC: 34.6 g/dL (ref 30.0–36.0)
MCV: 89.6 fL (ref 78.0–100.0)
Platelets: 120 10*3/uL — ABNORMAL LOW (ref 150–400)
RBC: 2.68 MIL/uL — AB (ref 4.22–5.81)
RDW: 12.5 % (ref 11.5–15.5)
WBC: 7.8 10*3/uL (ref 4.0–10.5)

## 2014-02-17 LAB — PROTIME-INR
INR: 1.17 (ref 0.00–1.49)
PROTHROMBIN TIME: 14.9 s (ref 11.6–15.2)

## 2014-02-17 LAB — BASIC METABOLIC PANEL
Anion gap: 9 (ref 5–15)
BUN: 17 mg/dL (ref 6–23)
CALCIUM: 8.4 mg/dL (ref 8.4–10.5)
CO2: 27 meq/L (ref 19–32)
CREATININE: 0.85 mg/dL (ref 0.50–1.35)
Chloride: 101 mEq/L (ref 96–112)
GFR calc Af Amer: >90 mL/min (ref >90)
GFR, EST NON AFRICAN AMERICAN: 89 mL/min — AB (ref >90)
Glucose, Bld: 117 mg/dL — ABNORMAL HIGH (ref 70–99)
Potassium: 4.1 mEq/L (ref 3.7–5.3)
Sodium: 137 mEq/L (ref 137–147)

## 2014-02-17 MED ORDER — WARFARIN SODIUM 7.5 MG PO TABS
7.5000 mg | ORAL_TABLET | Freq: Once | ORAL | Status: AC
  Administered 2014-02-17: 7.5 mg via ORAL
  Filled 2014-02-17: qty 1

## 2014-02-17 MED ORDER — LIP MEDEX EX OINT
TOPICAL_OINTMENT | CUTANEOUS | Status: AC
  Administered 2014-02-17: 06:00:00
  Filled 2014-02-17: qty 7

## 2014-02-17 NOTE — Progress Notes (Signed)
Physical Therapy Treatment Patient Details Name: James Atkinson MRN: [id number] DOB: 1945-10-30 Today's Date: 02/17/2014    History of Present Illness Bil TKR    PT Comments      Follow Up Recommendations  CIR     Equipment Recommendations  None recommended by PT    Recommendations for Other Services OT consult     Precautions / Restrictions Precautions Precautions: Knee;Fall Required Braces or Orthoses: Knee Immobilizer - Right;Knee Immobilizer - Left Knee Immobilizer - Right: Discontinue once straight leg raise with < 10 degree lag Knee Immobilizer - Left: Discontinue once straight leg raise with < 10 degree lag Restrictions Weight Bearing Restrictions: No Other Position/Activity Restrictions: WBAT    Mobility  Bed Mobility Overal bed mobility: Needs Assistance;+2 for physical assistance         Sit to supine: +2 for physical assistance;Mod assist   General bed mobility comments: cues for sequence with physical assist for LE management and to control trunk  Transfers Overall transfer level: Needs assistance Equipment used: Rolling walker (2 wheeled) Transfers: Sit to/from Stand Sit to Stand: Mod assist;+2 physical assistance         General transfer comment: cues for LE management and use of UEs to self assist  Ambulation/Gait Ambulation/Gait assistance: Mod assist;+2 physical assistance Ambulation Distance (Feet): 17 Feet Assistive device: Rolling walker (2 wheeled) Gait Pattern/deviations: Step-to pattern;Decreased step length - right;Decreased step length - left;Shuffle;Trunk flexed Gait velocity: decr   General Gait Details: cues for posture, sequence, wt shift and position from RW; ltd by fatigue/pain   Stairs            Wheelchair Mobility    Modified Rankin (Stroke Patients Only)       Balance                                    Cognition Arousal/Alertness: Awake/alert Behavior During Therapy: WFL for tasks  assessed/performed Overall Cognitive Status: Within Functional Limits for tasks assessed                      Exercises      General Comments        Pertinent Vitals/Pain Pain Assessment: 0-10 Pain Score: 5  Pain Location: Bil knees Pain Descriptors / Indicators: Aching;Burning Pain Intervention(s): Limited activity within patient's tolerance;Monitored during session;Premedicated before session;Ice applied    Home Living                      Prior Function            PT Goals (current goals can now be found in the care plan section) Acute Rehab PT Goals Patient Stated Goal: walk without pain PT Goal Formulation: With patient Time For Goal Achievement: 02/22/14 Potential to Achieve Goals: Good Progress towards PT goals: Progressing toward goals    Frequency  7X/week    PT Plan Current plan remains appropriate    Co-evaluation             End of Session Equipment Utilized During Treatment: Right knee immobilizer;Left knee immobilizer Activity Tolerance: Patient tolerated treatment well Patient left: in bed;with call bell/phone within reach     Time: 1336-1402 PT Time Calculation (min): 26 min  Charges:  $Gait Training: 8-22 mins $Therapeutic Activity: 8-22 mins  G Codes:      Sumner Kirchman 2014/03/03, 3:17 PM

## 2014-02-17 NOTE — Progress Notes (Signed)
ANTICOAGULATION CONSULT NOTE - Follow Up Consult  Pharmacy Consult for Warfarin Indication: VTE prophylaxis  Allergies  Allergen Reactions  . Ciprofloxacin Nausea Only    Patient Measurements: Height: 5\' 5"  (165.1 cm) Weight: 155 lb (70.308 kg) IBW/kg (Calculated) : 61.5  Vital Signs: Temp: 98.5 F (36.9 C) (08/15 0515) Temp src: Oral (08/15 0515) BP: 126/60 mmHg (08/15 0515) Pulse Rate: 79 (08/15 0515)  Labs:  Recent Labs  02/15/14 0431 02/16/14 0444 02/17/14 0540  HGB 9.9* 8.4* 8.3*  HCT 28.7* 24.1* 24.0*  PLT 139* 113* 120*  LABPROT 14.5 14.6 14.9  INR 1.13 1.14 1.17  CREATININE 0.88 0.90 0.85    Estimated Creatinine Clearance: 74.4 ml/min (by C-G formula based on Cr of 0.85).  Assessment: 67yoM s/p bilateral TKA, currently on ropivacaine epidural. Warfarin per Rx for VTE prophylaxis to begin tonight. Ortho planning on starting lovenox 12 hours after epidural has been pulled on 8/14.  INR 1.17 today  ABLA, Hgb down to 8.3 today (12.7 pre-op)  Plts baseline low, 120 today (144 pre-op)  No bleeding reported  No drug-drug interactions currently noted, however, would recommend holding PTA fish oil supplement and ibuprofen until warfarin therapy complete.  Tolerating regular diet  Epidural removed, tip intact, 8/14 at 1505  Warfarin education provided on 8/14  On enoxaparin 30mg  SQ q12h  Goal of Therapy:  INR 2-3 Monitor platelets by anticoagulation protocol: Yes   Plan:   Warfarin 7.5 mg PO x 1 today at 18:00  Daily PT/INR  Follow up plans for Lovenox orders (no order comments to d/c when INR therapeutic)   Doreene Eland, PharmD, BCPS.   Pager: [phone] 02/17/2014 10:30 AM

## 2014-02-17 NOTE — Progress Notes (Signed)
   Subjective: 3 Days Post-Op Procedure(s) (LRB): BILATERAL TOTAL KNEE ARTHROPLASTY (Bilateral) Patient reports pain as moderate.   Patient seen in rounds with Dr. [name]. Patient is doing fair. He reports that he has not been out of bed much. No issues overnight. No SOB or chest pain.  Plan is to go CIR after hospital stay.  Objective: Vital signs in last 24 hours: Temp:  [98 F (36.7 C)-98.7 F (37.1 C)] 98.5 F (36.9 C) (08/15 0515) Pulse Rate:  [56-79] 79 (08/15 0515) Resp:  [17-20] 18 (08/15 0515) BP: (88-161)/(42-72) 126/60 mmHg (08/15 0515) SpO2:  [98 %-100 %] 98 % (08/15 0515)  Intake/Output from previous day:  Intake/Output Summary (Last 24 hours) at 02/17/14 0907 Last data filed at 02/17/14 0600  Gross per 24 hour  Intake 1936.67 ml  Output   3825 ml  Net -1888.33 ml    Intake/Output this shift:    Labs:  Recent Labs  02/15/14 0431 02/16/14 0444 02/17/14 0540  HGB 9.9* 8.4* 8.3*    Recent Labs  02/16/14 0444 02/17/14 0540  WBC 8.5 7.8  RBC 2.66* 2.68*  HCT 24.1* 24.0*  PLT 113* 120*    Recent Labs  02/16/14 0444 02/17/14 0540  NA 139 137  K 4.3 4.1  CL 104 101  CO2 26 27  BUN 19 17  CREATININE 0.90 0.85  GLUCOSE 128* 117*  CALCIUM 8.4 8.4    Recent Labs  02/16/14 0444 02/17/14 0540  INR 1.14 1.17    EXAM General - Patient is Alert and Oriented Extremity - Neurologically intact Intact pulses distally Dorsiflexion/Plantar flexion intact Compartment soft Dressing/Incision - clean, dry, no drainage Motor Function - intact, moving foot and toes well on exam.   Past Medical History  Diagnosis Date  . Allergy   . Colon polyp   . Hypercholesteremia     diet controlled  . Hypertension   . GERD (gastroesophageal reflux disease)   . History of shingles   . Hemorrhoids     hx of  . Pneumonia 2012    hx of  . H/O measles     as child  . H/O mumps     as child  . Arthritis     thumbs, knees, back  . Complication of  anesthesia     "hard to wake up after colonoscopy"  . Ringing in ears     Assessment/Plan: 3 Days Post-Op Procedure(s) (LRB): BILATERAL TOTAL KNEE ARTHROPLASTY (Bilateral) Principal Problem:   OA (osteoarthritis) of knee Active Problems:   Postoperative anemia due to acute blood loss  Estimated body mass index is 25.79 kg/(m^2) as calculated from the following:   Height as of this encounter: 5\' 5"  (1.651 m).   Weight as of this encounter: 70.308 kg (155 lb). Advance diet Up with therapy D/C IV fluids  DVT Prophylaxis - Lovenox and Coumadin Weight-Bearing as tolerated   Progressing somewhat slowly. Continue PT today. DC foley catheter once up with PT today. Plan for CIR Monday.   Ardeen Jourdain, PA-C Orthopaedic Surgery 02/17/2014, 9:07 AM

## 2014-02-17 NOTE — Progress Notes (Signed)
Physical Therapy Treatment Patient Details Name: James Atkinson MRN: [id number] DOB: [birth year] Today's Date: 02/17/2014    History of Present Illness Bil TKR    PT Comments    Improvement in activity tolerance since epidural d/c  Follow Up Recommendations  CIR     Equipment Recommendations  None recommended by PT    Recommendations for Other Services OT consult     Precautions / Restrictions Precautions Precautions: Knee;Fall Required Braces or Orthoses: Knee Immobilizer - Right;Knee Immobilizer - Left Knee Immobilizer - Right: Discontinue once straight leg raise with < 10 degree lag Knee Immobilizer - Left: Discontinue once straight leg raise with < 10 degree lag Restrictions Weight Bearing Restrictions: No Other Position/Activity Restrictions: WBAT    Mobility  Bed Mobility Overal bed mobility: Needs Assistance;+2 for physical assistance Bed Mobility: Supine to Sit     Supine to sit: Min assist;Mod assist;+2 for physical assistance     General bed mobility comments: cues for sequence with physical assist for LE management and to control trunk  Transfers Overall transfer level: Needs assistance Equipment used: Rolling walker (2 wheeled) Transfers: Sit to/from Stand Sit to Stand: Mod assist;+2 physical assistance;From elevated surface         General transfer comment: cues for LE management and use of UEs to self assist  Ambulation/Gait Ambulation/Gait assistance: Mod assist;+2 physical assistance Ambulation Distance (Feet): 10 Feet Assistive device: Rolling walker (2 wheeled) Gait Pattern/deviations: Step-to pattern;Decreased step length - right;Decreased step length - left;Shuffle;Trunk flexed Gait velocity: decr   General Gait Details: cues for posture, sequence, wt shift and position from RW; ltd by fatigue/pain   Stairs            Wheelchair Mobility    Modified Rankin (Stroke Patients Only)       Balance                                     Cognition Arousal/Alertness: Awake/alert Behavior During Therapy: WFL for tasks assessed/performed Overall Cognitive Status: Within Functional Limits for tasks assessed                      Exercises Total Joint Exercises Ankle Circles/Pumps: AROM;Both;15 reps;Supine Quad Sets: Both;AAROM;Supine;15 reps Heel Slides: AAROM;Both;Supine;20 reps Hip ABduction/ADduction: AAROM;Both;10 reps;Supine Straight Leg Raises: AAROM;Both;20 reps;Supine    General Comments        Pertinent Vitals/Pain Pain Assessment: 0-10 Pain Score: 6  Pain Location: Bil knees Pain Descriptors / Indicators: Aching Pain Intervention(s): Limited activity within patient's tolerance;Monitored during session;Premedicated before session;Ice applied    Home Living                      Prior Function            PT Goals (current goals can now be found in the care plan section) Acute Rehab PT Goals Patient Stated Goal: walk without pain PT Goal Formulation: With patient Time For Goal Achievement: 02/22/14 Potential to Achieve Goals: Good Progress towards PT goals: Progressing toward goals    Frequency  7X/week    PT Plan Current plan remains appropriate    Co-evaluation             End of Session Equipment Utilized During Treatment: Right knee immobilizer;Left knee immobilizer Activity Tolerance: Patient tolerated treatment well;Patient limited by fatigue;Patient limited by pain Patient left: with call bell/phone within reach;in chair  Time: 4562-5638 PT Time Calculation (min): 39 min  Charges:  $Gait Training: 8-22 mins $Therapeutic Exercise: 23-37 mins                    G Codes:      James Atkinson 03/15/14, 12:41 PM

## 2014-02-18 LAB — CBC
HCT: 22.4 % — ABNORMAL LOW (ref 39.0–52.0)
Hemoglobin: 7.7 g/dL — ABNORMAL LOW (ref 13.0–17.0)
MCH: 31 pg (ref 26.0–34.0)
MCHC: 34.4 g/dL (ref 30.0–36.0)
MCV: 90.3 fL (ref 78.0–100.0)
PLATELETS: 111 10*3/uL — AB (ref 150–400)
RBC: 2.48 MIL/uL — AB (ref 4.22–5.81)
RDW: 12.3 % (ref 11.5–15.5)
WBC: 5.6 10*3/uL (ref 4.0–10.5)

## 2014-02-18 LAB — PROTIME-INR
INR: 1.42 (ref 0.00–1.49)
Prothrombin Time: 17.4 seconds — ABNORMAL HIGH (ref 11.6–15.2)

## 2014-02-18 MED ORDER — WARFARIN SODIUM 7.5 MG PO TABS
7.5000 mg | ORAL_TABLET | Freq: Once | ORAL | Status: DC
  Filled 2014-02-18: qty 1

## 2014-02-18 NOTE — Progress Notes (Signed)
Physical Therapy Treatment Patient Details Name: James Atkinson MRN: [id number] DOB: 31-Oct-1945 Today's Date: 02/18/2014    History of Present Illness Bil TKR    PT Comments    Progressing well  Follow Up Recommendations  CIR     Equipment Recommendations  None recommended by PT    Recommendations for Other Services OT consult     Precautions / Restrictions Precautions Precautions: Knee;Fall Required Braces or Orthoses: Knee Immobilizer - Right;Knee Immobilizer - Left Knee Immobilizer - Right: Discontinue once straight leg raise with < 10 degree lag Knee Immobilizer - Left: Discontinue once straight leg raise with < 10 degree lag Restrictions Weight Bearing Restrictions: No Other Position/Activity Restrictions: WBAT    Mobility  Bed Mobility Overal bed mobility: Needs Assistance Bed Mobility: Sit to Supine       Sit to supine: Mod assist   General bed mobility comments: cues for sequence with physical assist for LE management  Transfers Overall transfer level: Needs assistance Equipment used: Rolling walker (2 wheeled) Transfers: Sit to/from Stand Sit to Stand: Mod assist;+2 physical assistance         General transfer comment: cues for LE management and use of UEs to self assist  Ambulation/Gait Ambulation/Gait assistance: Mod assist Ambulation Distance (Feet): 56 Feet (and 6 to [facility]) Assistive device: Rolling walker (2 wheeled) Gait Pattern/deviations: Step-to pattern;Decreased step length - right;Decreased step length - left;Shuffle;Trunk flexed Gait velocity: decr   General Gait Details: cues for posture, sequence, wt shift and position from [employer]    Modified Rankin (Stroke Patients Only)       Balance                                    Cognition Arousal/Alertness: Awake/alert Behavior During Therapy: WFL for tasks assessed/performed Overall Cognitive Status: Within Functional  Limits for tasks assessed                      Exercises      General Comments        Pertinent Vitals/Pain Pain Assessment: 0-10 Pain Score: 4  Pain Location: Bil knees Pain Descriptors / Indicators: Aching;Sore Pain Intervention(s): Limited activity within patient's tolerance;Repositioned;Ice applied;Premedicated before session;Monitored during session    Home Living                      Prior Function            PT Goals (current goals can now be found in the care plan section) Acute Rehab PT Goals Patient Stated Goal: walk without pain PT Goal Formulation: With patient Time For Goal Achievement: 02/22/14 Potential to Achieve Goals: Good Progress towards PT goals: Progressing toward goals    Frequency  7X/week    PT Plan Current plan remains appropriate    Co-evaluation             End of Session Equipment Utilized During Treatment: Right knee immobilizer;Left knee immobilizer;Gait belt Activity Tolerance: Patient tolerated treatment well Patient left: in bed;with call bell/phone within reach;with family/visitor present     Time: 1315-1411 PT Time Calculation (min): 56 min  Charges:  $Gait Training: 23-37 mins $Therapeutic Activity: 8-22 mins                    G Codes:  James Atkinson 02/18/2014, 2:30 PM

## 2014-02-18 NOTE — Progress Notes (Signed)
ANTICOAGULATION CONSULT NOTE - Follow Up Consult  Pharmacy Consult for Warfarin Indication: VTE prophylaxis  Allergies  Allergen Reactions  . Ciprofloxacin Nausea Only    Patient Measurements: Height: 5\' 5"  (165.1 cm) Weight: 155 lb (70.308 kg) IBW/kg (Calculated) : 61.5  Vital Signs:    Labs:  Recent Labs  02/16/14 0444 02/17/14 0540 02/18/14 0556  HGB 8.4* 8.3* 7.7*  HCT 24.1* 24.0* 22.4*  PLT 113* 120* 111*  LABPROT 14.6 14.9 17.4*  INR 1.14 1.17 1.42  CREATININE 0.90 0.85  --     Estimated Creatinine Clearance: 74.4 ml/min (by C-G formula based on Cr of 0.85).  Assessment: 67yoM s/p bilateral TKA, currently on ropivacaine epidural. Warfarin per Rx for VTE prophylaxis to begin tonight. Ortho planning on starting lovenox 12 hours after epidural has been pulled on 8/14.  INR 1.42 today - trending up  ABLA, Hgb down to 7.7 today (12.7 pre-op)  Plts baseline low, 111 today (144 pre-op)  No bleeding reported  No drug-drug interactions currently noted, however, would recommend holding PTA fish oil supplement and ibuprofen until warfarin therapy complete.  On regular diet but no appears intake down last 24h  Epidural removed, tip intact, 8/14 at 1505  Warfarin education provided on 8/14  On enoxaparin 30mg  SQ q12h  Goal of Therapy:  INR 2-3 Monitor platelets by anticoagulation protocol: Yes   Plan:   Warfarin 7.5 mg PO x 1 today at 18:00 as INR currently trending up niced toward therapeutic range  Daily PT/INR  Follow up plans for Lovenox orders (no order comments to d/c when INR therapeutic)  Doreene Eland, PharmD, BCPS.   Pager: [phone] 02/18/2014 11:30 AM

## 2014-02-18 NOTE — Progress Notes (Signed)
Physical Therapy Treatment Patient Details Name: James Atkinson MRN: [id number] DOB: Jul 01, 1946 Today's Date: 02/18/2014    History of Present Illness Bil TKR    PT Comments    Progressing well with marked ambulation tolerance  Follow Up Recommendations  CIR     Equipment Recommendations  None recommended by PT    Recommendations for Other Services OT consult     Precautions / Restrictions Precautions Precautions: Knee;Fall Required Braces or Orthoses: Knee Immobilizer - Right;Knee Immobilizer - Left Knee Immobilizer - Right: Discontinue once straight leg raise with < 10 degree lag Knee Immobilizer - Left: Discontinue once straight leg raise with < 10 degree lag Restrictions Weight Bearing Restrictions: No Other Position/Activity Restrictions: WBAT    Mobility  Bed Mobility Overal bed mobility: Needs Assistance Bed Mobility: Supine to Sit     Supine to sit: Mod assist     General bed mobility comments: cues for sequence with physical assist for LE management  Transfers Overall transfer level: Needs assistance Equipment used: Rolling walker (2 wheeled) Transfers: Sit to/from Stand Sit to Stand: Mod assist;+2 physical assistance;From elevated surface         General transfer comment: cues for LE management and use of UEs to self assist  Ambulation/Gait Ambulation/Gait assistance: Mod assist Ambulation Distance (Feet): 42 Feet Assistive device: Rolling walker (2 wheeled) Gait Pattern/deviations: Step-to pattern;Decreased step length - right;Decreased step length - left;Shuffle;Trunk flexed Gait velocity: decr   General Gait Details: cues for posture, sequence, wt shift and position from [employer]    Modified Rankin (Stroke Patients Only)       Balance                                    Cognition Arousal/Alertness: Awake/alert Behavior During Therapy: WFL for tasks  assessed/performed Overall Cognitive Status: Within Functional Limits for tasks assessed                      Exercises Total Joint Exercises Ankle Circles/Pumps: AROM;Both;15 reps;Supine Quad Sets: Both;AAROM;Supine;20 reps Heel Slides: AAROM;Both;Supine;20 reps Hip ABduction/ADduction: AAROM;Both;10 reps;Supine Straight Leg Raises: AAROM;Both;20 reps;Supine Goniometric ROM: AAROM R knee -10 - 55; L knee -10-65    General Comments        Pertinent Vitals/Pain Pain Assessment: 0-10 Pain Score: 5  Pain Location: Bil knees Pain Descriptors / Indicators: Aching;Sore Pain Intervention(s): Limited activity within patient's tolerance;Monitored during session;Premedicated before session;Ice applied    Home Living                      Prior Function            PT Goals (current goals can now be found in the care plan section) Acute Rehab PT Goals Patient Stated Goal: walk without pain PT Goal Formulation: With patient Time For Goal Achievement: 02/22/14 Potential to Achieve Goals: Good Progress towards PT goals: Progressing toward goals    Frequency  7X/week    PT Plan Current plan remains appropriate    Co-evaluation             End of Session Equipment Utilized During Treatment: Right knee immobilizer;Left knee immobilizer;Gait belt Activity Tolerance: Patient tolerated treatment well Patient left: in chair;with call bell/phone within reach     Time: 0923-1010 PT Time Calculation (min): 47  min  Charges:  $Gait Training: 8-22 mins $Therapeutic Exercise: 23-37 mins                    G Codes:      [name] 17-Mar-2014, 12:30 PM

## 2014-02-18 NOTE — Progress Notes (Signed)
James Atkinson  MRN: [id number] DOB/Age: Feb 18, 1946 68 y.o. Physician: Rada Hay Procedure: Procedure(s) (LRB): BILATERAL TOTAL KNEE ARTHROPLASTY (Bilateral)     Subjective: Day 5 from bilateral TKA, pain control improving but still limited mobility. Has not had BM yet, passing flatus  Vital Signs Temp:  [98.8 F (37.1 C)] 98.8 F (37.1 C) (08/15 1530) Pulse Rate:  [73-95] 95 (08/15 2025) Resp:  [18-20] 20 (08/15 2025) BP: (120-121)/(56-62) 121/62 mmHg (08/15 2025) SpO2:  [90 %-96 %] 90 % (08/15 2025)  Lab Results  Recent Labs  02/17/14 0540 02/18/14 0556  WBC 7.8 5.6  HGB 8.3* 7.7*  HCT 24.0* 22.4*  PLT 120* 111*   BMET  Recent Labs  02/16/14 0444 02/17/14 0540  NA 139 137  K 4.3 4.1  CL 104 101  CO2 26 27  GLUCOSE 128* 117*  BUN 19 17  CREATININE 0.90 0.85  CALCIUM 8.4 8.4   INR  Date Value Ref Range Status  02/18/2014 1.42  0.00 - 1.49 Final     Exam Bilateral LE incisions clean and dry Minimal swelling Moving feet well Abdomen soft but mildly distended        Plan Spoke with nursing who are going to work on bowels today Otherwise continue therapy with goal of transfer to [place] for Dr.Kevin Supple 02/18/2014, 8:18 AM

## 2014-02-19 ENCOUNTER — Inpatient Hospital Stay (HOSPITAL_COMMUNITY)
Admission: RE | Admit: 2014-02-19 | Discharge: 2014-02-27 | DRG: 945 | Disposition: A | Discharge status: Home Health | Payer: BC Managed Care – PPO | Source: Intra-hospital | Attending: Physical Medicine & Rehabilitation | Admitting: Physical Medicine & Rehabilitation

## 2014-02-19 DIAGNOSIS — K219 Gastro-esophageal reflux disease without esophagitis: Secondary | ICD-10-CM | POA: Diagnosis present

## 2014-02-19 DIAGNOSIS — Z79899 Other long term (current) drug therapy: Secondary | ICD-10-CM

## 2014-02-19 DIAGNOSIS — R110 Nausea: Secondary | ICD-10-CM | POA: Diagnosis present

## 2014-02-19 DIAGNOSIS — I10 Essential (primary) hypertension: Secondary | ICD-10-CM | POA: Diagnosis present

## 2014-02-19 DIAGNOSIS — Z800 Family history of malignant neoplasm of digestive organs: Secondary | ICD-10-CM

## 2014-02-19 DIAGNOSIS — Z5189 Encounter for other specified aftercare: Principal | ICD-10-CM

## 2014-02-19 DIAGNOSIS — Z801 Family history of malignant neoplasm of trachea, bronchus and lung: Secondary | ICD-10-CM

## 2014-02-19 DIAGNOSIS — Z7901 Long term (current) use of anticoagulants: Secondary | ICD-10-CM | POA: Diagnosis not present

## 2014-02-19 DIAGNOSIS — Z96659 Presence of unspecified artificial knee joint: Secondary | ICD-10-CM

## 2014-02-19 DIAGNOSIS — Z881 Allergy status to other antibiotic agents status: Secondary | ICD-10-CM | POA: Diagnosis not present

## 2014-02-19 DIAGNOSIS — I959 Hypotension, unspecified: Secondary | ICD-10-CM | POA: Diagnosis not present

## 2014-02-19 DIAGNOSIS — M1710 Unilateral primary osteoarthritis, unspecified knee: Secondary | ICD-10-CM

## 2014-02-19 DIAGNOSIS — E7800 Pure hypercholesterolemia, unspecified: Secondary | ICD-10-CM | POA: Diagnosis present

## 2014-02-19 DIAGNOSIS — K5900 Constipation, unspecified: Secondary | ICD-10-CM | POA: Diagnosis present

## 2014-02-19 DIAGNOSIS — M170 Bilateral primary osteoarthritis of knee: Secondary | ICD-10-CM

## 2014-02-19 DIAGNOSIS — IMO0002 Reserved for concepts with insufficient information to code with codable children: Secondary | ICD-10-CM | POA: Diagnosis not present

## 2014-02-19 DIAGNOSIS — Z87891 Personal history of nicotine dependence: Secondary | ICD-10-CM

## 2014-02-19 DIAGNOSIS — R1310 Dysphagia, unspecified: Secondary | ICD-10-CM | POA: Diagnosis present

## 2014-02-19 DIAGNOSIS — M179 Osteoarthritis of knee, unspecified: Secondary | ICD-10-CM | POA: Diagnosis present

## 2014-02-19 DIAGNOSIS — D62 Acute posthemorrhagic anemia: Secondary | ICD-10-CM | POA: Diagnosis not present

## 2014-02-19 DIAGNOSIS — Z836 Family history of other diseases of the respiratory system: Secondary | ICD-10-CM

## 2014-02-19 DIAGNOSIS — M7989 Other specified soft tissue disorders: Secondary | ICD-10-CM | POA: Diagnosis not present

## 2014-02-19 LAB — PROTIME-INR
INR: 1.46 (ref 0.00–1.49)
Prothrombin Time: 17.7 seconds — ABNORMAL HIGH (ref 11.6–15.2)

## 2014-02-19 MED ORDER — TRAMADOL HCL 50 MG PO TABS: 50.0000 mg | ORAL_TABLET | Freq: Four times a day (QID) | ORAL | Status: DC | PRN

## 2014-02-19 MED ORDER — FLEET ENEMA 7-19 GM/118ML RE ENEM: 1.0000 | ENEMA | Freq: Once | RECTAL | Status: AC | PRN

## 2014-02-19 MED ORDER — PANTOPRAZOLE SODIUM 40 MG PO TBEC
40.0000 mg | DELAYED_RELEASE_TABLET | Freq: Every day | ORAL | Status: DC
  Administered 2014-02-20 – 2014-02-27 (×8): 40 mg via ORAL
  Filled 2014-02-19 (×10): qty 1

## 2014-02-19 MED ORDER — TRAMADOL HCL 50 MG PO TABS
50.0000 mg | ORAL_TABLET | Freq: Four times a day (QID) | ORAL | Status: DC | PRN
  Administered 2014-02-21 – 2014-02-26 (×7): 50 mg via ORAL
  Filled 2014-02-19 (×7): qty 1

## 2014-02-19 MED ORDER — ALUM & MAG HYDROXIDE-SIMETH 200-200-20 MG/5ML PO SUSP: 30.0000 mL | ORAL | Status: DC | PRN

## 2014-02-19 MED ORDER — LISINOPRIL 20 MG PO TABS
20.0000 mg | ORAL_TABLET | Freq: Every morning | ORAL | Status: DC
  Administered 2014-02-21 – 2014-02-26 (×5): 20 mg via ORAL
  Filled 2014-02-19 (×8): qty 1

## 2014-02-19 MED ORDER — WARFARIN SODIUM 7.5 MG PO TABS
7.5000 mg | ORAL_TABLET | Freq: Once | ORAL | Status: AC
  Administered 2014-02-19: 7.5 mg via ORAL
  Filled 2014-02-19: qty 1

## 2014-02-19 MED ORDER — WARFARIN - PHARMACIST DOSING INPATIENT: Freq: Every day | Status: DC

## 2014-02-19 MED ORDER — WARFARIN SODIUM 7.5 MG PO TABS: 7.5000 mg | ORAL_TABLET | Freq: Once | ORAL | Status: DC

## 2014-02-19 MED ORDER — DSS 100 MG PO CAPS: 100.0000 mg | ORAL_CAPSULE | Freq: Two times a day (BID) | ORAL | Status: DC

## 2014-02-19 MED ORDER — ENOXAPARIN SODIUM 30 MG/0.3ML ~~LOC~~ SOLN: 30.0000 mg | Freq: Two times a day (BID) | SUBCUTANEOUS | Status: DC

## 2014-02-19 MED ORDER — NAPHAZOLINE-PHENIRAMINE 0.025-0.3 % OP SOLN
1.0000 [drp] | Freq: Four times a day (QID) | OPHTHALMIC | Status: DC | PRN
Start: 2014-02-19 — End: 2014-02-27

## 2014-02-19 MED ORDER — METHOCARBAMOL 500 MG PO TABS
500.0000 mg | ORAL_TABLET | Freq: Four times a day (QID) | ORAL | Status: DC | PRN
  Administered 2014-02-19 – 2014-02-26 (×6): 500 mg via ORAL
  Filled 2014-02-19 (×7): qty 1

## 2014-02-19 MED ORDER — ACETAMINOPHEN 325 MG PO TABS: 325.0000 mg | ORAL_TABLET | ORAL | Status: DC | PRN

## 2014-02-19 MED ORDER — WARFARIN SODIUM 7.5 MG PO TABS
7.5000 mg | ORAL_TABLET | Freq: Once | ORAL | Status: DC
  Filled 2014-02-19: qty 1

## 2014-02-19 MED ORDER — POLYETHYLENE GLYCOL 3350 17 G PO PACK
17.0000 g | PACK | Freq: Two times a day (BID) | ORAL | Status: DC
  Administered 2014-02-19 – 2014-02-26 (×14): 17 g via ORAL
  Filled 2014-02-19 (×19): qty 1

## 2014-02-19 MED ORDER — POLYSACCHARIDE IRON COMPLEX 150 MG PO CAPS: 150.0000 mg | ORAL_CAPSULE | Freq: Two times a day (BID) | ORAL | Status: DC

## 2014-02-19 MED ORDER — OXYCODONE HCL 5 MG PO TABS
10.0000 mg | ORAL_TABLET | ORAL | Status: DC | PRN
  Administered 2014-02-19 – 2014-02-20 (×2): 15 mg via ORAL
  Administered 2014-02-20 – 2014-02-21 (×4): 10 mg via ORAL
  Administered 2014-02-21: 15 mg via ORAL
  Administered 2014-02-21 – 2014-02-27 (×16): 10 mg via ORAL
  Filled 2014-02-19 (×3): qty 2
  Filled 2014-02-19 (×2): qty 3
  Filled 2014-02-19 (×11): qty 2
  Filled 2014-02-19: qty 3
  Filled 2014-02-19 (×7): qty 2

## 2014-02-19 MED ORDER — ENOXAPARIN SODIUM 30 MG/0.3ML ~~LOC~~ SOLN
30.0000 mg | Freq: Two times a day (BID) | SUBCUTANEOUS | Status: DC
  Administered 2014-02-19 – 2014-02-23 (×9): 30 mg via SUBCUTANEOUS
  Filled 2014-02-19 (×13): qty 0.3

## 2014-02-19 MED ORDER — TRAZODONE HCL 50 MG PO TABS: 25.0000 mg | ORAL_TABLET | Freq: Every evening | ORAL | Status: DC | PRN

## 2014-02-19 MED ORDER — POLYSACCHARIDE IRON COMPLEX 150 MG PO CAPS
150.0000 mg | ORAL_CAPSULE | Freq: Two times a day (BID) | ORAL | Status: DC
  Administered 2014-02-19 – 2014-02-27 (×16): 150 mg via ORAL
  Filled 2014-02-19 (×20): qty 1

## 2014-02-19 MED ORDER — DIPHENHYDRAMINE HCL 12.5 MG/5ML PO ELIX: 12.5000 mg | ORAL_SOLUTION | Freq: Four times a day (QID) | ORAL | Status: DC | PRN

## 2014-02-19 MED ORDER — PROCHLORPERAZINE EDISYLATE 5 MG/ML IJ SOLN
5.0000 mg | Freq: Four times a day (QID) | INTRAMUSCULAR | Status: DC | PRN
  Filled 2014-02-19: qty 2

## 2014-02-19 MED ORDER — GUAIFENESIN-DM 100-10 MG/5ML PO SYRP: 5.0000 mL | ORAL_SOLUTION | Freq: Four times a day (QID) | ORAL | Status: DC | PRN

## 2014-02-19 MED ORDER — PROCHLORPERAZINE MALEATE 5 MG PO TABS
5.0000 mg | ORAL_TABLET | Freq: Four times a day (QID) | ORAL | Status: DC | PRN
  Filled 2014-02-19: qty 2

## 2014-02-19 MED ORDER — BISACODYL 10 MG RE SUPP
10.0000 mg | Freq: Every day | RECTAL | Status: DC | PRN
  Administered 2014-02-20: 10 mg via RECTAL
  Filled 2014-02-19 (×2): qty 1

## 2014-02-19 MED ORDER — OXYCODONE HCL 5 MG PO TABS: 5.0000 mg | ORAL_TABLET | ORAL | Status: DC | PRN

## 2014-02-19 MED ORDER — PROCHLORPERAZINE 25 MG RE SUPP
12.5000 mg | Freq: Four times a day (QID) | RECTAL | Status: DC | PRN
  Filled 2014-02-19: qty 1

## 2014-02-19 MED ORDER — POLYETHYLENE GLYCOL 3350 17 G PO PACK
17.0000 g | PACK | Freq: Every day | ORAL | Status: DC | PRN
Start: 2014-02-19 — End: 2014-02-27

## 2014-02-19 MED ORDER — METHOCARBAMOL 500 MG PO TABS: 500.0000 mg | ORAL_TABLET | Freq: Four times a day (QID) | ORAL | Status: DC | PRN

## 2014-02-19 NOTE — Discharge Summary (Signed)
Physician Discharge Summary   Patient ID: James Atkinson MRN: [id number] DOB/AGE: Apr 01, 1946 68 y.o.  Admit date: 02/14/2014 Discharge date: 02/19/2014  Primary Diagnosis: Osteoarthritis, bilateral knees  Admission Diagnoses:  Past Medical History  Diagnosis Date  . Allergy   . Colon polyp   . Hypercholesteremia     diet controlled  . Hypertension   . GERD (gastroesophageal reflux disease)   . History of shingles   . Hemorrhoids     hx of  . Pneumonia 2012    hx of  . H/O measles     as child  . H/O mumps     as child  . Arthritis     thumbs, knees, back  . Complication of anesthesia     "hard to wake up after colonoscopy"  . Ringing in ears    Discharge Diagnoses:   Principal Problem:   OA (osteoarthritis) of knee Active Problems:   Postoperative anemia due to acute blood loss  Estimated body mass index is 25.79 kg/(m^2) as calculated from the following:   Height as of this encounter: 5' 5" (1.651 m).   Weight as of this encounter: 70.308 kg (155 lb).  Procedure:  Procedure(s) (LRB): BILATERAL TOTAL KNEE ARTHROPLASTY (Bilateral)   Consults: None  HPI: James Atkinson presented with the chief complaint of bilateral knee pain. He was found to have bialteral knee osteoarthritis. The right knee is already bone on bone and the left knee is very close behind. 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 It was recommended to undergo bilateral knees at his last visit and came back in to discuss this with James Atkinson. He would like to look into inpatient rehab following surgery as not to put any stress on his wife during his reahabilitation. He unfortunately has had progressively worsening pain and dysfunction over time. The knees are bothering him at all times. It is limiting what he can and cannot do. He saw James Atkinson last week and bilateral total knee arthroplasty was discussed. He is here today to  discuss that and to go over details. He has not had injections recently but injections at this stage are unlikely to impact him given the significant arthritis and significant dysfunction. They have been treated conservatively in the past for the above stated problem and despite conservative measures, they continue to have progressive pain and severe functional limitations and dysfunction. They have failed non-operative management including home exercise, medications, and injections. It is felt that they would benefit from undergoing bilateral total joint replacements. Risks and benefits of the procedure have been discussed with the patient and they elect to proceed with surgery. There are no active contraindications to surgery such as ongoing infection or rapidly progressive neurological disease.  Laboratory Data: Admission on 02/14/2014  Component Date Value Ref Range Status  . ABO/RH(D) 02/14/2014 O POS   Final  . Antibody Screen 02/14/2014 NEG   Final  . Sample Expiration 02/14/2014 02/17/2014   Final  . ABO/RH(D) 02/14/2014 O POS   Final  . WBC 02/15/2014 7.3  4.0 - 10.5 K/uL Final  . RBC 02/15/2014 3.20* 4.22 - 5.81 MIL/uL Final  . Hemoglobin 02/15/2014 9.9* 13.0 - 17.0 g/dL Final  . HCT 02/15/2014 28.7* 39.0 - 52.0 % Final  . MCV 02/15/2014 89.7  78.0 - 100.0 fL Final  . MCH 02/15/2014 30.9  26.0 - 34.0 pg Final  . MCHC 02/15/2014 34.5  30.0 - 36.0 g/dL Final  . RDW 02/15/2014 12.2  11.5 - 15.5 % Final  . Platelets 02/15/2014 139* 150 - 400 K/uL Final  . Sodium 02/15/2014 140  137 - 147 mEq/L Final  . Potassium 02/15/2014 4.7  3.7 - 5.3 mEq/L Final  . Chloride 02/15/2014 107  96 - 112 mEq/L Final  . CO2 02/15/2014 24  19 - 32 mEq/L Final  . Glucose, Bld 02/15/2014 152* 70 - 99 mg/dL Final  . BUN 02/15/2014 17  6 - 23 mg/dL Final  . Creatinine, Ser 02/15/2014 0.88  0.50 - 1.35 mg/dL Final  . Calcium 02/15/2014 8.8  8.4 - 10.5 mg/dL Final  . GFR calc non Af Amer 02/15/2014 88* >90  mL/min Final  . GFR calc Af Amer 02/15/2014 >90  >90 mL/min Final   Comment: (NOTE)                          The eGFR has been calculated using the CKD EPI equation.                          This calculation has not been validated in all clinical situations.                          eGFR's persistently <90 mL/min signify possible Chronic Kidney                          Disease.  . Anion gap 02/15/2014 9  5 - 15 Final  . Prothrombin Time 02/15/2014 14.5  11.6 - 15.2 seconds Final  . INR 02/15/2014 1.13  0.00 - 1.49 Final  . WBC 02/16/2014 8.5  4.0 - 10.5 K/uL Final  . RBC 02/16/2014 2.66* 4.22 - 5.81 MIL/uL Final  . Hemoglobin 02/16/2014 8.4* 13.0 - 17.0 g/dL Final  . HCT 02/16/2014 24.1* 39.0 - 52.0 % Final  . MCV 02/16/2014 90.6  78.0 - 100.0 fL Final  . MCH 02/16/2014 31.6  26.0 - 34.0 pg Final  . MCHC 02/16/2014 34.9  30.0 - 36.0 g/dL Final  . RDW 02/16/2014 12.6  11.5 - 15.5 % Final  . Platelets 02/16/2014 113* 150 - 400 K/uL Final   Comment: SPECIMEN CHECKED FOR CLOTS                          PLATELET COUNT CONFIRMED BY SMEAR  . Sodium 02/16/2014 139  137 - 147 mEq/L Final  . Potassium 02/16/2014 4.3  3.7 - 5.3 mEq/L Final  . Chloride 02/16/2014 104  96 - 112 mEq/L Final  . CO2 02/16/2014 26  19 - 32 mEq/L Final  . Glucose, Bld 02/16/2014 128* 70 - 99 mg/dL Final  . BUN 02/16/2014 19  6 - 23 mg/dL Final  . Creatinine, Ser 02/16/2014 0.90  0.50 - 1.35 mg/dL Final  . Calcium 02/16/2014 8.4  8.4 - 10.5 mg/dL Final  . GFR calc non Af Amer 02/16/2014 87* >90 mL/min Final  . GFR calc Af Amer 02/16/2014 >90  >90 mL/min Final   Comment: (NOTE)                          The eGFR has been calculated using the CKD EPI equation.  This calculation has not been validated in all clinical situations.                          eGFR's persistently <90 mL/min signify possible Chronic Kidney                          Disease.  . Anion gap 02/16/2014 9  5 - 15 Final  .  Prothrombin Time 02/16/2014 14.6  11.6 - 15.2 seconds Final  . INR 02/16/2014 1.14  0.00 - 1.49 Final  . WBC 02/17/2014 7.8  4.0 - 10.5 K/uL Final  . RBC 02/17/2014 2.68* 4.22 - 5.81 MIL/uL Final  . Hemoglobin 02/17/2014 8.3* 13.0 - 17.0 g/dL Final  . HCT 02/17/2014 24.0* 39.0 - 52.0 % Final  . MCV 02/17/2014 89.6  78.0 - 100.0 fL Final  . MCH 02/17/2014 31.0  26.0 - 34.0 pg Final  . MCHC 02/17/2014 34.6  30.0 - 36.0 g/dL Final  . RDW 02/17/2014 12.5  11.5 - 15.5 % Final  . Platelets 02/17/2014 120* 150 - 400 K/uL Final  . Sodium 02/17/2014 137  137 - 147 mEq/L Final  . Potassium 02/17/2014 4.1  3.7 - 5.3 mEq/L Final  . Chloride 02/17/2014 101  96 - 112 mEq/L Final  . CO2 02/17/2014 27  19 - 32 mEq/L Final  . Glucose, Bld 02/17/2014 117* 70 - 99 mg/dL Final  . BUN 02/17/2014 17  6 - 23 mg/dL Final  . Creatinine, Ser 02/17/2014 0.85  0.50 - 1.35 mg/dL Final  . Calcium 02/17/2014 8.4  8.4 - 10.5 mg/dL Final  . GFR calc non Af Amer 02/17/2014 89* >90 mL/min Final  . GFR calc Af Amer 02/17/2014 >90  >90 mL/min Final   Comment: (NOTE)                          The eGFR has been calculated using the CKD EPI equation.                          This calculation has not been validated in all clinical situations.                          eGFR's persistently <90 mL/min signify possible Chronic Kidney                          Disease.  . Anion gap 02/17/2014 9  5 - 15 Final  . Prothrombin Time 02/17/2014 14.9  11.6 - 15.2 seconds Final  . INR 02/17/2014 1.17  0.00 - 1.49 Final  . WBC 02/18/2014 5.6  4.0 - 10.5 K/uL Final  . RBC 02/18/2014 2.48* 4.22 - 5.81 MIL/uL Final  . Hemoglobin 02/18/2014 7.7* 13.0 - 17.0 g/dL Final  . HCT 02/18/2014 22.4* 39.0 - 52.0 % Final  . MCV 02/18/2014 90.3  78.0 - 100.0 fL Final  . MCH 02/18/2014 31.0  26.0 - 34.0 pg Final  . MCHC 02/18/2014 34.4  30.0 - 36.0 g/dL Final  . RDW 02/18/2014 12.3  11.5 - 15.5 % Final  . Platelets 02/18/2014 111* 150 - 400 K/uL Final     CONSISTENT WITH PREVIOUS RESULT  . Prothrombin Time 02/18/2014 17.4* 11.6 - 15.2 seconds Final  . INR 02/18/2014 1.42  0.00 - 1.49  Final  . Prothrombin Time 02/19/2014 17.7* 11.6 - 15.2 seconds Final  . INR 02/19/2014 1.46  0.00 - 1.49 Final   Performed at [facility] Outpatient Visit on 02/05/2014  Component Date Value Ref Range Status  . Color, Urine 02/05/2014 YELLOW  YELLOW Final  . APPearance 02/05/2014 CLEAR  CLEAR Final  . Specific Gravity, Urine 02/05/2014 1.013  1.005 - 1.030 Final  . pH 02/05/2014 6.0  5.0 - 8.0 Final  . Glucose, UA 02/05/2014 NEGATIVE  NEGATIVE mg/dL Final  . Hgb urine dipstick 02/05/2014 NEGATIVE  NEGATIVE Final  . Bilirubin Urine 02/05/2014 NEGATIVE  NEGATIVE Final  . Ketones, ur 02/05/2014 NEGATIVE  NEGATIVE mg/dL Final  . Protein, ur 02/05/2014 NEGATIVE  NEGATIVE mg/dL Final  . Urobilinogen, UA 02/05/2014 0.2  0.0 - 1.0 mg/dL Final  . Nitrite 02/05/2014 NEGATIVE  NEGATIVE Final  . Leukocytes, UA 02/05/2014 NEGATIVE  NEGATIVE Final   MICROSCOPIC NOT DONE ON URINES WITH NEGATIVE PROTEIN, BLOOD, LEUKOCYTES, NITRITE, OR GLUCOSE <1000 mg/dL.  Marland Kitchen MRSA, PCR 02/05/2014 NEGATIVE  NEGATIVE Final  . Staphylococcus aureus 02/05/2014 POSITIVE* NEGATIVE Final   Comment:                                 The Xpert SA Assay (FDA                          approved for NASAL specimens                          in patients over 87 years of age),                          is one component of                          a comprehensive surveillance                          program.  Test performance has                          been validated by [employer] for patients greater                          than or equal to 50 year old.                          It is not intended                          to diagnose infection nor to                          guide or monitor treatment.  Marland Kitchen aPTT 02/05/2014 28  24 - 37 seconds Final  . WBC  02/05/2014 3.8* 4.0 - 10.5 K/uL Final  . RBC 02/05/2014 4.10* 4.22 - 5.81 MIL/uL Final  . Hemoglobin 02/05/2014 12.7* 13.0 - 17.0 g/dL  Final  . HCT 02/05/2014 37.2* 39.0 - 52.0 % Final  . MCV 02/05/2014 90.7  78.0 - 100.0 fL Final  . MCH 02/05/2014 31.0  26.0 - 34.0 pg Final  . MCHC 02/05/2014 34.1  30.0 - 36.0 g/dL Final  . RDW 02/05/2014 12.5  11.5 - 15.5 % Final  . Platelets 02/05/2014 144* 150 - 400 K/uL Final  . Sodium 02/05/2014 139  137 - 147 mEq/L Final  . Potassium 02/05/2014 5.1  3.7 - 5.3 mEq/L Final  . Chloride 02/05/2014 103  96 - 112 mEq/L Final  . CO2 02/05/2014 28  19 - 32 mEq/L Final  . Glucose, Bld 02/05/2014 115* 70 - 99 mg/dL Final  . BUN 02/05/2014 21  6 - 23 mg/dL Final  . Creatinine, Ser 02/05/2014 0.87  0.50 - 1.35 mg/dL Final  . Calcium 02/05/2014 9.9  8.4 - 10.5 mg/dL Final  . Total Protein 02/05/2014 6.7  6.0 - 8.3 g/dL Final  . Albumin 02/05/2014 3.7  3.5 - 5.2 g/dL Final  . AST 02/05/2014 18  0 - 37 U/L Final  . ALT 02/05/2014 17  0 - 53 U/L Final  . Alkaline Phosphatase 02/05/2014 69  39 - 117 U/L Final  . Total Bilirubin 02/05/2014 0.4  0.3 - 1.2 mg/dL Final  . GFR calc non Af Amer 02/05/2014 88* >90 mL/min Final  . GFR calc Af Amer 02/05/2014 >90  >90 mL/min Final   Comment: (NOTE)                          The eGFR has been calculated using the CKD EPI equation.                          This calculation has not been validated in all clinical situations.                          eGFR's persistently <90 mL/min signify possible Chronic Kidney                          Disease.  . Anion gap 02/05/2014 8  5 - 15 Final  . Prothrombin Time 02/05/2014 13.1  11.6 - 15.2 seconds Final  . INR 02/05/2014 0.99  0.00 - 1.49 Final     X-Rays:Dg Chest 2 View  02/05/2014   CLINICAL DATA:  Pre-op respiratory examination for bilateral total knee arthroplasty. History of hypertension.  EXAM: CHEST  2 VIEW  COMPARISON:  09/26/2009.  FINDINGS: The heart size and  mediastinal contours are stable. The lungs are clear. There is no pleural effusion or pneumothorax. Postsurgical changes are noted at the distal left clavicle. There are prominent nipple shadows bilaterally. Radiodensity overlapping the left upper quadrant of the abdomen may reflect a left renal calculus, grossly unchanged.  IMPRESSION: No active cardiopulmonary process.  Possible left renal calculus.   Electronically Signed   By: Camie Patience M.D.   On: 02/05/2014 11:55    EKG: Orders placed during the hospital encounter of 02/05/14  . EKG 12-LEAD  . EKG 12-LEAD     Hospital Course: ETTORE TREBILCOCK is a 68 y.o. who was admitted to [facility]. They were brought to the operating room on 02/14/2014 and underwent Procedure(s): BILATERAL TOTAL KNEE ARTHROPLASTY.  Patient tolerated the procedure well and was later transferred to the recovery room  and then to the orthopaedic floor for postoperative care.  They were given PO and IV analgesics for pain control following their surgery.  They were given 24 hours of postoperative antibiotics of  Anti-infectives   Start     Dose/Rate Route Frequency Ordered Stop   02/14/14 1800  ceFAZolin (ANCEF) IVPB 2 g/50 mL premix     2 g 100 mL/hr over 30 Minutes Intravenous Every 6 hours 02/14/14 1531 02/15/14 0039   02/14/14 0857  ceFAZolin (ANCEF) IVPB 2 g/50 mL premix     2 g 100 mL/hr over 30 Minutes Intravenous On call to O.R. 02/14/14 8469 02/14/14 1125     and started on DVT prophylaxis in the form of Lovenox and Coumadin.   PT and OT were ordered for total joint protocol.  Discharge planning consulted to help with postop disposition and equipment needs.  Patient had a fair night on the evening of surgery.  They started to get up OOB with therapy on day one. Hemovac drain was pulled without difficulty.  Continued to work with therapy into day two. Epidural removed by anesthesia post op day 2.  Dressing was changed on day two and the incision was clean  and dry.  Patient continued PT post op day three through five. Developed acute blood loss anemia but was managed with iron supplementation. Patient voiding well and positive BM. Patient was seen post op day five and was prepared for DC to CIR.    Diet: Regular diet Activity:WBAT Follow-up:in 10 days Disposition - Rehab Discharged Condition: stable   Discharge Instructions   Call MD / Call 911    Complete by:  As directed   If you experience chest pain or shortness of breath, CALL 911 and be transported to the hospital emergency room.  If you develope a fever above 101 F, pus (white drainage) or increased drainage or redness at the wound, or calf pain, call your surgeon's office.     Constipation Prevention    Complete by:  As directed   Drink plenty of fluids.  Prune juice may be helpful.  You may use a stool softener, such as Colace (over the counter) 100 mg twice a day.  Use MiraLax (over the counter) for constipation as needed.     Diet general    Complete by:  As directed      Discharge instructions    Complete by:  As directed   Walk with your walker Weight bearing as tolerated. Change your dressing daily as needed Shower only, no tub bath. Call if any temperatures greater than 101 or any wound complications: [phone]     Do not put a pillow under the knee. Place it under the heel.    Complete by:  As directed      Driving restrictions    Complete by:  As directed   No driving     Increase activity slowly as tolerated    Complete by:  As directed      TED hose    Complete by:  As directed   Use stockings (TED hose) for 3 weeks on both leg(s).  You may remove them at night for sleeping.            Medication List    STOP taking these medications       Cinnamon 500 MG capsule     FISH OIL PO     FISH OIL-FLAX OIL-BORAGE OIL PO     ibuprofen 200 MG  tablet  Commonly known as:  ADVIL,MOTRIN     multivitamin with minerals Tabs tablet      TAKE these medications         DSS 100 MG Caps  Take 100 mg by mouth 2 (two) times daily.     enoxaparin 30 MG/0.3ML injection  Commonly known as:  LOVENOX  Inject 0.3 mLs (30 mg total) into the skin every 12 (twelve) hours.     EYE ALLERGY RELIEF 0.025-0.3 % ophthalmic solution  Generic drug:  naphazoline-pheniramine  Place 1 drop into both eyes 4 (four) times daily as needed for irritation.     iron polysaccharides 150 MG capsule  Commonly known as:  NIFEREX  Take 1 capsule (150 mg total) by mouth 2 (two) times daily.     lisinopril 20 MG tablet  Commonly known as:  PRINIVIL,ZESTRIL  Take 20 mg by mouth every morning.     methocarbamol 500 MG tablet  Commonly known as:  ROBAXIN  Take 1 tablet (500 mg total) by mouth every 6 (six) hours as needed for muscle spasms.     OVER THE COUNTER MEDICATION  Take 1 tablet by mouth 2 (two) times daily. Beta Prostate     oxyCODONE 5 MG immediate release tablet  Commonly known as:  Oxy IR/ROXICODONE  Take 1-4 tablets (5-20 mg total) by mouth every 3 (three) hours as needed for breakthrough pain.     pantoprazole 40 MG tablet  Commonly known as:  PROTONIX  Take 1 tablet (40 mg total) by mouth daily.     polyethylene glycol packet  Commonly known as:  MIRALAX / GLYCOLAX  Take 17 g by mouth daily as needed for mild constipation.     traMADol 50 MG tablet  Commonly known as:  ULTRAM  Take 1-2 tablets (50-100 mg total) by mouth every 6 (six) hours as needed for moderate pain.     warfarin 7.5 MG tablet  Commonly known as:  COUMADIN  Take 1 tablet (7.5 mg total) by mouth one time only at 6 PM.           Follow-up Information   Follow up with Gearlean Alf, MD. Schedule an appointment as soon as possible for a visit in 10 days.   Specialty:  Orthopedic Surgery   Contact information:   [address] [place] [address] [phone]       Signed: Ardeen Jourdain, PA-C Orthopaedic Surgery 02/19/2014, 7:18 AM

## 2014-02-19 NOTE — Progress Notes (Signed)
Physical Therapy Treatment Patient Details Name: James Atkinson MRN: [id number] DOB: 1946/01/24 Today's Date: 02/19/2014    History of Present Illness Bil TKR    PT Comments    Pt states will be leaving for CIR this pm  Follow Up Recommendations  CIR     Equipment Recommendations  None recommended by PT    Recommendations for Other Services OT consult     Precautions / Restrictions Precautions Precautions: Knee;Fall Required Braces or Orthoses: Knee Immobilizer - Right;Knee Immobilizer - Left Knee Immobilizer - Right: Discontinue once straight leg raise with < 10 degree lag Knee Immobilizer - Left: Discontinue once straight leg raise with < 10 degree lag Restrictions Weight Bearing Restrictions: No Other Position/Activity Restrictions: WBAT    Mobility  Bed Mobility Overal bed mobility: Needs Assistance Bed Mobility: Sit to Supine     Supine to sit: Min assist Sit to supine: Mod assist   General bed mobility comments: cues for sequence with physical assist for LE management  Transfers Overall transfer level: Needs assistance Equipment used: Rolling walker (2 wheeled) Transfers: Sit to/from Stand Sit to Stand: Mod assist;+2 physical assistance         General transfer comment: cues for LE management and use of UEs to self assist  Ambulation/Gait Ambulation/Gait assistance: Min assist;Mod assist Ambulation Distance (Feet): 16 Feet Assistive device: Rolling walker (2 wheeled) Gait Pattern/deviations: Step-to pattern;Decreased step length - right;Decreased step length - left;Shuffle;Trunk flexed Gait velocity: decr   General Gait Details: cues for posture, sequence, wt shift and position from [employer]    Modified Rankin (Stroke Patients Only)       Balance                                    Cognition Arousal/Alertness: Awake/alert Behavior During Therapy: WFL for tasks  assessed/performed Overall Cognitive Status: Within Functional Limits for tasks assessed                      Exercises Total Joint Exercises Ankle Circles/Pumps: AROM;Both;15 reps;Supine Quad Sets: Both;AAROM;Supine;20 reps Heel Slides: AAROM;Both;Supine;20 reps Hip ABduction/ADduction: AAROM;Both;10 reps;Supine Straight Leg Raises: AAROM;Both;20 reps;Supine Goniometric ROM: AAROM R knee - 10 - 60; L -10- 65    General Comments        Pertinent Vitals/Pain Pain Assessment: 0-10 Pain Score: 3  Pain Location: Bil knees Pain Descriptors / Indicators: Aching Pain Intervention(s): Limited activity within patient's tolerance;Monitored during session;Premedicated before session;Ice applied    Home Living                      Prior Function            PT Goals (current goals can now be found in the care plan section) Acute Rehab PT Goals Patient Stated Goal: walk without pain PT Goal Formulation: With patient Time For Goal Achievement: 02/22/14 Potential to Achieve Goals: Good Progress towards PT goals: Progressing toward goals    Frequency  7X/week    PT Plan Current plan remains appropriate    Co-evaluation             End of Session Equipment Utilized During Treatment: Right knee immobilizer;Left knee immobilizer;Gait belt Activity Tolerance: Patient tolerated treatment well Patient left: in bed;with call bell/phone within reach     Time:  1031-5945 PT Time Calculation (min): 13 min  Charges:  $Gait Training: 8-22 mins $Therapeutic Exercise: 23-37 mins                    G Codes:      James Atkinson 2014-02-25, 1:04 PM

## 2014-02-19 NOTE — Progress Notes (Signed)
   Subjective: 5 Days Post-Op Procedure(s) (LRB): BILATERAL TOTAL KNEE ARTHROPLASTY (Bilateral) Patient reports pain as moderate.   Patient seen in rounds without Dr. [name]. Patient is well, and has had no acute complaints or problems. He reports that he feels that he is progressing but still does not feel comfortable getting up by himself. No issues overnight. No SOB or chest pain. Voiding well. Had BM yesterday.  Plan is to go Rehab after hospital stay.  Objective: Vital signs in last 24 hours: Temp:  [98.5 F (36.9 C)-98.9 F (37.2 C)] 98.5 F (36.9 C) (08/17 0600) Pulse Rate:  [67-87] 67 (08/17 0600) Resp:  [16-20] 16 (08/17 0600) BP: (99-126)/(58-60) 99/59 mmHg (08/17 0600) SpO2:  [96 %-100 %] 100 % (08/17 0600)  Intake/Output from previous day:  Intake/Output Summary (Last 24 hours) at 02/19/14 0716 Last data filed at 02/19/14 0651  Gross per 24 hour  Intake 1024.5 ml  Output   1600 ml  Net -575.5 ml     Labs:  Recent Labs  02/17/14 0540 02/18/14 0556  HGB 8.3* 7.7*    Recent Labs  02/17/14 0540 02/18/14 0556  WBC 7.8 5.6  RBC 2.68* 2.48*  HCT 24.0* 22.4*  PLT 120* 111*    Recent Labs  02/17/14 0540  NA 137  K 4.1  CL 101  CO2 27  BUN 17  CREATININE 0.85  GLUCOSE 117*  CALCIUM 8.4    Recent Labs  02/18/14 0556 02/19/14 0435  INR 1.42 1.46    EXAM General - Patient is Alert and Oriented Extremity - Neurologically intact Intact pulses distally Dorsiflexion/Plantar flexion intact Compartment soft Dressing/Incision - clean, dry, no drainage Motor Function - intact, moving foot and toes well on exam.   Past Medical History  Diagnosis Date  . Allergy   . Colon polyp   . Hypercholesteremia     diet controlled  . Hypertension   . GERD (gastroesophageal reflux disease)   . History of shingles   . Hemorrhoids     hx of  . Pneumonia 2012    hx of  . H/O measles     as child  . H/O mumps     as child  . Arthritis     thumbs,  knees, back  . Complication of anesthesia     "hard to wake up after colonoscopy"  . Ringing in ears     Assessment/Plan: 5 Days Post-Op Procedure(s) (LRB): BILATERAL TOTAL KNEE ARTHROPLASTY (Bilateral) Principal Problem:   OA (osteoarthritis) of knee Active Problems:   Postoperative anemia due to acute blood loss  Estimated body mass index is 25.79 kg/(m^2) as calculated from the following:   Height as of this encounter: 5\' 5"  (1.651 m).   Weight as of this encounter: 70.308 kg (155 lb). Advance diet Up with therapy D/C IV fluids Discharge to CIR   DVT Prophylaxis - Lovenox and Coumadin Weight-Bearing as tolerated   He is progressing. Will DC to CIR today. No transfusion needed as patient is asymptomatic. Will treat with iron supplementation. Continue coumadin and lovenox until INR 2-3.   Ardeen Jourdain, PA-C Orthopaedic Surgery 02/19/2014, 7:16 AM

## 2014-02-19 NOTE — Progress Notes (Signed)
Orthopedic Tech Progress Note Patient Details:  James Atkinson March 02, 1946 [id number]  CPM Left Knee CPM Left Knee: On Left Knee Flexion (Degrees): 50 Left Knee Extension (Degrees): 0 CPM Right Knee CPM Right Knee: On Right Knee Flexion (Degrees): 50 Right Knee Extension (Degrees): 0   Braulio Bosch 02/19/2014, 7:37 PM

## 2014-02-19 NOTE — H&P (Signed)
Physical Medicine and Rehabilitation Admission H&P  CC: Bilateral Knee DJD  HPI: James Atkinson is a 68 y.o. male with history of HTN, GERD, dysphagia s/p dilatation, DJD/DDD with endstage changes bilateral knees and failure of conservative therapy. He elected to undergo B-TKR on 02/14/14 by Dr. Chanetta Marshall. Post op is WBAT and on coumadin for DVT prophylaxis. Has epidural for pain control but developed hypotension last pm requiring fluid resuscitation. ABLA being monitored and blood pressures improving. Post op with constipation resolving. Therapy ongoing and CIR recommended by MD as well as rehab team. Patient admitted today for comprehensive inpatient rehab program.  Review of Systems  Gastrointestinal: Positive for constipation.  Musculoskeletal: Positive for joint pain.   Past Medical History   Diagnosis  Date   .  Allergy    .  Colon polyp    .  Hypercholesteremia      diet controlled   .  Hypertension    .  GERD (gastroesophageal reflux disease)    .  History of shingles    .  Hemorrhoids      hx of   .  Pneumonia  2012     hx of   .  H/O measles      as child   .  H/O mumps      as child   .  Arthritis      thumbs, knees, back   .  Complication of anesthesia      "hard to wake up after colonoscopy"   .  Ringing in ears     Past Surgical History   Procedure  Laterality  Date   .  Rotator cuff repair  Left  7 years ago   .  Knee arthroscopy  Left  23 years ago   .  Colonoscopy       x2   .  Esophageal dilation     .  Hemorrhoid banding       x8   .  Nasal septum surgery   25 years ago   .  Tonsillectomy   2nd grade   .  Hernia repair  Bilateral  2012   .  Total knee arthroplasty  Bilateral  02/14/2014     Procedure: BILATERAL TOTAL KNEE ARTHROPLASTY; Surgeon: Gearlean Alf, MD; Location: [facility]; Service: Orthopedics; Laterality: Bilateral;    Family History   Problem  Relation  Age of Onset   .  COPD  Father    .  Liver cancer  Mother    .  Lung cancer  Paternal  Grandfather     Social History: Married. Independent and working full time PTA. He reports that he quit smoking about 25 years ago. His smoking use included Cigarettes. He has a 10 pack-year smoking history. He quit smokeless tobacco use about 25 years ago. His smokeless tobacco use included Chew. He reports that he does not drink alcohol or use illicit drugs.  Allergies   Allergen  Reactions   .  Ciprofloxacin  Nausea Only    No prescriptions prior to admission    Home:  Home Living  Family/patient expects to be discharged to:: Inpatient rehab  Living Arrangements: Spouse/significant other  Available Help at Discharge: Available 24 hours/day;Other (Comment) (wife can take off one week after d/c)  Type of Home: House  Home Access: Stairs to enter  [employer] of Steps: 1 to 2  Home Layout: One level  Lives With: Spouse  Functional History:  Prior Function  Level of Independence: Independent  Functional Status:  Mobility:  Bed Mobility  Overal bed mobility: Needs Assistance  Bed Mobility: Sit to Supine  Supine to sit: Min assist  Sit to supine: Mod assist  General bed mobility comments: cues for sequence with physical assist for LE management  Transfers  Overall transfer level: Needs assistance  Equipment used: Rolling walker (2 wheeled)  Transfers: Sit to/from Stand  Sit to Stand: Mod assist;+2 physical assistance  General transfer comment: cues for LE management and use of UEs to self assist  Ambulation/Gait  Ambulation/Gait assistance: Min assist;Mod assist  Ambulation Distance (Feet): 16 Feet  Assistive device: Rolling walker (2 wheeled)  Gait Pattern/deviations: Step-to pattern;Decreased step length - right;Decreased step length - left;Shuffle;Trunk flexed  Gait velocity: decr  General Gait Details: cues for posture, sequence, wt shift and position from RW   ADL:  ADL  Overall ADL's : Needs assistance/impaired  Eating/Feeding: Independent;Sitting    Grooming: Wash/dry face;Set up;Sitting  Upper Body Bathing: Set up;Supervision/ safety;Sitting  Lower Body Bathing: +2 for physical assistance;Maximal assistance;Sit to/from stand  Upper Body Dressing : Minimal assistance;Sitting  Upper Body Dressing Details (indicate cue type and reason): with gown  Lower Body Dressing: +2 for physical assistance;Total assistance;Sit to/from stand  Lower Body Dressing Details (indicate cue type and reason): not able to reach to bilateral foot; unable to let go of walker in standing to manage clothing. Requiring walker for UE support to stand.  Toilet Transfer: +2 for physical assistance;Moderate assistance;RW  Toilet Transfer Details (indicate cue type and reason): took a few steps away from EOB and then became nauseous and had to sit.  Toileting- Clothing Manipulation and Hygiene: +2 for physical assistance;Total assistance;Sit to/from stand  General ADL Comments: Educated pt and wife on AE options at the end of the session once pt feeling better. He had become nauseous after taking a few steps with walker and needed to sit. Pt is very motivated to participate but limited by the nausea and also a drop in BP with activity.  Cognition:  Cognition  Overall Cognitive Status: Within Functional Limits for tasks assessed  Orientation Level: Oriented to person;Oriented to place;Disoriented to time  Cognition  Arousal/Alertness: Awake/alert  Behavior During Therapy: WFL for tasks assessed/performed  Overall Cognitive Status: Within Functional Limits for tasks assessed  Physical Exam:  Blood pressure 109/54, pulse 75, temperature 98.8 F (37.1 C), temperature source Oral, resp. rate 16, height 5\' 5"  (1.651 m), weight 70.308 kg (155 lb), SpO2 100.00%.  Constitutional: He is oriented to person, place, and time. He appears well-developed and well-nourished.  HENT: oral mucosa pink and moist Head: Normocephalic and atraumatic.  Right Ear: External ear normal.  Left  Ear: External ear normal.  Eyes: Conjunctivae and EOM are normal. Pupils are equal, round, and reactive to light.  Neck: Normal range of motion. Neck supple.  Cardiovascular: Normal rate, regular rhythm and normal heart sounds.  No murmurs or rubs Respiratory: Effort normal and breath sounds normal. No wheezes or rales GI: Soft. He exhibits distension.  Musculoskeletal:  Bilateral Knee with edema, PROM to about 60-70 degrees of flexion  FROM at ankles And BUEs  Neurological: He is alert and oriented to person, place, and time. UES 5/5. LE: HF 3/5, KE 1/5, ADF/APF 4+. Normal sensory exam. CN exam normal.  Skin: Skin is warm and dry. Wounds are dry/intact/well-approximated Psychiatric: He has a normal mood and affect    Results for orders placed during the hospital encounter of 02/14/14 (  from the past 48 hour(s))   CBC Status: Abnormal    Collection Time    02/18/14 5:56 AM   Result  Value  Ref Range    WBC  5.6  4.0 - 10.5 K/uL    RBC  2.48 (*)  4.22 - 5.81 MIL/uL    Hemoglobin  7.7 (*)  13.0 - 17.0 g/dL    HCT  22.4 (*)  39.0 - 52.0 %    MCV  90.3  78.0 - 100.0 fL    MCH  31.0  26.0 - 34.0 pg    MCHC  34.4  30.0 - 36.0 g/dL    RDW  12.3  11.5 - 15.5 %    Platelets  111 (*)  150 - 400 K/uL    Comment:  CONSISTENT WITH PREVIOUS RESULT   PROTIME-INR Status: Abnormal    Collection Time    02/18/14 5:56 AM   Result  Value  Ref Range    Prothrombin Time  17.4 (*)  11.6 - 15.2 seconds    INR  1.42  0.00 - 1.49   PROTIME-INR Status: Abnormal    Collection Time    02/19/14 4:35 AM   Result  Value  Ref Range    Prothrombin Time  17.7 (*)  11.6 - 15.2 seconds    INR  1.46  0.00 - 1.49    Comment:  Performed at [facility]    No results found.  Medical Problem List and Plan:  1. Functional deficits secondary to Bilateral Knee DJD.  2. DVT Prophylaxis/Anticoagulation: Pharmaceutical: Coumadin and Lovenox till INR therapeutic  3. Pain Management: Will continue oxycodone prn.  Ice in between therapy sessions.  4. Mood: LCSW to follow for evaluation and support.  5. Neuropsych: This patient is capable of making decisions on his own behalf.  6. Skin/Wound Care: Monitor wound daily. Will add nutritional supplement for wound healing.  7. HTN: Will monitor BP every 8 hours. Hold lisinopril for SBP less than 120. Push po fluids.  8. ABLA: Continue iron supplement.  9. Constipation: Will schedule miralax bid and titrate as bowels function start becoming regular.  Post Admission Physician Evaluation:  1. Functional deficits secondary to bilateral knee DJD s/p double knee replacement.  2. Patient is admitted to receive collaborative, interdisciplinary care between the physiatrist, rehab nursing staff, and therapy team. 3. Patient's level of medical complexity and substantial therapy needs in context of that medical necessity cannot be provided at a lesser intensity of care such as a SNF. 4. Patient has experienced substantial functional loss from his/her baseline which was documented above under the "Functional History" and "Functional Status" headings. Judging by the patient's diagnosis, physical exam, and functional history, the patient has potential for functional progress which will result in measurable gains while on inpatient rehab. These gains will be of substantial and practical use upon discharge in facilitating mobility and self-care at the household level. 5. Physiatrist will provide 24 hour management of medical needs as well as oversight of the therapy plan/treatment and provide guidance as appropriate regarding the interaction of the two. 6. 24 hour rehab nursing will assist with bladder management, bowel management, safety, skin/wound care, disease management, medication administration, pain management and patient education and help integrate therapy concepts, techniques,education, etc. 7. PT will assess and treat for/with: Lower extremity strength, range of motion,  stamina, balance, functional mobility, safety, adaptive techniques and equipment, pain mgt, knee rom. Goals are: mod I. 8. OT will assess and  treat for/with: ADL's, functional mobility, safety, upper extremity strength, adaptive techniques and equipment, pain mgt. Goals are: mod I. 9. SLP will assess and treat for/with: n/a. Goals are: n/a. 10. Case Management and Social Worker will assess and treat for psychological issues and discharge planning. 11. Team conference will be held weekly to assess progress toward goals and to determine barriers to discharge. 12. Patient will receive at least 3 hours of therapy per day at least 5 days per week. 13. ELOS: 7-10 days  14. Prognosis: excellent  Meredith Staggers, MD, [facility]   02/19/2014

## 2014-02-19 NOTE — Discharge Instructions (Signed)
Walk with your walker Weight bearing as tolerated. Change your dressing daily as needed Shower only, no tub bath. Call if any temperatures greater than 101 or any wound complications: [phone]

## 2014-02-19 NOTE — Progress Notes (Signed)
Physical Therapy Treatment Patient Details Name: James Atkinson MRN: [id number] DOB: 09/18/1945 Today's Date: 02/19/2014    History of Present Illness Bil TKR    PT Comments    Marked improvement in ability to self assist with bed mobility and stability with ambulation  Follow Up Recommendations  CIR     Equipment Recommendations  None recommended by PT    Recommendations for Other Services OT consult     Precautions / Restrictions Precautions Precautions: Knee;Fall Required Braces or Orthoses: Knee Immobilizer - Right;Knee Immobilizer - Left Knee Immobilizer - Right: Discontinue once straight leg raise with < 10 degree lag Knee Immobilizer - Left: Discontinue once straight leg raise with < 10 degree lag Restrictions Weight Bearing Restrictions: No Other Position/Activity Restrictions: WBAT    Mobility  Bed Mobility Overal bed mobility: Needs Assistance Bed Mobility: Supine to Sit     Supine to sit: Min assist     General bed mobility comments: cues for sequence with physical assist for LE management  Transfers Overall transfer level: Needs assistance Equipment used: Rolling walker (2 wheeled) Transfers: Sit to/from Stand Sit to Stand: From elevated surface;Min assist;Mod assist;+2 physical assistance         General transfer comment: cues for LE management and use of UEs to self assist  Ambulation/Gait Ambulation/Gait assistance: Min assist;Mod assist Ambulation Distance (Feet): 69 Feet Assistive device: Rolling walker (2 wheeled) Gait Pattern/deviations: Step-to pattern;Decreased step length - right;Decreased step length - left;Shuffle;Trunk flexed Gait velocity: decr   General Gait Details: cues for posture, sequence, wt shift and position from [employer] Mobility    Modified Rankin (Stroke Patients Only)       Balance                                    Cognition Arousal/Alertness:  Awake/alert Behavior During Therapy: WFL for tasks assessed/performed Overall Cognitive Status: Within Functional Limits for tasks assessed                      Exercises Total Joint Exercises Ankle Circles/Pumps: AROM;Both;15 reps;Supine Quad Sets: Both;AAROM;Supine;20 reps Heel Slides: AAROM;Both;Supine;20 reps Hip ABduction/ADduction: AAROM;Both;10 reps;Supine Straight Leg Raises: AAROM;Both;20 reps;Supine Goniometric ROM: AAROM R knee - 10 - 60; L -10- 65    General Comments        Pertinent Vitals/Pain Pain Assessment: 0-10 Pain Score: 3  Pain Location: Bil knees Pain Descriptors / Indicators: Aching Pain Intervention(s): Limited activity within patient's tolerance;Monitored during session;Premedicated before session;Ice applied    Home Living                      Prior Function            PT Goals (current goals can now be found in the care plan section) Acute Rehab PT Goals Patient Stated Goal: walk without pain PT Goal Formulation: With patient Time For Goal Achievement: 02/22/14 Potential to Achieve Goals: Good Progress towards PT goals: Progressing toward goals    Frequency  7X/week    PT Plan Current plan remains appropriate    Co-evaluation             End of Session Equipment Utilized During Treatment: Right knee immobilizer;Left knee immobilizer;Gait belt Activity Tolerance: Patient tolerated treatment well Patient left: in chair;with call bell/phone within reach;with  family/visitor present     Time: 0942-1020 PT Time Calculation (min): 38 min  Charges:  $Gait Training: 8-22 mins $Therapeutic Exercise: 23-37 mins                    G Codes:      James Atkinson Feb 25, 2014, 1:00 PM

## 2014-02-19 NOTE — Progress Notes (Signed)
Rehab admissions - We have approval from insurance carrier for acute inpatient rehab admission.  I spoke to patient and he is agreeable.  I spoke with RN and patient is medically stable.  Bed available and will admit to acute inpatient rehab today.  Call me for questions.  #[phone]

## 2014-02-19 NOTE — Progress Notes (Signed)
ANTICOAGULATION CONSULT NOTE - Follow Up Consult  Pharmacy Consult for Warfarin Indication: VTE prophylaxis  Allergies  Allergen Reactions  . Ciprofloxacin Nausea Only    Patient Measurements: Height: 5\' 5"  (165.1 cm) Weight: 155 lb (70.308 kg) IBW/kg (Calculated) : 61.5  Vital Signs: Temp: 98.5 F (36.9 C) (08/17 0600) Temp src: Oral (08/17 0600) BP: 112/62 mmHg (08/17 0905) Pulse Rate: 67 (08/17 0600)  Labs:  Recent Labs  02/17/14 0540 02/18/14 0556 02/19/14 0435  HGB 8.3* 7.7*  --   HCT 24.0* 22.4*  --   PLT 120* 111*  --   LABPROT 14.9 17.4* 17.7*  INR 1.17 1.42 1.46  CREATININE 0.85  --   --     Estimated Creatinine Clearance: 74.4 ml/min (by C-G formula based on Cr of 0.85).  Assessment: 67yoM s/p bilateral TKA, currently on ropivacaine epidural. Warfarin per Rx for VTE prophylaxis to begin tonight. Ortho planning on starting lovenox 12 hours after epidural has been pulled on 8/14.  INR 1.46 today - trending up  ABLA, Hgb down to 7.7 8/16 (12.7 pre-op)  Plts baseline low, 111 8/16 (144 pre-op)  No bleeding reported  No drug-drug interactions currently noted, however, would recommend holding PTA fish oil supplement and ibuprofen until warfarin therapy complete.  On regular diet but appears intake down last 24h  Epidural removed, tip intact, 8/14 at 1505  Warfarin education provided on 8/14  On enoxaparin 30mg  SQ q12h  Goal of Therapy:  INR 2-3 Monitor platelets by anticoagulation protocol: Yes   Plan:   Warfarin dose of 7.5mg  was not given as scheduled yesterday. No reason documented.  Warfarin 7.5 mg PO x 1 today at 18:00 as INR currently trending up nicely toward therapeutic range  Daily PT/INR  Plan is for pt to go to rehab facility where he will continue lovenox until INR between 2-3  Kizzie Furnish, PharmD Pager: [phone] 02/19/2014 1:47 PM

## 2014-02-19 NOTE — Progress Notes (Signed)
Patient ID: James Atkinson, male   DOB: Nov 29, 1945, 68 y.o.   MRN: [id number] Patient admitted to [facility] via stretcher, escorted by carelink.  Patient verbalized understanding of rehab process, no additional questions asked.  Paged Algis Liming, PA for orders.  VSS.  Appears to be in no immediate distress at this time.  Will continue to monitor.  Brita Romp, RN

## 2014-02-20 ENCOUNTER — Inpatient Hospital Stay (HOSPITAL_COMMUNITY): Payer: BC Managed Care – PPO

## 2014-02-20 ENCOUNTER — Inpatient Hospital Stay (HOSPITAL_COMMUNITY): Payer: BC Managed Care – PPO | Admitting: Occupational Therapy

## 2014-02-20 ENCOUNTER — Inpatient Hospital Stay (HOSPITAL_COMMUNITY): Payer: BC Managed Care – PPO | Admitting: Physical Therapy

## 2014-02-20 LAB — CBC WITH DIFFERENTIAL/PLATELET
BASOS ABS: 0 10*3/uL (ref 0.0–0.1)
Basophils Relative: 0 % (ref 0–1)
EOS PCT: 5 % (ref 0–5)
Eosinophils Absolute: 0.2 10*3/uL (ref 0.0–0.7)
HEMATOCRIT: 23 % — AB (ref 39.0–52.0)
Hemoglobin: 8.1 g/dL — ABNORMAL LOW (ref 13.0–17.0)
LYMPHS ABS: 1 10*3/uL (ref 0.7–4.0)
LYMPHS PCT: 20 % (ref 12–46)
MCH: 31.4 pg (ref 26.0–34.0)
MCHC: 35.2 g/dL (ref 30.0–36.0)
MCV: 89.1 fL (ref 78.0–100.0)
Monocytes Absolute: 0.6 10*3/uL (ref 0.1–1.0)
Monocytes Relative: 13 % — ABNORMAL HIGH (ref 3–12)
Neutro Abs: 3.2 10*3/uL (ref 1.7–7.7)
Neutrophils Relative %: 62 % (ref 43–77)
PLATELETS: 188 10*3/uL (ref 150–400)
RBC: 2.58 MIL/uL — AB (ref 4.22–5.81)
RDW: 12.3 % (ref 11.5–15.5)
WBC: 5.1 10*3/uL (ref 4.0–10.5)

## 2014-02-20 LAB — COMPREHENSIVE METABOLIC PANEL
ALBUMIN: 2.5 g/dL — AB (ref 3.5–5.2)
ALT: 17 U/L (ref 0–53)
AST: 21 U/L (ref 0–37)
Alkaline Phosphatase: 55 U/L (ref 39–117)
Anion gap: 12 (ref 5–15)
BUN: 20 mg/dL (ref 6–23)
CALCIUM: 8.8 mg/dL (ref 8.4–10.5)
CO2: 25 mEq/L (ref 19–32)
Chloride: 100 mEq/L (ref 96–112)
Creatinine, Ser: 0.81 mg/dL (ref 0.50–1.35)
GFR calc Af Amer: >90 mL/min (ref >90)
Glucose, Bld: 111 mg/dL — ABNORMAL HIGH (ref 70–99)
Potassium: 4.4 mEq/L (ref 3.7–5.3)
Sodium: 137 mEq/L (ref 137–147)
Total Bilirubin: 0.6 mg/dL (ref 0.3–1.2)
Total Protein: 5.6 g/dL — ABNORMAL LOW (ref 6.0–8.3)

## 2014-02-20 LAB — PROTIME-INR
INR: 1.64 — AB (ref 0.00–1.49)
Prothrombin Time: 19.4 seconds — ABNORMAL HIGH (ref 11.6–15.2)

## 2014-02-20 MED ORDER — WARFARIN SODIUM 5 MG PO TABS
5.0000 mg | ORAL_TABLET | Freq: Once | ORAL | Status: AC
  Administered 2014-02-20: 5 mg via ORAL
  Filled 2014-02-20 (×2): qty 1

## 2014-02-20 MED ORDER — FLEET ENEMA 7-19 GM/118ML RE ENEM: 1.0000 | ENEMA | Freq: Once | RECTAL | Status: DC

## 2014-02-20 NOTE — Evaluation (Signed)
Occupational Therapy Assessment and Plan  Patient Details  Name: James Atkinson MRN: [id number] Date of Birth: 1946-01-25  OT Diagnosis: acute pain and muscle weakness (generalized) Rehab Potential: Rehab Potential: Good ELOS: 5-7 days   Today's Date: 02/20/2014 OT Individual Time: 1010-1110 OT Individual Time Calculation (min): 60 min    Problem List:  Patient Active Problem List   Diagnosis Date Noted  . Postoperative anemia due to acute blood loss 02/16/2014  . OA (osteoarthritis) of knee 02/14/2014  . Esophageal dysphagia 01/03/2014    Past Medical History:  Past Medical History  Diagnosis Date  . Allergy   . Colon polyp   . Hypercholesteremia     diet controlled  . Hypertension   . GERD (gastroesophageal reflux disease)   . History of shingles   . Hemorrhoids     hx of  . Pneumonia 2012    hx of  . H/O measles     as child  . H/O mumps     as child  . Arthritis     thumbs, knees, back  . Complication of anesthesia     "hard to wake up after colonoscopy"  . Ringing in ears    Past Surgical History:  Past Surgical History  Procedure Laterality Date  . Rotator cuff repair Left 7 years ago  . Knee arthroscopy Left 23 years ago  . Colonoscopy      x2  . Esophageal dilation    . Hemorrhoid banding      x8   . Nasal septum surgery  25 years ago  . Tonsillectomy  2nd grade  . Hernia repair Bilateral 2012  . Total knee arthroplasty Bilateral 02/14/2014    Procedure: BILATERAL TOTAL KNEE ARTHROPLASTY;  Surgeon: Gearlean Alf, MD;  Location: [facility];  Service: Orthopedics;  Laterality: Bilateral;    Assessment & Plan Clinical Impression: James Atkinson is a 68 y.o. male with history of HTN, GERD, dysphagia s/p dilatation, DJD/DDD with endstage changes bilateral knees and failure of conservative therapy. He elected to undergo B-TKR on 02/14/14 by Dr. Chanetta Marshall. Post op is WBAT and on coumadin for DVT prophylaxis. Has epidural for pain control but developed  hypotension last pm requiring fluid resuscitation. ABLA being monitored and blood pressures improving. Post op with constipation resolving. Therapy ongoing and CIR recommended by MD as well as rehab team. Patient admitted today for comprehensive inpatient rehab program.  Patient transferred to CIR on 02/19/2014 .    Patient currently requires min with basic self-care skills secondary to muscle weakness, decreased cardiorespiratoy endurance and decreased standing balance.  Prior to hospitalization, was independent with self care.  Patient will benefit from skilled intervention to decrease level of assist with basic self-care skills prior to discharge home with care partner.  Anticipate patient will require intermittent supervision and no further OT follow recommended.  OT - End of Session Activity Tolerance: Tolerates 10 - 20 min activity with multiple rests OT Assessment Rehab Potential: Good OT Patient demonstrates impairments in the following area(s): Balance;Endurance;Pain OT Basic ADL's Functional Problem(s): Bathing;Dressing;Toileting OT Advanced ADL's Functional Problem(s): Simple Meal Preparation OT Transfers Functional Problem(s): Toilet;Tub/Shower OT Additional Impairment(s): None OT Plan OT Intensity: Minimum of 1-2 x/day, 45 to 90 minutes OT Frequency: 5 out of 7 days OT Duration/Estimated Length of Stay: 5-7 days OT Treatment/Interventions: Balance/vestibular training;Discharge planning;DME/adaptive equipment instruction;Functional mobility training;Patient/family education;Self Care/advanced ADL retraining;Therapeutic Activities;Therapeutic Exercise;UE/LE Strength taining/ROM;UE/LE Coordination activities OT Self Feeding Anticipated Outcome(s): I OT Basic Self-Care Anticipated Outcome(s):  mod I OT Toileting Anticipated Outcome(s): mod I OT Bathroom Transfers Anticipated Outcome(s): mod I OT Recommendation Patient destination: Home Follow Up Recommendations: None Equipment  Details: pt has tub bench   Skilled Therapeutic Intervention Pt seen for initial evaluation and self care training of bathing and dressing with a focus on activity tolerance. Pt was excited to get up and take a shower. KI donned and pt ambulated to toilet, but then he felt nauseated.Pt had to sit for several minutes to relax. He decided shower would be too much so ambulated to recliner. KI doffed and pt completed all self care from recliner leaning side to side to don clothing over hips and to wash bottom. Pt has good strength but participation limited by feelings of nausea. Pt resting in recliner with call light in reach.  OT Evaluation Precautions/Restrictions  Precautions Precautions: Knee;Fall Required Braces or Orthoses: Knee Immobilizer - Right;Knee Immobilizer - Left Knee Immobilizer - Right: Discontinue once straight leg raise with < 10 degree lag Knee Immobilizer - Left: Discontinue once straight leg raise with < 10 degree lag Restrictions Weight Bearing Restrictions: Yes RLE Weight Bearing: Weight bearing as tolerated LLE Weight Bearing: Weight bearing as tolerated    Vital Signs Therapy Vitals Pulse Rate: 70 BP: 115/64 mmHg Pain Pain Assessment Pain Assessment: 0-10 Pain Score: 7  Pain Type: Surgical pain Pain Location: Knee Pain Orientation: Right;Left Pain Descriptors / Indicators: Aching Pain Frequency: Intermittent Pain Onset: With Activity Patients Stated Pain Goal: 2 Pain Intervention(s): Medication (See eMAR) Home Living/Prior [place] expects to be discharged to:: Private residence Living Arrangements: Spouse/significant other Available Help at Discharge: Available 24 hours/day;Other (Comment) Type of Home: House Home Access: Stairs to enter Entrance Stairs-Number of Steps: 1 to 2 Home Layout: One level Additional Comments: Pt's wife purchased tub bench  Lives With: Spouse Prior Function Level of Independence: Independent  with basic ADLs;Independent with homemaking with ambulation  Able to Take Stairs?: Yes Driving: Yes Vocation: Full time employment 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 ADL ADL ADL Comments: refer to [place]- History Baseline Vision/History: No visual deficits Patient Visual Report: No change from baseline Vision- Assessment Additional Comments: WFL Perception Comments: WFL  Cognition Overall Cognitive Status: Within Functional Limits for tasks assessed Orientation Level: Oriented X4 Sensation Sensation Light Touch: Appears Intact Stereognosis: Appears Intact Hot/Cold: Appears Intact Proprioception: Appears Intact Coordination Gross Motor Movements are Fluid and Coordinated: Yes Fine Motor Movements are Fluid and Coordinated: Yes Motor  Motor Motor: Within Functional Limits Mobility    min A Trunk/Postural Assessment  Cervical Assessment Cervical Assessment: Within Functional Limits Thoracic Assessment Thoracic Assessment: Within Functional Limits Lumbar Assessment Lumbar Assessment: Within Functional Limits Postural Control Postural Control: Within Functional Limits  Balance Dynamic Standing Balance Dynamic Standing - Level of Assistance: 4: Min assist Extremity/Trunk Assessment RUE Assessment RUE Assessment: Within Functional Limits LUE Assessment LUE Assessment: Within Functional Limits  FIM:  FIM - Grooming Grooming Steps: Wash, rinse, dry face;Wash, rinse, dry hands;Oral care, brush teeth, clean dentures Grooming: 7:Complete independence: no helper FIM - Bathing Bathing Steps Patient Completed: Chest;Right Arm;Left Arm;Abdomen;Left upper leg;Right upper leg;Buttocks;Left lower leg (including foot);Right lower leg (including foot);Front perineal area Bathing: 5: Supervision: Safety issues/verbal cues FIM - Upper Body Dressing/Undressing Upper body dressing/undressing steps patient completed: Thread/unthread  right sleeve of pullover shirt/dresss;Thread/unthread left sleeve of pullover shirt/dress;Put head through opening of pull over shirt/dress;Pull shirt over trunk Upper body dressing/undressing: 5: Set-up assist to: Obtain clothing/put away FIM -  Lower Body Dressing/Undressing Lower body dressing/undressing steps patient completed: Thread/unthread right underwear leg;Thread/unthread left underwear leg;Pull underwear up/down;Thread/unthread right pants leg;Thread/unthread left pants leg;Pull pants up/down Lower body dressing/undressing: 4: Min-Patient completed 75 plus % of tasks FIM - Control and instrumentation engineer Devices: Copy: 4: Supine > Sit: Min A (steadying Pt. > 75%/lift 1 leg);4: Bed > Chair or W/C: Min A (steadying Pt. > 75%) FIM - Radio producer Devices: Grab bars;Walker;Bedside commode (bsc over toilet) Toilet Transfers: 4-To toilet/BSC: Min A (steadying Pt. > 75%);4-From toilet/BSC: Min A (steadying Pt. > 75%) FIM - Tub/Shower Transfers Tub/shower Transfers: 0-Activity did not occur or was simulated   Refer to Care Plan for Long Term Goals  Recommendations for other services: None  Discharge Criteria: Patient will be discharged from OT if patient refuses treatment 3 consecutive times without medical reason, if treatment goals not met, if there is a change in medical status, if patient makes no progress towards goals or if patient is discharged from hospital.  The above assessment, treatment plan, treatment alternatives and goals were discussed and mutually agreed upon: by patient  [facility] 02/20/2014, 12:11 PM

## 2014-02-20 NOTE — Progress Notes (Signed)
Social Work Assessment and Plan Social Work Assessment and Plan  Patient Details  Name: James Atkinson MRN: [id number] Date of Birth: 1945/12/07  Today's Date: 02/20/2014  Problem List:  Patient Active Problem List   Diagnosis Date Noted  . Postoperative anemia due to acute blood loss 02/16/2014  . OA (osteoarthritis) of knee 02/14/2014  . Esophageal dysphagia 01/03/2014   Past Medical History:  Past Medical History  Diagnosis Date  . Allergy   . Colon polyp   . Hypercholesteremia     diet controlled  . Hypertension   . GERD (gastroesophageal reflux disease)   . History of shingles   . Hemorrhoids     hx of  . Pneumonia 2012    hx of  . H/O measles     as child  . H/O mumps     as child  . Arthritis     thumbs, knees, back  . Complication of anesthesia     "hard to wake up after colonoscopy"  . Ringing in ears    Past Surgical History:  Past Surgical History  Procedure Laterality Date  . Rotator cuff repair Left 7 years ago  . Knee arthroscopy Left 23 years ago  . Colonoscopy      x2  . Esophageal dilation    . Hemorrhoid banding      x8   . Nasal septum surgery  25 years ago  . Tonsillectomy  2nd grade  . Hernia repair Bilateral 2012  . Total knee arthroplasty Bilateral 02/14/2014    Procedure: BILATERAL TOTAL KNEE ARTHROPLASTY;  Surgeon: Gearlean Alf, MD;  Location: [facility];  Service: Orthopedics;  Laterality: Bilateral;   Social History:  reports that he quit smoking about 25 years ago. His smoking use included Cigarettes. He has a 10 pack-year smoking history. He quit smokeless tobacco use about 25 years ago. His smokeless tobacco use included Chew. He reports that he does not drink alcohol or use illicit drugs.  Family / Support Systems Marital Status: Married Patient Roles: Spouse;Parent;Other (Comment) (Employee) Spouse/Significant Other: Jana Half  [phone]  [phone]  [phone] Children: Two local grown children  Other Supports:  Friends and church members Anticipated Caregiver: Wife and Pt Ability/Limitations of Caregiver: Wife to take one week off to be with pt then go back to work Caregiver Availability: Other (Comment) (Wife to be with for the first week) Family Dynamics: Close knit family who are supportive of one another and will do what is needed for the other.  Pt feels he will do well once he is past the surgical pain-he can already feel a difference, MD straightened his legs in surgery and pt loves it.  For once in his live his legs are not bowed.  Social History Preferred language: English Religion: [employer] Cultural Background: No issues Education: Scientist, clinical (histocompatibility and immunogenetics) Read: Yes Write: Yes Employment Status: Employed Name of Employer: Self employed Return to Work Plans: Plans to return once healed and recovered for this Freight forwarder Issues: No issues Guardian/Conservator: None-according to MD pt is capable fo making his own decisions while here   Abuse/Neglect Physical Abuse: Denies Verbal Abuse: Denies Sexual Abuse: Denies Exploitation of patient/patient's resources: Denies Self-Neglect: Denies  Emotional Status Pt's affect, behavior adn adjustment status: Pt si motivated and ready to begin therapies, he is not having as much pain as he thought he would.  He is feeling optimistic regarding rehab and recovery.  He has always been one to be independent and plans to regain  this with less pain. Recent Psychosocial Issues: Healthy PTA-knees biggest issue Pyschiatric History: No history deferred depression screen due to pt doing well with surgery. Substance Abuse History: No issues  Patient / Family Perceptions, Expectations & Goals Pt/Family understanding of illness & functional limitations: Pt has a good understanding of his surgery and the treatment plan for him.  He is having less pain than before the surgery, which surprises him.  He is ready to work and go home soon, but wants  to be independent before leaving. Premorbid pt/family roles/activities: Husband, Father, Employee, [place] member, etc Anticipated changes in roles/activities/participation: resume Pt/family expectations/goals: Pt states: " I want to be able to do for myself, like I always have."  " I am ready to work in therapy."  [employer]: None Premorbid Home Care/DME Agencies: None Transportation available at discharge: Wife  Discharge Planning Living Arrangements: Spouse/significant other Support Systems: Spouse/significant other;Children;Friends/neighbors;Church/faith community Type of Residence: Private residence Insurance underwriter Resources: Education officer, museum (specify) Nurse, mental health) Financial Resources: Employment;Family Support Financial Screen Referred: No Living Expenses: Lives with family Money Management: Spouse;Patient Does the patient have any problems obtaining your medications?: No Home Management: Both he and wife Patient/Family Preliminary Plans: Return home with wife taking off one week from work to assist with the transition home.  Pt should do very well, motivated and moving well already. Social Work Anticipated Follow Up Needs: HH/OP  Clinical Impression Pleasant gentleman who is motivated and ready to work in therapy.  He reports not much pain and begin happy had the surgery on knees done together.  Wife to be with for one week and then return to work.  Should do well here and be a short length of stay.    Elease Hashimoto 02/20/2014, 10:03 AM

## 2014-02-20 NOTE — Progress Notes (Signed)
PMR Admission Coordinator Pre-Admission Assessment  Patient: James Atkinson is an 68 y.o., male  MRN: [id number]  DOB: 1945/12/27  Height: 5\' 5"  (165.1 cm)  Weight: 70.308 kg (155 lb)  Insurance Information  HMO: PPO: yes PCP: IPA: 80/20: OTHER:  PRIMARY: [employer] [place] Policy#: [id number] Subscriber: wife  CM Name: Lemmie Evens Phone#: [phone] Fax#: [phone]  Pre-Cert#: [id number] approved for 2 days. Request our initial evals by 8/19 for continued authorization Employer: [employer]  Benefits: Phone #: [phone] Name: 8/14  Eff. Date: 12/04/13 Deduct: $150 Out of Pocket Max: $3000 Life Max: none  CIR: 80% SNF: 80% 60 days  Outpatient: 80% Co-Pay: based on medical neccessity  Home Health: 80% Co-Pay: based on medical neccessity  DME: 80% Co-Pay: 20%  Providers: in network   SECONDARY: Medicare a and b Policy#: [id number] Subscriber: pt  Medicaid Application Date: Case Manager:  Disability Application Date: Case Worker:  Emergency Theatre manager Information    Name  Relation  Home  Work  [place]  Spouse  [phone]  [phone]  [phone]      Current Medical History  Patient Admitting Diagnosis: Bilateral TKR  History of Present Illness: James Atkinson is a 68 y.o. male with history of HTN, GERD, dysphagia s/p dilatation, DJD/DDD with endstage changes bilateral knees and failure of conservative therapy. He elected to undergo B-TKR on 02/14/14 by Dr. Chanetta Marshall. Post op is WBAT and on coumadin for DVT prophylaxis. Has epidural for pain control but developed hypotension last pm requiring fluid resuscitation as well as decrease in ropivacaine dose. Labs this am reveal acute blood loss anemia and blood pressures remain low.  Past Medical History  Past Medical History   Diagnosis  Date   .  Allergy    .  Colon polyp    .  Hypercholesteremia      diet controlled   .  Hypertension    .  GERD (gastroesophageal reflux disease)    .  History of  shingles    .  Hemorrhoids      hx of   .  Pneumonia  2012     hx of   .  H/O measles      as child   .  H/O mumps      as child   .  Arthritis      thumbs, knees, back   .  Complication of anesthesia      "hard to wake up after colonoscopy"   .  Ringing in ears     Family History  family history includes COPD in his father; Liver cancer in his mother; Lung cancer in his paternal grandfather.  Prior Rehab/Hospitalizations: none  Current Medications  Current facility-administered medications:0.9 % sodium chloride infusion, , Intravenous, Continuous, Gaynelle Arabian V, MD, 500 mL at 02/18/14 0827; acetaminophen (TYLENOL) suppository 650 mg, 650 mg, Rectal, Q6H PRN, Gearlean Alf, MD; acetaminophen (TYLENOL) tablet 650 mg, 650 mg, Oral, Q6H PRN, Gearlean Alf, MD, 650 mg at 02/19/14 0109; bisacodyl (DULCOLAX) suppository 10 mg, 10 mg, Rectal, Daily PRN, Gearlean Alf, MD  diphenhydrAMINE (BENADRYL) 12.5 MG/5ML elixir 12.5-25 mg, 12.5-25 mg, Oral, Q4H PRN, Gaynelle Arabian V, MD, 25 mg at 02/14/14 2034; diphenhydrAMINE (BENADRYL) capsule 25 mg, 25 mg, Oral, Q4H PRN, Salley Scarlet, MD; diphenhydrAMINE (BENADRYL) injection 12.5 mg, 12.5 mg, Intravenous, Q4H PRN, Salley Scarlet, MD, 12.5 mg at 02/15/14 0009; diphenhydrAMINE (BENADRYL) injection 25 mg, 25 mg, Intramuscular,  Q4H PRN, Salley Scarlet, MD  docusate sodium (COLACE) capsule 100 mg, 100 mg, Oral, BID, Gearlean Alf, MD, 100 mg at 02/19/14 6433; enoxaparin (LOVENOX) injection 30 mg, 30 mg, Subcutaneous, Q12H, Arlee Muslim, PA-C, 30 mg at 02/18/14 1943; iron polysaccharides (NIFEREX) capsule 150 mg, 150 mg, Oral, BID, Arlee Muslim, PA-C, 150 mg at 02/19/14 2951; lisinopril (PRINIVIL,ZESTRIL) tablet 20 mg, 20 mg, Oral, q morning - 10a, Arlee Muslim, PA-C, 20 mg at 02/18/14 1015  menthol-cetylpyridinium (CEPACOL) lozenge 3 mg, 1 lozenge, Oral, PRN, Gearlean Alf, MD, 3 mg at 02/14/14 2300; methocarbamol (ROBAXIN) 500 mg in dextrose 5 %  50 mL IVPB, 500 mg, Intravenous, Q6H PRN, Gearlean Alf, MD; methocarbamol (ROBAXIN) tablet 500 mg, 500 mg, Oral, Q6H PRN, Gearlean Alf, MD, 500 mg at 02/18/14 1942; metoCLOPramide (REGLAN) injection 5-10 mg, 5-10 mg, Intravenous, Q8H PRN, Gearlean Alf, MD  metoCLOPramide (REGLAN) tablet 5-10 mg, 5-10 mg, Oral, Q8H PRN, Gearlean Alf, MD; morphine 2 MG/ML injection 1-2 mg, 1-2 mg, Intravenous, Q1H PRN, Gearlean Alf, MD, 2 mg at 02/16/14 1733; naloxone (NARCAN) 2 mg in dextrose 5 % 250 mL infusion, 1-4 mcg/kg/hr, Intravenous, Continuous PRN, Salley Scarlet, MD; naloxone [facility]) injection 0.4 mg, 0.4 mg, Intravenous, PRN, Salley Scarlet, MD  ondansetron (ZOFRAN) injection 4 mg, 4 mg, Intravenous, Q6H PRN, Gearlean Alf, MD; ondansetron (ZOFRAN) tablet 4 mg, 4 mg, Oral, Q6H PRN, Gaynelle Arabian V, MD; oxyCODONE (Oxy IR/ROXICODONE) immediate release tablet 5-20 mg, 5-20 mg, Oral, Q3H PRN, Gaynelle Arabian V, MD, 10 mg at 02/19/14 0756; pantoprazole (PROTONIX) EC tablet 40 mg, 40 mg, Oral, Daily, Gaynelle Arabian V, MD, 40 mg at 02/19/14 0903  phenol (CHLORASEPTIC) mouth spray 1 spray, 1 spray, Mouth/Throat, PRN, Gaynelle Arabian V, MD; polyethylene glycol (MIRALAX / GLYCOLAX) packet 17 g, 17 g, Oral, Daily PRN, Gaynelle Arabian V, MD, 17 g at 02/18/14 2124; sodium chloride 0.9 % injection 3 mL, 3 mL, Intravenous, PRN, Salley Scarlet, MD; traMADol Veatrice Bourbon) tablet 50-100 mg, 50-100 mg, Oral, Q6H PRN, Gearlean Alf, MD, 100 mg at 02/16/14 1721  warfarin (COUMADIN) tablet 7.5 mg, 7.5 mg, Oral, ONCE-1800, Clovis Riley, [facility]; Warfarin - Pharmacist Dosing Inpatient, , Does not apply, q1800, Emiliano Dyer, [facility]  Patients Current Diet: General  Precautions / Restrictions  Precautions  Precautions: Knee;Fall  Restrictions  Weight Bearing Restrictions: No  Other Position/Activity Restrictions: WBAT  Prior Activity Level  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, self employed Actor  / Equipment  Home Assistive Devices/Equipment: Eyeglasses  Prior Functional Level  Prior Function  Level of Independence: Independent  Current Functional Level  Cognition  Overall Cognitive Status: Within Functional Limits for tasks assessed  Orientation Level: Oriented to person;Oriented to place;Disoriented to time   Extremity Assessment  (includes Sensation/Coordination)  Upper Extremity Assessment: Overall WFL for tasks assessed  Lower Extremity Assessment: RLE deficits/detail;LLE deficits/detail  RLE Deficits / Details: 2-/5 quads with AAROM at knee -10- 85  LLE Deficits / Details: 2/5 quads with AAROM at knee -15 - 45 with muscle guarding limiting  Cervical / Trunk Assessment: Normal   ADLs  Overall ADL's : Needs assistance/impaired  Eating/Feeding: Independent;Sitting  Grooming: Wash/dry face;Set up;Sitting  Upper Body Bathing: Set up;Supervision/ safety;Sitting  Lower Body Bathing: +2 for physical assistance;Maximal assistance;Sit to/from stand  Upper Body Dressing : Minimal assistance;Sitting  Upper Body Dressing Details (indicate cue type and reason): with gown  Lower Body Dressing: +2 for physical assistance;Total  assistance;Sit to/from stand  Lower Body Dressing Details (indicate cue type and reason): not able to reach to bilateral foot; unable to let go of walker in standing to manage clothing. Requiring walker for UE support to stand.  Toilet Transfer: +2 for physical assistance;Moderate assistance;RW  Toilet Transfer Details (indicate cue type and reason): took a few steps away from EOB and then became nauseous and had to sit.  Toileting- Clothing Manipulation and Hygiene: +2 for physical assistance;Total assistance;Sit to/from stand  General ADL Comments: Educated pt and wife on AE options at the end of the session once pt feeling better. He had become nauseous after taking a few steps with walker and needed to sit. Pt is very motivated to participate but limited by the  nausea and also a drop in BP with activity.   Mobility  Overal bed mobility: Needs Assistance  Bed Mobility: Sit to Supine  Supine to sit: Mod assist  Sit to supine: Mod assist  General bed mobility comments: cues for sequence with physical assist for LE management   Transfers  Overall transfer level: Needs assistance  Equipment used: Rolling walker (2 wheeled)  Transfers: Sit to/from Stand  Sit to Stand: Mod assist;+2 physical assistance  General transfer comment: cues for LE management and use of UEs to self assist   Ambulation / Gait / Stairs / Wheelchair Mobility  Ambulation/Gait  Ambulation/Gait assistance: Mod assist  Ambulation Distance (Feet): 56 Feet (and 6 to [facility]  Assistive device: Rolling walker (2 wheeled)  Gait Pattern/deviations: Step-to pattern;Decreased step length - right;Decreased step length - left;Shuffle;Trunk flexed  Gait velocity: decr  General Gait Details: cues for posture, sequence, wt shift and position from RW   Posture / Balance    Special needs/care consideration  BiPAP/CPAP No  CPM Yes, alternating bilaterally to knees  Continuous Drip IV No  Dialysis No  Life Vest No  Oxygen No  Special Bed No  Trach Size No  Wound Vac (area) No  Skin Bilateral knee incisions  Bowel mgmt: Last BM 02/18/14  Bladder mgmt: Using urinal without difficulty  Diabetic mgmt No   Previous Home Environment  Living Arrangements: Spouse/significant other  Lives With: Spouse  Available Help at Discharge: Available 24 hours/day;Other (Comment) (wife can take off one week after d/c)  Type of Home: House  Home Layout: One level  Home Access: Stairs to enter  [employer] of Steps: 1 to 2  Bathroom Shower/Tub: Administrator, Civil Service: Standard  Bathroom Accessibility: Yes  How Accessible: Accessible via walker  [place]: No  Discharge Living Setting  Plans for Discharge Living Setting: Patient's home;Lives with (comment);Other (Comment) (wife)   Type of Home at Discharge: House  Discharge Home Layout: One level  Discharge Home Access: Stairs to enter  Entrance Stairs-Number of Steps: 1 to 2  Discharge Bathroom Shower/Tub: Tub/shower unit  Discharge Bathroom Toilet: Standard  Discharge Bathroom Accessibility: Yes  How Accessible: Accessible via walker  Does the patient have any problems obtaining your medications?: No  Social/Family/Support Systems  Patient Roles: Spouse;Parent;Other (Comment) (self employed electrician)  Contact Information: Kerby Borner, wife  Anticipated Caregiver: wife  Anticipated Caregiver's Contact Information: see above  Ability/Limitations of Caregiver: wife works fulltime at [employer], can take off one week after d/c  Caregiver Availability: 24/7  Discharge Plan Discussed with Primary Caregiver: Yes  Is Caregiver In Agreement with Plan?: Yes  Does Caregiver/Family have Issues with Lodging/Transportation while Pt is in Rehab?: No  Goals/Additional Needs  Patient/Family Goal for Rehab: Mod I with PT and OT  Expected length of stay: ELOS 7 days  Pt/Family Agrees to Admission and willing to participate: Yes  Program Orientation Provided & Reviewed with Pt/Caregiver Including Roles & Responsibilities: Yes  Decrease burden of Care through IP rehab admission: n/a  Possible need for SNF placement upon discharge: n/a  Patient Condition: This patient's medical and functional status has changed since the consult dated: 02/15/14 in which the Rehabilitation Physician determined and documented that the patient's condition is appropriate for intensive rehabilitative care in an inpatient rehabilitation facility. See "History of Present Illness" (above) for medical update. Functional changes are: Currently requiring mod assist to ambulate 56 ft RW. Patient's medical and functional status update has been discussed with the Rehabilitation physician and patient remains appropriate for inpatient rehabilitation.  Will admit to inpatient rehab today.  Preadmission Screen Completed By: Retta Diones, 02/19/2014 11:28 AM  ______________________________________________________________________  Discussed status with Dr. Naaman Plummer on 02/19/14 at 1133 and received telephone approval for admission today.  Admission Coordinator: Retta Diones, time1133/Date08/17/15  Cosigned by: Meredith Staggers, MD [02/19/2014 1:15 PM]   Revision History.Marland KitchenMarland Kitchen

## 2014-02-20 NOTE — Progress Notes (Signed)
Physical Medicine and Rehabilitation Consult  Reason for Consult: Bilateral Knee OA  Referring Physician: Dr. [name].  HPI: James Atkinson is a 68 y.o. male with history of HTN, GERD, dysphagia s/p dilatation, DJD/DDD with endstage changes bilateral knees and failure of conservative therapy. He elected to undergo B-TKR on 02/14/14 by Dr. Chanetta Marshall. Post op is WBAT and on coumadin for DVT prophylaxis. Has epidural for pain control but developed hypotension last pm requiring fluid resuscitation as well as decrease in ropivacaine dose. Labs this am reveal acute blood loss anemia and blood pressures remain low. PT/OT evaluations to be done today. CIR recommended by MD.  Pt denies dizziness pain well controlled with epidural catheter  Legs feel funny  Review of Systems  Constitutional: Negative.  Eyes: Negative.  Respiratory: Negative.  Cardiovascular: Negative.  Gastrointestinal: Positive for constipation.  Genitourinary:  Foley  Musculoskeletal: Positive for joint pain and neck pain.  Skin: Negative.  Neurological: Positive for tingling and focal weakness.  Endo/Heme/Allergies: Negative.  Psychiatric/Behavioral: Negative.   Past Medical History   Diagnosis  Date   .  Allergy    .  Colon polyp    .  Hypercholesteremia      diet controlled   .  Hypertension    .  GERD (gastroesophageal reflux disease)    .  History of shingles    .  Hemorrhoids      hx of   .  Pneumonia  2012     hx of   .  H/O measles      as child   .  H/O mumps      as child   .  Arthritis      thumbs, knees, back   .  Complication of anesthesia      "hard to wake up after colonoscopy"   .  Ringing in ears     Past Surgical History   Procedure  Laterality  Date   .  Rotator cuff repair  Left  7 years ago   .  Knee arthroscopy  Left  23 years ago   .  Colonoscopy       x2   .  Esophageal dilation     .  Hemorrhoid banding       x8   .  Nasal septum surgery   25 years ago   .  Tonsillectomy   2nd  grade   .  Hernia repair  Bilateral  2012    Family History   Problem  Relation  Age of Onset   .  COPD  Father    .  Liver cancer  Mother    .  Lung cancer  Paternal Grandfather     Social History: Married. Works full time. He smoked 1 PPD--quit four months ago. His smoking use included Cigarettes. He has a 10 pack-year smoking history. He quit smokeless tobacco use about 25 years ago. His smokeless tobacco use included Chew. He reports that he does not drink alcohol or use illicit drugs.  Allergies   Allergen  Reactions   .  Ciprofloxacin  Nausea Only    Medications Prior to Admission   Medication  Sig  Dispense  Refill   .  Cinnamon 500 MG capsule  Take 1,500 mg by mouth 2 (two) times daily.     Marland Kitchen  ibuprofen (ADVIL,MOTRIN) 200 MG tablet  Take 400 mg by mouth 2 (two) times daily as needed (Pain).     Marland Kitchen  lisinopril (PRINIVIL,ZESTRIL) 20 MG tablet  Take 20 mg by mouth every morning.     .  Multiple Vitamin (MULTIVITAMIN WITH MINERALS) TABS tablet  Take 1 tablet by mouth daily.     .  Omega-3 Fatty Acids (FISH OIL PO)  Take by mouth daily.     Marland Kitchen  OVER THE COUNTER MEDICATION  Take 1 tablet by mouth 2 (two) times daily. Beta Prostate     .  pantoprazole (PROTONIX) 40 MG tablet  Take 1 tablet (40 mg total) by mouth daily.  90 tablet  3   .  Flax Oil-Fish Oil-Borage Oil (FISH OIL-FLAX OIL-BORAGE OIL PO)  Take 2 capsules by mouth 2 (two) times daily.     .  naphazoline-pheniramine (EYE ALLERGY RELIEF) 0.025-0.3 % ophthalmic solution  Place 1 drop into both eyes 4 (four) times daily as needed for irritation.      Home:  Home Living  Family/patient expects to be discharged to:: Inpatient rehab  Living Arrangements: Spouse/significant other  Functional History:   Functional Status:  Mobility:      ADL:   Cognition:  Cognition  Orientation Level: Oriented X4   Blood pressure 95/45, pulse 50, temperature 97.6 F (36.4 C), temperature source Oral, resp. rate 16, height 5\' 5"  (1.651  m), weight 70.308 kg (155 lb), SpO2 98.00%.  Physical Exam  Nursing note and vitals reviewed.  Constitutional: He is oriented to person, place, and time. He appears well-developed and well-nourished.  HENT:  Head: Normocephalic and atraumatic.  Right Ear: External ear normal.  Left Ear: External ear normal.  Eyes: Conjunctivae and EOM are normal. Pupils are equal, round, and reactive to light.  Neck: Normal range of motion. Neck supple.  Cardiovascular: Normal rate, regular rhythm and normal heart sounds.  Respiratory: Effort normal and breath sounds normal.  GI: Soft. He exhibits distension.  Musculoskeletal:  Bilateral Knee orthoses, with ice packs Did not attempt prox LE ROM FROM at ankles And BUEs  Neurological: He is alert and oriented to person, place, and time.  Skin: Skin is warm and dry.  Psychiatric: He has a normal mood and affect.   Results for orders placed during the hospital encounter of 02/14/14 (from the past 24 hour(s))   TYPE AND SCREEN Status: None    Collection Time    02/14/14 9:30 AM   Result  Value  Ref Range    ABO/RH(D)  O POS     Antibody Screen  NEG     Sample Expiration  02/17/2014    ABO/RH Status: None    Collection Time    02/14/14 9:30 AM   Result  Value  Ref Range    ABO/RH(D)  O POS    CBC Status: Abnormal    Collection Time    02/15/14 4:31 AM   Result  Value  Ref Range    WBC  7.3  4.0 - 10.5 K/uL    RBC  3.20 (*)  4.22 - 5.81 MIL/uL    Hemoglobin  9.9 (*)  13.0 - 17.0 g/dL    HCT  28.7 (*)  39.0 - 52.0 %    MCV  89.7  78.0 - 100.0 fL    MCH  30.9  26.0 - 34.0 pg    MCHC  34.5  30.0 - 36.0 g/dL    RDW  12.2  11.5 - 15.5 %    Platelets  139 (*)  150 - 400 K/uL   BASIC METABOLIC PANEL Status: Abnormal  Collection Time    02/15/14 4:31 AM   Result  Value  Ref Range    Sodium  140  137 - 147 mEq/L    Potassium  4.7  3.7 - 5.3 mEq/L    Chloride  107  96 - 112 mEq/L    CO2  24  19 - 32 mEq/L    Glucose, Bld  152 (*)  70 - 99  mg/dL    BUN  17  6 - 23 mg/dL    Creatinine, Ser  0.88  0.50 - 1.35 mg/dL    Calcium  8.8  8.4 - 10.5 mg/dL    GFR calc non Af Amer  88 (*)  >90 mL/min    GFR calc Af Amer  >90  >90 mL/min    Anion gap  9  5 - 15   PROTIME-INR Status: None    Collection Time    02/15/14 4:31 AM   Result  Value  Ref Range    Prothrombin Time  14.5  11.6 - 15.2 seconds    INR  1.13  0.00 - 1.49    No results found.  Assessment/Plan:  Diagnosis: Bilateral end stage OA of Knees POD #1 Bilateral TKR  1. Does the need for close, 24 hr/day medical supervision in concert with the patient's rehab needs make it unreasonable for this patient to be served in a less intensive setting? Potentially 2. Co-Morbidities requiring supervision/potential complications: hand OA, Chronic neck and shoulder pain 3. Due to bladder management, bowel management, safety, skin/wound care, disease management, medication administration, pain management and patient education, does the patient require 24 hr/day rehab nursing? Potentially 4. Does the patient require coordinated care of a physician, rehab nurse, PT (1-2 hrs/day, 5 days/week) and OT (1-2 hrs/day, 5 days/week) to address physical and functional deficits in the context of the above medical diagnosis(es)? Potentially Addressing deficits in the following areas: balance, endurance, locomotion, strength, transferring, bowel/bladder control, bathing, dressing and toileting 5. Can the patient actively participate in an intensive therapy program of at least 3 hrs of therapy per day at least 5 days per week? Potentially 6. The potential for patient to make measurable gains while on inpatient rehab is excellent 7. Anticipated functional outcomes upon discharge from inpatient rehab are modified independent with PT, modified independent with OT, n/a with SLP. 8. Estimated rehab length of stay to reach the above functional goals is: 7d 9. Does the patient have adequate social supports to  accommodate these discharge functional goals? Yes 10. Anticipated D/C setting: Home 11. Anticipated post D/C treatments: [place] therapy 12. Overall Rehab/Functional Prognosis: excellent RECOMMENDATIONS:  This patient's condition is appropriate for continued rehabilitative care in the following setting: CIR once off epidural and tolerating PT/OT  Patient has agreed to participate in recommended program. Yes  Note that insurance prior authorization may be required for reimbursement for recommended care.  Comment:  02/15/2014  Revision History...      Date/Time User Action    02/15/2014 10:51 AM Charlett Blake, MD Sign    02/15/2014 9:33 AM Bary Leriche, PA-C Pend   View Details Report    Routing History.Marland KitchenMarland Kitchen

## 2014-02-20 NOTE — Progress Notes (Signed)
Patient information reviewed and entered into [facility] System by Becky Yuriana Gaal, covering PPS coordinator. Information including medical coding and functional independence measure will be reviewed and updated through discharge.  Per nursing, patient was given "Data Collection Information Summary for Patients in Inpatient Rehabilitation Facilities with attached Privacy Act Statement Health Care Records" upon admission.     

## 2014-02-20 NOTE — Progress Notes (Signed)
  [facility] PHYSICAL MEDICINE & REHABILITATION     PROGRESS NOTE    Subjective/Complaints: No issues overnight. No bm yet. Pain controlled  Objective: Vital Signs: Blood pressure 114/70, pulse 71, temperature 98.5 F (36.9 C), temperature source Oral, resp. rate 19, SpO2 97.00%. No results found.  Recent Labs  02/18/14 0556 02/20/14 0527  WBC 5.6 5.1  HGB 7.7* 8.1*  HCT 22.4* 23.0*  PLT 111* 188    Recent Labs  02/20/14 0527  NA 137  K 4.4  CL 100  GLUCOSE 111*  BUN 20  CREATININE 0.81  CALCIUM 8.8   CBG (last 3)  No results found for this basename: GLUCAP,  in the last 72 hours  Wt Readings from Last 3 Encounters:  02/14/14 70.308 kg (155 lb)  02/14/14 70.308 kg (155 lb)  02/05/14 70.308 kg (155 lb)    Physical Exam:  Constitutional: He is oriented to person, place, and time. He appears well-developed and well-nourished.  HENT: oral mucosa pink and moist  Head: Normocephalic and atraumatic.  Right Ear: External ear normal.  Left Ear: External ear normal.  Eyes: Conjunctivae and EOM are normal. Pupils are equal, round, and reactive to light.  Neck: Normal range of motion. Neck supple.  Cardiovascular: Normal rate, regular rhythm and normal heart sounds. No murmurs or rubs  Respiratory: Effort normal and breath sounds normal. No wheezes or rales  GI: Soft. He exhibits distension.  Musculoskeletal:  Bilateral Knee with decreased edema, PROM to about 60-70 degrees of flexion   Neurological: He is alert and oriented to person, place, and time. UES 5/5. LE: HF 3/5, KE 1/5, ADF/APF 4+. Normal sensory exam. CN exam normal.  Skin: Skin is warm and dry. Wounds are dry/intact/well-approximated Psychiatric: He has a normal mood and affect    Assessment/Plan: 1. Functional deficits secondary to bilateral TKA's due to endstage OA of knees which require 3+ hours per day of interdisciplinary therapy in a comprehensive inpatient rehab setting. Physiatrist is providing  close team supervision and 24 hour management of active medical problems listed below. Physiatrist and rehab team continue to assess barriers to discharge/monitor patient progress toward functional and medical goals. FIM:                   Comprehension Comprehension Mode: Auditory Comprehension: 6-Follows complex conversation/direction: With extra time/assistive device  Expression Expression Mode: Verbal Expression: 6-Expresses complex ideas: With extra time/assistive device  Social Interaction Social Interaction: 6-Interacts appropriately with others with medication or extra time (anti-anxiety, antidepressant).  Problem Solving Problem Solving: 5-Solves complex 90% of the time/cues < 10% of the time  Memory Memory: 6-More than reasonable amt of time  Medical Problem List and Plan:  1. Functional deficits secondary to Bilateral Knee DJD.  2. DVT Prophylaxis/Anticoagulation: Pharmaceutical: Coumadin and Lovenox till INR therapeutic  3. Pain Management: Will continue oxycodone prn. Ice in between therapy sessions.  4. Mood: LCSW to follow for evaluation and support.  5. Neuropsych: This patient is capable of making decisions on his own behalf.  6. Skin/Wound Care: Monitor wound daily. Will add nutritional supplement for wound healing.  7. HTN: Will monitor BP every 8 hours. Hold lisinopril for SBP less than 120. Push po fluids.  8. ABLA: Continue iron supplement. hgb up to 8.1 today  9. Constipation: Will schedule miralax bid and titrate as bowels function start becoming regular LOS (Days) 1 A FACE TO FACE EVALUATION WAS PERFORMED  SWARTZ,ZACHARY T 02/20/2014 8:38 AM

## 2014-02-20 NOTE — Care Management Note (Signed)
[facility] Individual Statement of Services  Patient Name:  James Atkinson  Date:  02/20/2014  Welcome to the [facility].  Our goal is to provide you with an individualized program based on your diagnosis and situation, designed to meet your specific needs.  With this comprehensive rehabilitation program, you will be expected to participate in at least 3 hours of rehabilitation therapies Monday-Friday, with modified therapy programming on the weekends.  Your rehabilitation program will include the following services:  Physical Therapy (PT), Occupational Therapy (OT), 24 hour per day rehabilitation nursing, Case Management (Social Worker), Rehabilitation Medicine, Nutrition Services and Pharmacy Services  Weekly team conferences will be held on Wednesday to discuss your progress.  Your Social Worker will talk with you frequently to get your input and to update you on team discussions.  Team conferences with you and your family in attendance may also be held.  Expected length of stay: 5-7 days  Overall anticipated outcome: mod/i level  Depending on your progress and recovery, your program may change. Your Social Worker will coordinate services and will keep you informed of any changes. Your Social Worker's name and contact numbers are listed  below.  The following services may also be recommended but are not provided by the [facility] will be made to provide these services after discharge if needed.  Arrangements include referral to agencies that provide these services.  Your insurance has been verified to be:  [employer] Your primary doctor is:  Shirline Frees  Pertinent information will be shared with your doctor and your insurance company.  Social Worker:  Ovidio Kin, [place] or [phone]  Information discussed with and copy given to patient by: Elease Hashimoto, 02/20/2014, 10:04 AM

## 2014-02-20 NOTE — Progress Notes (Signed)
ANTICOAGULATION CONSULT NOTE - Follow Up Consult  Pharmacy Consult for coumadin Indication: VTE prophylaxis  Allergies  Allergen Reactions  . Ciprofloxacin Nausea Only    Patient Measurements:   Heparin Dosing Weight:   Vital Signs: Temp: 98.5 F (36.9 C) (08/18 0609) Temp src: Oral (08/18 0609) BP: 114/70 mmHg (08/18 0609) Pulse Rate: 71 (08/18 0609)  Labs:  Recent Labs  02/18/14 0556 02/19/14 0435 02/20/14 0527  HGB 7.7*  --  8.1*  HCT 22.4*  --  23.0*  PLT 111*  --  188  LABPROT 17.4* 17.7* 19.4*  INR 1.42 1.46 1.64*  CREATININE  --   --  0.81    The CrCl is unknown because both a height and weight (above a minimum accepted value) are required for this calculation.   Medications:  Scheduled:  . enoxaparin (LOVENOX) injection  30 mg Subcutaneous Q12H  . iron polysaccharides  150 mg Oral BID  . lisinopril  20 mg Oral q morning - 10a  . pantoprazole  40 mg Oral Daily  . polyethylene glycol  17 g Oral BID  . Warfarin - Pharmacist Dosing Inpatient   Does not apply q1800   Infusions:    Assessment: 67yoM s/p bilateral TKA is on coumadin for VTE prophyalxis. Patient is also on lovenox until INR is therapeutic.   INR 1.64 today- trending up (seem like dose missed on 08/16) ABLA, Hgb up to 8.1 (12.7 pre-op) and Plt 188 K No bleeding reported  On regular diet but appears intake Warfarin education provided on 8/14  On enoxaparin 30mg  SQ q12h  Goal of Therapy:  INR 2-3  Monitor platelets by anticoagulation protocol: Yes   Plan:  Warfarin 5 mg PO x 1 today  PT/INR  Rigby Leonhardt, [name] 02/20/2014,8:08 AM

## 2014-02-20 NOTE — Evaluation (Signed)
Physical Therapy Assessment and Plan  Patient Details  Name: James Atkinson MRN: [id number] Date of Birth: [birth year]  PT Diagnosis: Difficulty walking, Muscle weakness and Pain in bilateral knees Rehab Potential: Excellent ELOS: 5-7 days   Today's Date: 02/20/2014 PT Individual Time: 1740-8144 PT Individual Time Calculation (min): 72 min    Problem List:  Patient Active Problem List   Diagnosis Date Noted  . Postoperative anemia due to acute blood loss 02/16/2014  . OA (osteoarthritis) of knee 02/14/2014  . Esophageal dysphagia 01/03/2014    Past Medical History:  Past Medical History  Diagnosis Date  . Allergy   . Colon polyp   . Hypercholesteremia     diet controlled  . Hypertension   . GERD (gastroesophageal reflux disease)   . History of shingles   . Hemorrhoids     hx of  . Pneumonia 2012    hx of  . H/O measles     as child  . H/O mumps     as child  . Arthritis     thumbs, knees, back  . Complication of anesthesia     "hard to wake up after colonoscopy"  . Ringing in ears    Past Surgical History:  Past Surgical History  Procedure Laterality Date  . Rotator cuff repair Left 7 years ago  . Knee arthroscopy Left 23 years ago  . Colonoscopy      x2  . Esophageal dilation    . Hemorrhoid banding      x8   . Nasal septum surgery  25 years ago  . Tonsillectomy  2nd grade  . Hernia repair Bilateral 2012  . Total knee arthroplasty Bilateral 02/14/2014    Procedure: BILATERAL TOTAL KNEE ARTHROPLASTY;  Surgeon: Gearlean Alf, MD;  Location: [facility];  Service: Orthopedics;  Laterality: Bilateral;    Assessment & Plan Clinical Impression: James Atkinson is a 68 y.o. male with history of HTN, GERD, dysphagia s/p dilatation, DJD/DDD with endstage changes bilateral knees and failure of conservative therapy. He elected to undergo B-TKR on 02/14/14 by Dr. Chanetta Marshall. Post op is WBAT and on coumadin for DVT prophylaxis. Has epidural for pain control but developed  hypotension last pm requiring fluid resuscitation. ABLA being monitored and blood pressures improving. Post op with constipation resolving. Patient transferred to CIR on 02/19/2014 .   Patient currently requires min with mobility secondary to muscle weakness and B knee pain and decreased cardiorespiratoy endurance.  Prior to hospitalization, patient was independent  with mobility and lived with Spouse in a House home.  Home access is 3 total, RW will fit on stepStairs to enter.  Patient will benefit from skilled PT intervention to maximize safe functional mobility, minimize fall risk and decrease caregiver burden for planned discharge home with intermittent assist.  Anticipate patient will benefit from follow up OP at discharge.  PT - End of Session Activity Tolerance: Decreased this session;Tolerates 30+ min activity with multiple rests Endurance Deficit: Yes PT Assessment Rehab Potential: Excellent Barriers to Discharge: Inaccessible home environment PT Patient demonstrates impairments in the following area(s): Balance;Endurance;Edema;Pain;Safety PT Transfers Functional Problem(s): Bed to Chair;Bed Mobility;Car;Furniture PT Locomotion Functional Problem(s): Ambulation;Wheelchair Mobility;Stairs PT Plan PT Intensity: Minimum of 1-2 x/day ,45 to 90 minutes PT Frequency: 5 out of 7 days PT Duration Estimated Length of Stay: 5-7 days PT Treatment/Interventions: Ambulation/gait training;Balance/vestibular training;Community reintegration;Discharge planning;DME/adaptive equipment instruction;Functional mobility training;Neuromuscular re-education;Pain management;Patient/family education;Psychosocial support;Stair training;Therapeutic Activities;Therapeutic Exercise;UE/LE Strength taining/ROM;UE/LE Coordination activities;Wheelchair propulsion/positioning PT Transfers Anticipated Outcome(s):  mod I PT Locomotion Anticipated Outcome(s): mod I PT Recommendation Follow Up Recommendations: Outpatient  PT Patient destination: Home Equipment Recommended: Rolling walker with 5" wheels Equipment Details: pt has tub bench  Skilled Therapeutic Intervention Skilled therapeutic intervention initiated after completion of evaluation. Discussed with patient falls risk, safety within room, and focus of therapy during stay. Discussed possible LOS, goals, and f/u therapy.  PT Evaluation Precautions/Restrictions Precautions Precautions: Knee;Fall Required Braces or Orthoses: Knee Immobilizer - Right;Knee Immobilizer - Left Knee Immobilizer - Right: Discontinue once straight leg raise with < 10 degree lag Knee Immobilizer - Left: Discontinue once straight leg raise with < 10 degree lag Restrictions Weight Bearing Restrictions: Yes RLE Weight Bearing: Weight bearing as tolerated LLE Weight Bearing: Weight bearing as tolerated General Chart Reviewed: Yes Family/Caregiver Present: No  Vital SignsTherapy Vitals Pulse Rate: 77 BP: 108/52 mmHg Patient Position (if appropriate): Sitting Oxygen Therapy SpO2: 95 % O2 Device: None (Room air) Pain Pain Assessment Pain Assessment: 0-10 Pain Score: 6  Pain Type: Surgical pain Pain Location: Knee Pain Orientation: Right;Left Pain Descriptors / Indicators: Aching Pain Frequency: Intermittent Pain Onset: With Activity Patients Stated Pain Goal: 2 Pain Intervention(s): Repositioned;Rest Home Living/Prior Functioning Home Living Living Arrangements: Spouse/significant other Available Help at Discharge: Available 24 hours/day Type of Home: House Home Access: Stairs to enter [employer] of Steps: 3 total, RW will fit on step Entrance Stairs-Rails: None Home Layout: One level Additional Comments: Pt's wife purchased tub bench  Lives With: Spouse Prior Function Level of Independence: Independent with basic ADLs;Independent with homemaking with ambulation;Independent with gait;Independent with transfers  Able to Take Stairs?:  Yes Driving: Yes Vocation: Full time employment 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, travel Vision/Perception  Vision - Assessment Additional Comments: WFL Perception Comments: WFL  Cognition Overall Cognitive Status: Within Functional Limits for tasks assessed Sensation Sensation Light Touch: Appears Intact Stereognosis: Appears Intact Hot/Cold: Appears Intact Proprioception: Appears Intact Coordination Gross Motor Movements are Fluid and Coordinated: Yes Fine Motor Movements are Fluid and Coordinated: Yes Motor  Motor Motor: Within Functional Limits  Mobility Bed Mobility Bed Mobility: Supine to Sit;Sit to Supine Supine to Sit: 5: Supervision Sit to Supine: 4: Min assist Transfers Transfers: Yes Stand Pivot Transfers: 4: Min assist;With armrests Stand Pivot Transfer Details: Verbal cues for technique;Verbal cues for precautions/safety;Verbal cues for safe use of DME/AE Locomotion  Ambulation Ambulation: Yes Ambulation/Gait Assistance: 4: Min assist Ambulation Distance (Feet): 100 Feet Assistive device: Rolling walker Gait Gait: Yes Gait Pattern: Impaired Gait Pattern: Step-to pattern;Trunk flexed;Decreased trunk rotation;Decreased hip/knee flexion - right;Decreased hip/knee flexion - left (with KIs donned) Stairs / Additional Locomotion Curb: 3: Mod assist (with RW) Architect: Yes Wheelchair Assistance: 5: Careers information officer: Both upper extremities Wheelchair Parts Management: Needs assistance Distance: 150  Trunk/Postural Assessment  Cervical Assessment Cervical Assessment: Exceptions to [facility] (forward flexed) Thoracic Assessment Thoracic Assessment: Within Functional Limits Lumbar Assessment Lumbar Assessment: Within Functional Limits Postural Control Postural Control: Within Functional Limits  Balance Dynamic Standing Balance Dynamic Standing - Level of Assistance: 4: Min  assist Extremity Assessment  RUE Assessment RUE Assessment: Within Functional Limits LUE Assessment LUE Assessment: Within Functional Limits RLE Assessment RLE Assessment: Exceptions to [facility] RLE AROM (degrees) Overall AROM Right Lower Extremity: Deficits;Due to decreased strength;Due to pain RLE Overall AROM Comments: Knee flexion to 55 deg, knee extension lacking 20 deg RLE Strength RLE Overall Strength: Deficits;Due to pain (unable to perform SLR) LLE Assessment LLE Assessment: Exceptions to WFL LLE AROM (  degrees) Overall AROM Left Lower Extremity: Deficits;Due to decreased strength;Due to pain LLE Overall AROM Comments: Knee flexion to 58 deg, knee extension lacking 20 deg LLE Strength LLE Overall Strength: Deficits;Due to pain (Unable to perform SLR)  FIM:  FIM - Control and instrumentation engineer Devices: Walker;Arm rests Bed/Chair Transfer: 5: Sit > Supine: Supervision (verbal cues/safety issues);4: Supine > Sit: Min A (steadying Pt. > 75%/lift 1 leg);4: Chair or W/C > Bed: Min A (steadying Pt. > 75%);4: Bed > Chair or W/C: Min A (steadying Pt. > 75%) FIM - Locomotion: Wheelchair Distance: 150 Locomotion: Wheelchair: 5: Travels 150 ft or more: maneuvers on rugs and over door sills with supervision, cueing or coaxing FIM - Locomotion: Ambulation Locomotion: Ambulation Assistive Devices: Administrator Ambulation/Gait Assistance: 4: Min assist Locomotion: Ambulation: 2: Travels 50 - 149 ft with minimal assistance (Pt.>75%) FIM - Locomotion: Stairs Locomotion: Scientist, physiological: Publishing copy: Stairs: 1: Up and Down < 4 stairs with moderate assistance (Pt: 50 - 74%)   Refer to Care Plan for Long Term Goals  Recommendations for other services: None  Discharge Criteria: Patient will be discharged from PT if patient refuses treatment 3 consecutive times without medical reason, if treatment goals not met, if there is a change in medical status, if  patient makes no progress towards goals or if patient is discharged from hospital.  The above assessment, treatment plan, treatment alternatives and goals were discussed and mutually agreed upon: by patient  Laretta Alstrom 02/20/2014, 1:13 PM

## 2014-02-20 NOTE — Progress Notes (Signed)
Physical Therapy Session Note  Patient Details  Name: James Atkinson MRN: [id number] Date of Birth: 09-30-1945  Today's Date: 02/20/2014 PT Individual Time: 1530-1630 PT Individual Time Calculation (min): 60 min   Short Term Goals: Week 1:  PT Short Term Goal 1 (Week 1): = LTGs due to anticipated short LOS  Skilled Therapeutic Interventions/Progress Updates:    Session focused on BLE strengthening, functional ambulation. Pt received seated in w/c, agreeable to participate in therapy. Propelled w/c w/ MinA for managing leg rests to rehab gym. SPT w/ RW w/ MinGuard to mat table. Sit<>supine w/ ModA for managing BLE. 2x10 quad sets, assisted heel slides, assisted SAQ, supine hip abd, and assisted SLR. Noted very weak quads bilaterally R>L. Extended rest breaks between each activity 2/2 pain and fatigue. Pt able to roll L/R on mat table w/ mod (I), but requires assist to move sit<>supine 2/2 BLE weakness. Pt able to don/doff KI's w/ MinA to maintain knee in extension while seated EOB. Pt ambulated 100' w/ RW w/ MinGuard w/ KI's. Pt left seated in w/c w/ all needs within reach.  Therapy Documentation Precautions:  Precautions Precautions: Knee;Fall Required Braces or Orthoses: Knee Immobilizer - Right;Knee Immobilizer - Left Knee Immobilizer - Right: Discontinue once straight leg raise with < 10 degree lag Knee Immobilizer - Left: Discontinue once straight leg raise with < 10 degree lag Restrictions Weight Bearing Restrictions: Yes RLE Weight Bearing: Weight bearing as tolerated LLE Weight Bearing: Weight bearing as tolerated General:   Vital Signs: Therapy Vitals Temp: 98.4 F (36.9 C) Temp src: Oral Pulse Rate: 78 Resp: 18 BP: 125/67 mmHg Patient Position (if appropriate): Sitting Oxygen Therapy SpO2: 100 % O2 Device: None (Room air) Pain: Pain Assessment Pain Assessment: 0-10 Pain Score: 5  Pain Type: Surgical pain Pain Location: Knee Pain Orientation: Right;Left Pain  Descriptors / Indicators: Aching Pain Onset: With Activity Pain Intervention(s): Repositioned (Pt pre-medicated prior to session)  See FIM for current functional status  Therapy/Group: Individual Therapy  Rada Hay Rada Hay, PT, DPT 02/20/2014, 5:32 PM

## 2014-02-21 ENCOUNTER — Inpatient Hospital Stay (HOSPITAL_COMMUNITY): Payer: BC Managed Care – PPO

## 2014-02-21 ENCOUNTER — Inpatient Hospital Stay (HOSPITAL_COMMUNITY): Payer: BC Managed Care – PPO | Admitting: *Deleted

## 2014-02-21 ENCOUNTER — Encounter (HOSPITAL_COMMUNITY): Payer: BC Managed Care – PPO | Admitting: Occupational Therapy

## 2014-02-21 DIAGNOSIS — M7989 Other specified soft tissue disorders: Secondary | ICD-10-CM

## 2014-02-21 DIAGNOSIS — M1710 Unilateral primary osteoarthritis, unspecified knee: Secondary | ICD-10-CM

## 2014-02-21 DIAGNOSIS — Z96659 Presence of unspecified artificial knee joint: Secondary | ICD-10-CM

## 2014-02-21 DIAGNOSIS — IMO0002 Reserved for concepts with insufficient information to code with codable children: Secondary | ICD-10-CM

## 2014-02-21 DIAGNOSIS — D62 Acute posthemorrhagic anemia: Secondary | ICD-10-CM

## 2014-02-21 DIAGNOSIS — Z5189 Encounter for other specified aftercare: Secondary | ICD-10-CM

## 2014-02-21 LAB — PROTIME-INR
INR: 1.92 — ABNORMAL HIGH (ref 0.00–1.49)
PROTHROMBIN TIME: 22 s — AB (ref 11.6–15.2)

## 2014-02-21 MED ORDER — WARFARIN SODIUM 5 MG PO TABS
5.0000 mg | ORAL_TABLET | Freq: Once | ORAL | Status: AC
  Administered 2014-02-21: 5 mg via ORAL
  Filled 2014-02-21: qty 1

## 2014-02-21 NOTE — Progress Notes (Signed)
VASCULAR LAB PRELIMINARY  PRELIMINARY  PRELIMINARY  PRELIMINARY  Bilateral lower extremity venous duplex completed.    Preliminary report:  Bilateral:  No evidence of DVT, superficial thrombosis, or Baker's Cyst.   Matisse Roskelley, RVTS 02/21/2014, 5:20 PM

## 2014-02-21 NOTE — Patient Care Conference (Signed)
Inpatient RehabilitationTeam Conference and Plan of Care Update Date: 02/21/2014   Time: 10:30 AM    Patient Name: James Atkinson      Medical Record Number: [id number]  Date of Birth: 25-Oct-1945 Sex: Male         Room/Bed: 4M03C/4M03C-01 Payor Info: Payor: [place] / Plan: BCBS PPO OUT OF STATE / Product Type: *No Product type* /    Admitting Diagnosis: B TKR  Admit Date/Time:  02/19/2014  4:40 PM Admission Comments: No comment available   Primary Diagnosis:  <principal problem not specified> Principal Problem: <principal problem not specified>  Patient Active Problem List   Diagnosis Date Noted  . Postoperative anemia due to acute blood loss 02/16/2014  . OA (osteoarthritis) of knee 02/14/2014  . Esophageal dysphagia 01/03/2014    Expected Discharge Date: Expected Discharge Date: 02/27/14  Team Members Present: Physician leading conference: Dr. Alysia Penna Social Worker Present: Ovidio Kin, LCSW Nurse Present: Rayetta Humphrey, RN PT Present: Georjean Mode, PT OT Present: Simonne Come, OT SLP Present: Windell Moulding, SLP PPS Coordinator present : Ileana Ladd, PT     Current Status/Progress Goal Weekly Team Focus  Medical   Bilateral knee pain, constipation, anemia  Maintain medical stability for home discharge  Work on establishing bowel regularity   Bowel/Bladder   continent b/b; LBM 8/18 s/p dulcolax suppository and miralax scheduled BID; uses urinal   regular bowel emptying pattern  monitor bowel pattern; assess void frequency/urgency/output for any changes   Swallow/Nutrition/ Hydration     WFL        ADL's   min A to supervision  mod I  ADL retraining, functional mobility activities, pt education   Mobility   min guard assist basic transfers and gait wearing bil KIs, using RW; supervision w/ c x 150', mod assist for 1 step with RW  modified independent overall including gait x 150' and 3 landing-type separate steps to negotiate with RW, for home  entry  strengthening, gait training, balance, activity tolerance   Communication     [facility]        Safety/Cognition/ Behavioral Observations    NO unsafe behaviors        Pain   Bilateral knee pain 4-7/10 intensity, controlled with oxycodone 10mg  Q4h prn and ice packs   pain controlled less than 4/10; minimal utilization of narcotic pain meds   assess pain periodically and treat accordingly, offer non-phamaceutical pain relief alternatives    Skin   B knee incisions-OTA with steri-strips  skin remain free from breakdown and signs of infection  assess skin q shift and educate of incisional care      *See Care Plan and progress notes for long and short-term goals.  Barriers to Discharge: Decreased mobility    Possible Resolutions to Barriers:  Continue rehabilitation, caregiver education    Discharge Planning/Teaching Needs:  HOme with wife who plans to take off for one week to be with upon discharge      Team Discussion:  Making good progress, needs assist with sequencing.  BM yesterday and feels better.  MD adjusting pain meds. Mod/i level goals   Revisions to Treatment Plan:  None   Continued Need for Acute Rehabilitation Level of Care: The patient requires daily medical management by a physician with specialized training in physical medicine and rehabilitation for the following conditions: Daily direction of a multidisciplinary physical rehabilitation program to ensure safe treatment while eliciting the highest outcome that is of practical value to  the patient.: Yes Daily medical management of patient stability for increased activity during participation in an intensive rehabilitation regime.: Yes Daily analysis of laboratory values and/or radiology reports with any subsequent need for medication adjustment of medical intervention for : Post surgical problems  Shaquetta Arcos, Gardiner Rhyme 02/21/2014, 12:35 PM

## 2014-02-21 NOTE — Progress Notes (Signed)
Occupational Therapy Session Note  Patient Details  Name: James Atkinson MRN: [id number] Date of Birth: 24-Jun-1946  Today's Date: 02/21/2014 OT Individual Time: 1010-1106 OT Individual Time Calculation (min): 56 min   Short Term Goals: No short term goals set  Skilled Therapeutic Interventions/Progress Updates:    Pt seen for BADL retraining of B/D at shower level with a focus on functional mobility and activity tolerance. Pt requested to complete shower transfers from his w/c to shower bench and not walk in to avoid having to don and doff his KI numerous times.  Pt had to sit at a slight angle on bench as he is not able to get a full 90 degrees of knee flexion. Once in the shower, he completed all bathing with mod I.  He transferred to w/c to complete dressing. He is now dressing independently, except for A with TED hose and shoes. Pt had much improved activity tolerance with no c/o nausea. The recreation therapist arrived for the next session.    Therapy Documentation Precautions:  Precautions Precautions: Knee;Fall Required Braces or Orthoses: Knee Immobilizer - Right;Knee Immobilizer - Left Knee Immobilizer - Right: Discontinue once straight leg raise with < 10 degree lag Knee Immobilizer - Left: Discontinue once straight leg raise with < 10 degree lag Restrictions Weight Bearing Restrictions: Yes RLE Weight Bearing: Weight bearing as tolerated LLE Weight Bearing: Weight bearing as tolerated       Pain: Pain Assessment Pain Assessment: No/denies pain ADL: ADL ADL Comments: refer to FIM  See FIM for current functional status  Therapy/Group: Individual Therapy  [place] 02/21/2014, 11:21 AM

## 2014-02-21 NOTE — Progress Notes (Addendum)
Physical Therapy Session Note  Patient Details  Name: James Atkinson MRN: [id number] Date of Birth: 12-04-1945  Today's Date: 02/21/2014 PT Individual Time: 0626-9485; 4627-0350,  0938-1829 PT Individual Time Calculation (min): 60 min , 32 min,   38 min  Short Term Goals: Week 1:  PT Short Term Goal 1 (Week 1): = LTGs due to anticipated short LOS  Skilled Therapeutic Interventions/Progress Updates:  Tx 1: no pain at rest; increased to 5/10 with gait, premedicated.  Bed mobility with extra time.  Tested pt for SLR; pt unable, so KIs still necessary.  Donned bil TEDs, KIs with assistance bedside.  Discussed shoes; pt's present ones are heavy with no tread left.  Bed > w/c stand pivot with RW to L, min guard assist.    Pt demonstrated modified independent w/c use in room to roll to sink, brush teeth from w/c. W/c propulsion on level tile x 150' with supervision.  Gait training with Rw x 90' with min guard> supervision, mod cues for safe use of Rw.  Pt tends to take long R step, short L step, and step past RW.  Therapeutic exercise performed with LE to increase strength for functional mobility: in supine, 10 x 1 R and L hip abd on MaxiTransfer fabric, 10 x 3 bil ankle pumps, 10 x 1 each R and L SLR wearing KIs with assistance, in L sidelyihng, 10 x 1 R  Hip flex/ext.  Gait x returning to room x 75' as above.  With distractions, pt returns to gait tendencies above  NT applied ice to pt's bil knees at end of session.  All needs left within reach.  Tx 2: Pain  bil knees rated 5/10, premedicated ; stiff from sitting up in w/c since last session.  W/c propulsion modified independent x 150'; supervision for brakes and legrest management.  W/c> mat scoot laterally without KIs, min guard assist.  Therapeutic exercise performed with LEs and trunk to increase strength for functional mobility.: R and L forward and sideways kicking in unsupported sitting, PNF diagonals with 4# wt'd bar , 20 x 1  each L and R.  Pt can now be modified independent for w/c mobility on unit. Pt propelled back to room.  W/c> bed scoot laterally as above.  Pt requested ice independently.  Tx 3: Pain rated 4/10 bil knees, premedicated.  Pt rolled w/c to therapy modified independent.  Gait training wearing bil KIs with RW x 90' with min guard/supervision, mod cues for upright posture, forward gaze, = step lengths, and keeping feet within RW. Up/down curb and ramp with min assist, mod cues for technique. Gait returning to room x 75' as above.  Therapeutic exercise performed with LE to increase strength for functional mobility: sitting with KIs donned, 10 x 2 bil hip abd using orange Theraband for resistance.  Therapeutic activity in standing, matching cards by suit,  side stepping L><R x 5' x 7 using sturdy table for bil UE wt bearing.     Therapy Documentation Precautions:  Precautions Precautions: Knee;Fall Required Braces or Orthoses: Knee Immobilizer - Right;Knee Immobilizer - Left Knee Immobilizer - Right: Discontinue once straight leg raise with < 10 degree lag Knee Immobilizer - Left: Discontinue once straight leg raise with < 10 degree lag Restrictions Weight Bearing Restrictions: Yes RLE Weight Bearing: Weight bearing as tolerated LLE Weight Bearing: Weight bearing as tolerated   Pain: Pain Assessment Pain Assessment: No/denies pain (at rest)   Locomotion : Ambulation Ambulation/Gait Assistance: 4: Min assist  See FIM for current functional status  Therapy/Group: Individual Therapy  Jakobe Blau 02/21/2014, 10:58 AM

## 2014-02-21 NOTE — Progress Notes (Signed)
[facility] PHYSICAL MEDICINE & REHABILITATION     PROGRESS NOTE    Subjective/Complaints: BM x 2 yesterday Bladder fine Slept ok Asking for pain med this am  Review of Systems - Negative except B Knee pain  Objective: Vital Signs: Blood pressure 115/70, pulse 70, temperature 98.2 F (36.8 C), temperature source Oral, resp. rate 18, SpO2 97.00%. No results found.  Recent Labs  02/20/14 0527  WBC 5.1  HGB 8.1*  HCT 23.0*  PLT 188    Recent Labs  02/20/14 0527  NA 137  K 4.4  CL 100  GLUCOSE 111*  BUN 20  CREATININE 0.81  CALCIUM 8.8   CBG (last 3)  No results found for this basename: GLUCAP,  in the last 72 hours  Wt Readings from Last 3 Encounters:  02/14/14 70.308 kg (155 lb)  02/14/14 70.308 kg (155 lb)  02/05/14 70.308 kg (155 lb)    Physical Exam:  Constitutional: He is oriented to person, place, and time. He appears well-developed and well-nourished.  HENT: oral mucosa pink and moist  Head: Normocephalic and atraumatic.  Right Ear: External ear normal.  Left Ear: External ear normal.  Eyes: Conjunctivae and EOM are normal. Pupils are equal, round, and reactive to light.  Neck: Normal range of motion. Neck supple.  Cardiovascular: Normal rate, regular rhythm and normal heart sounds. No murmurs or rubs  Respiratory: Effort normal and breath sounds normal. No wheezes or rales  GI: Soft. He exhibits distension.  Musculoskeletal:  Bilateral Knee with decreased edema, PROM to about 60-70 degrees of flexion   Neurological: He is alert and oriented to person, place, and time. UES 5/5. LE: HF 3/5, KE 1/5, ADF/APF 4+. Normal sensory exam. CN exam normal.  Skin: Skin is warm and dry. Wounds are dry/intact/well-approximated Psychiatric: He has a normal mood and affect    Assessment/Plan: 1. Functional deficits secondary to bilateral TKA's due to endstage OA of knees which require 3+ hours per day of interdisciplinary therapy in a comprehensive inpatient  rehab setting. Physiatrist is providing close team supervision and 24 hour management of active medical problems listed below. Physiatrist and rehab team continue to assess barriers to discharge/monitor patient progress toward functional and medical goals. Team conference today please see physician documentation under team conference tab, met with team face-to-face to discuss problems,progress, and goals. Formulized individual treatment plan based on medical history, underlying problem and comorbidities. FIM: FIM - Bathing Bathing Steps Patient Completed: Chest;Right Arm;Left Arm;Abdomen;Left upper leg;Right upper leg;Buttocks;Left lower leg (including foot);Right lower leg (including foot);Front perineal area Bathing: 5: Supervision: Safety issues/verbal cues  FIM - Upper Body Dressing/Undressing Upper body dressing/undressing steps patient completed: Thread/unthread right sleeve of pullover shirt/dresss;Thread/unthread left sleeve of pullover shirt/dress;Put head through opening of pull over shirt/dress;Pull shirt over trunk Upper body dressing/undressing: 5: Set-up assist to: Obtain clothing/put away FIM - Lower Body Dressing/Undressing Lower body dressing/undressing steps patient completed: Thread/unthread right underwear leg;Thread/unthread left underwear leg;Pull underwear up/down;Thread/unthread right pants leg;Thread/unthread left pants leg;Pull pants up/down Lower body dressing/undressing: 4: Min-Patient completed 75 plus % of tasks     FIM - Radio producer Devices: Grab bars;Walker;Bedside commode (bsc over toilet) Toilet Transfers: 4-To toilet/BSC: Min A (steadying Pt. > 75%);4-From toilet/BSC: Min A (steadying Pt. > 75%)  FIM - Control and instrumentation engineer Devices: Walker;Arm rests Bed/Chair Transfer: 5: Sit > Supine: Supervision (verbal cues/safety issues);4: Supine > Sit: Min A (steadying Pt. > 75%/lift 1 leg);4: Chair or W/C >  Bed:  Min A (steadying Pt. > 75%);4: Bed > Chair or W/C: Min A (steadying Pt. > 75%)  FIM - Locomotion: Wheelchair Distance: 150 Locomotion: Wheelchair: 5: Travels 150 ft or more: maneuvers on rugs and over door sills with supervision, cueing or coaxing FIM - Locomotion: Ambulation Locomotion: Ambulation Assistive Devices: Administrator Ambulation/Gait Assistance: 4: Min assist Locomotion: Ambulation: 2: Travels 50 - 149 ft with minimal assistance (Pt.>75%)  Comprehension Comprehension Mode: Auditory Comprehension: 7-Follows complex conversation/direction: With no assist  Expression Expression Mode: Verbal Expression: 7-Expresses complex ideas: With no assist  Social Interaction Social Interaction: 7-Interacts appropriately with others - No medications needed.  Problem Solving Problem Solving: 7-Solves complex problems: Recognizes & self-corrects  Memory Memory: 7-Complete Independence: No helper  Medical Problem List and Plan:  1. Functional deficits secondary to Bilateral Knee DJD.  2. DVT Prophylaxis/Anticoagulation: Pharmaceutical: Coumadin and Lovenox till INR therapeutic  3. Pain Management: Will continue oxycodone prn. Ice in between therapy sessions.  4. Mood: LCSW to follow for evaluation and support.  5. Neuropsych: This patient is capable of making decisions on his own behalf.  6. Skin/Wound Care: Monitor wound daily. Will add nutritional supplement for wound healing.  7. HTN: Will monitor BP every 8 hours. Hold lisinopril for SBP less than 120. Push po fluids.  8. ABLA: Continue iron supplement. hgb up to 8.1  9. Constipation: Will schedule miralax bid and titrate as bowels function start becoming regular LOS (Days) 2 A FACE TO FACE EVALUATION WAS PERFORMED  KIRSTEINS,ANDREW E 02/21/2014 7:12 AM

## 2014-02-21 NOTE — Progress Notes (Signed)
Social Work Patient ID: James Atkinson, male   DOB: 1946-03-21, 68 y.o.   MRN: [id number] Met with pt to discuss team conference goals-mod/i level and discharge 8/25.  He is pleased with his progress and feels if his pain gets more controlled he will be doing better. He will let his wife know so she can plan to be off.  Work toward discharge next [employer].

## 2014-02-21 NOTE — Progress Notes (Addendum)
ANTICOAGULATION CONSULT NOTE - Follow Up Consult  Pharmacy Consult for coumadin Indication: VTE prophylaxis  Allergies  Allergen Reactions  . Ciprofloxacin Nausea Only    Patient Measurements:   Heparin Dosing Weight:   Vital Signs: Temp: 98.2 F (36.8 C) (08/19 0542) Temp src: Oral (08/19 0542) BP: 115/70 mmHg (08/19 0542) Pulse Rate: 70 (08/19 0542)  Labs:  Recent Labs  02/19/14 0435 02/20/14 0527 02/21/14 0548  HGB  --  8.1*  --   HCT  --  23.0*  --   PLT  --  188  --   LABPROT 17.7* 19.4* 22.0*  INR 1.46 1.64* 1.92*  CREATININE  --  0.81  --     The CrCl is unknown because both a height and weight (above a minimum accepted value) are required for this calculation.   Medications:  Scheduled:  . enoxaparin (LOVENOX) injection  30 mg Subcutaneous Q12H  . iron polysaccharides  150 mg Oral BID  . lisinopril  20 mg Oral q morning - 10a  . pantoprazole  40 mg Oral Daily  . polyethylene glycol  17 g Oral BID  . sodium phosphate  1 enema Rectal Once  . Warfarin - Pharmacist Dosing Inpatient   Does not apply q1800   Infusions:    Assessment: 68 yo male s/p TKA is currently on subtherapeutic coumadin. INR today is up to 1.92 today.  Goal of Therapy:  INR 2-3 Monitor platelets by anticoagulation protocol: Yes   Plan:  - coumadin 5mg  po x1 - INR in am - cont lovenox 30mg  sq q12h until INR is >/= 2  Shalandra Leu, [name] 02/21/2014,8:03 AM

## 2014-02-22 ENCOUNTER — Encounter (HOSPITAL_COMMUNITY): Payer: BC Managed Care – PPO | Admitting: Occupational Therapy

## 2014-02-22 ENCOUNTER — Inpatient Hospital Stay (HOSPITAL_COMMUNITY): Payer: BC Managed Care – PPO | Admitting: Occupational Therapy

## 2014-02-22 ENCOUNTER — Encounter (HOSPITAL_COMMUNITY): Payer: Self-pay | Admitting: *Deleted

## 2014-02-22 ENCOUNTER — Inpatient Hospital Stay (HOSPITAL_COMMUNITY): Payer: BC Managed Care – PPO

## 2014-02-22 DIAGNOSIS — M1710 Unilateral primary osteoarthritis, unspecified knee: Secondary | ICD-10-CM

## 2014-02-22 DIAGNOSIS — D62 Acute posthemorrhagic anemia: Secondary | ICD-10-CM

## 2014-02-22 DIAGNOSIS — Z96659 Presence of unspecified artificial knee joint: Secondary | ICD-10-CM

## 2014-02-22 DIAGNOSIS — Z5189 Encounter for other specified aftercare: Secondary | ICD-10-CM

## 2014-02-22 DIAGNOSIS — IMO0002 Reserved for concepts with insufficient information to code with codable children: Secondary | ICD-10-CM

## 2014-02-22 LAB — PROTIME-INR
INR: 1.95 — ABNORMAL HIGH (ref 0.00–1.49)
PROTHROMBIN TIME: 22.2 s — AB (ref 11.6–15.2)

## 2014-02-22 MED ORDER — SENNOSIDES-DOCUSATE SODIUM 8.6-50 MG PO TABS
2.0000 | ORAL_TABLET | Freq: Two times a day (BID) | ORAL | Status: DC
  Administered 2014-02-22 – 2014-02-27 (×10): 2 via ORAL
  Filled 2014-02-22 (×13): qty 2

## 2014-02-22 MED ORDER — WARFARIN SODIUM 6 MG PO TABS
6.0000 mg | ORAL_TABLET | Freq: Once | ORAL | Status: AC
  Administered 2014-02-22: 6 mg via ORAL
  Filled 2014-02-22: qty 1

## 2014-02-22 NOTE — Progress Notes (Signed)
Occupational Therapy Session Note  Patient Details  Name: James Atkinson MRN: [id number] Date of Birth: 11-Sep-1945  Today's Date: 02/22/2014 OT Individual Time: 0905-1005 OT Individual Time Calculation (min): 60 min   Short Term Goals: No short term goals set  Skilled Therapeutic Interventions/Progress Updates:      Pt seen for BADL retraining of toileting, bathing, and dressing with a focus on activity tolerance with knee pain, balance, and safe functional mobility. Pt was received sitting in w/c with knee immobilizers on. Pt walked to shower with RW with supervision only. Assistance provided to [facility] and Ted hose. Pt undressed and bathed and dressed without supervision sitting on tub bench.  Assistance to re don ted hose and KI.  Pt began to feel nauseus after shower and needed rest. Pt provided with ginger ale. Pt ambulated out of shower with RW to put on deoderant and shirt in standing.  Pt ambulated for a short distance in the hallway, but could not continue to not feeling well. Pt spoke with nurse about reducing his pain medication. Pt resting in recliner with all needs met.  Therapy Documentation Precautions:  Precautions Precautions: Knee;Fall Required Braces or Orthoses: Knee Immobilizer - Right;Knee Immobilizer - Left Knee Immobilizer - Right: Discontinue once straight leg raise with < 10 degree lag Knee Immobilizer - Left: Discontinue once straight leg raise with < 10 degree lag Restrictions Weight Bearing Restrictions: Yes RLE Weight Bearing: Weight bearing as tolerated LLE Weight Bearing: Weight bearing as tolerated      Pain: Pain Assessment Pain Assessment: No/denies pain (no pain at rest, level 3 with activity)  ADL: ADL ADL Comments: refer to FIM  See FIM for current functional status  Therapy/Group: Individual Therapy  [place] 02/22/2014, 10:18 AM

## 2014-02-22 NOTE — Progress Notes (Signed)
ANTICOAGULATION CONSULT NOTE - Follow Up Consult  Pharmacy Consult for coumadin Indication: VTE prophylaxis  Allergies  Allergen Reactions  . Ciprofloxacin Nausea Only    Patient Measurements:   Heparin Dosing Weight:   Vital Signs: Temp: 98.2 F (36.8 C) (08/20 0546) Temp src: Oral (08/20 0546) BP: 118/60 mmHg (08/20 0546) Pulse Rate: 64 (08/20 0546)  Labs:  Recent Labs  02/20/14 0527 02/21/14 0548 02/22/14 0616  HGB 8.1*  --   --   HCT 23.0*  --   --   PLT 188  --   --   LABPROT 19.4* 22.0* 22.2*  INR 1.64* 1.92* 1.95*  CREATININE 0.81  --   --     The CrCl is unknown because both a height and weight (above a minimum accepted value) are required for this calculation.   Medications:  Scheduled:  . enoxaparin (LOVENOX) injection  30 mg Subcutaneous Q12H  . iron polysaccharides  150 mg Oral BID  . lisinopril  20 mg Oral q morning - 10a  . pantoprazole  40 mg Oral Daily  . polyethylene glycol  17 g Oral BID  . senna-docusate  2 tablet Oral BID  . sodium phosphate  1 enema Rectal Once  . Warfarin - Pharmacist Dosing Inpatient   Does not apply q1800   Infusions:    Assessment: 68 yo male s/p TKA is currently on subtherapeutic coumadin.  INR didn't move much to 1.95.  Patient is also on lovenox 30mg  sq q12h. Goal of Therapy:  INR 2-3 Monitor platelets by anticoagulation protocol: Yes   Plan:  Warfarin 6 mg PO x 1 today  PT/INR Continue Lovenox until INR is >/= 2   Mande Auvil, [name] 02/22/2014,8:00 AM

## 2014-02-22 NOTE — Progress Notes (Addendum)
Physical Therapy Session Note  Patient Details  Name: James Atkinson MRN: [id number] Date of Birth: Mar 07, 1946  Today's Date: 02/22/2014 PT Individual Time: 0803-0903 PT Individual Time Calculation (min): 60 min   Short Term Goals: Week 1:  PT Short Term Goal 1 (Week 1): = LTGs due to anticipated short LOS  Skilled Therapeutic Interventions/Progress Updates:  Tested straight leg raises bil; pt continues to have great difficulty with this; still requires KIs.  Bedside therapeutic exercise performed with bil LEs to increase strength for functional mobility: sidelying R and L hip flex/ext, active assistive hip abd, 10 x 1 each; supine bil bridging, isometric knee ext, active assistive short arc quad ext 10 x 1 each; standing calf raises with bil UE support, 10 x 1.  Bed mobility modified independent.  Donned KIs with assistance.  Sit> stand from bed with supervision.  Gait in room to sink for oral hygiene, supervision with RW. Distant supervision for standing at sink for oral care.  Gait on level tile x 150' inclduding backwards x 25' with RW, supervision, with rare cue for =step lengths and feet staying within RW.  Gait more fluid today, with pt rolling RW smoothly while stepping. Gait x 50' as above.  W/c propulsion to gym modified independent.  Issued HEP of TKR exs.  Pt needs to practice gait on carpet per home situation.    Therapy Documentation Precautions:  Precautions Precautions: Knee;Fall Required Braces or Orthoses: Knee Immobilizer - Right;Knee Immobilizer - Left Knee Immobilizer - Right: Discontinue once straight leg raise with < 10 degree lag Knee Immobilizer - Left: Discontinue once straight leg raise with < 10 degree lag Restrictions Weight Bearing Restrictions: Yes RLE Weight Bearing: Weight bearing as tolerated LLE Weight Bearing: Weight bearing as tolerated   Locomotion : Ambulation Ambulation/Gait Assistance: 5: Supervision Wheelchair Mobility Distance: 150      See FIM for current functional status  Therapy/Group: Individual Therapy  [name] 02/22/2014, 3:46 PM

## 2014-02-22 NOTE — IPOC Note (Signed)
Overall Plan of Care [facility]) Patient Details Name: James Atkinson MRN: [id number] DOB: 12/24/1945  Admitting Diagnosis: B TKR  Hospital Problems: Active Problems:   OA (osteoarthritis) of knee     Functional Problem List: Nursing Bowel;Endurance;Pain;Skin Integrity  PT Balance;Endurance;Edema;Pain;Safety  OT Balance;Endurance;Pain  SLP    TR         Basic ADL's: OT Bathing;Dressing;Toileting     Advanced  ADL's: OT Simple Meal Preparation     Transfers: PT Bed to Chair;Bed Mobility;Car;Furniture  OT Toilet;Tub/Shower     Locomotion: PT Ambulation;Wheelchair Mobility;Stairs     Additional Impairments: OT None  SLP        TR      Anticipated Outcomes Item Anticipated Outcome  Self Feeding I  Swallowing      Basic self-care  mod I  Toileting  mod I   Bathroom Transfers mod I  Bowel/Bladder  mod I  Transfers  mod I  Locomotion  mod I  Communication     Cognition     Pain  < 4  Safety/Judgment  mod I   Therapy Plan: PT Intensity: Minimum of 1-2 x/day ,45 to 90 minutes PT Frequency: 5 out of 7 days PT Duration Estimated Length of Stay: 5-7 days OT Intensity: Minimum of 1-2 x/day, 45 to 90 minutes OT Frequency: 5 out of 7 days OT Duration/Estimated Length of Stay: 5-7 days         Team Interventions: Nursing Interventions Bowel Management;Skin Care/Wound Management;Pain Management;Medication Management;Patient/Family Education  PT interventions Ambulation/gait training;Balance/vestibular training;Community reintegration;Discharge planning;DME/adaptive equipment instruction;Functional mobility training;Neuromuscular re-education;Pain management;Patient/family education;Psychosocial support;Stair training;Therapeutic Activities;Therapeutic Exercise;UE/LE Strength taining/ROM;UE/LE Coordination activities;Wheelchair propulsion/positioning  OT Interventions Publishing copy;Functional  mobility training;Patient/family education;Self Care/advanced ADL retraining;Therapeutic Activities;Therapeutic Exercise;UE/LE Strength taining/ROM;UE/LE Coordination activities  SLP Interventions    TR Interventions    SW/CM Interventions Discharge Planning;Psychosocial Support;Patient/Family Education    Team Discharge Planning: Destination: PT-Home ,OT- Home , SLP-  Projected Follow-up: PT-Outpatient PT, OT-  None, SLP-  Projected Equipment Needs: PT-Rolling walker with 5" wheels, OT-  , SLP-  Equipment Details: PT-pt has tub bench, OT-pt has tub bench Patient/family involved in discharge planning: PT- Patient,  OT-Patient, SLP-   MD ELOS: 7-8d Medical Rehab Prognosis:  Excellent Assessment: 68 y.o. male with history of HTN, GERD, dysphagia s/p dilatation, DJD/DDD with endstage changes bilateral knees and failure of conservative therapy. He elected to undergo B-TKR on 02/14/14 by Dr. Chanetta Marshall. Post op is WBAT and on coumadin for DVT prophylaxis   Now requiring 24/7 Rehab RN,MD, as well as CIR level PT, OT and SLP.  Treatment team will focus on ADLs and mobility with goals set at Mod I   See Team Conference Notes for weekly updates to the plan of care

## 2014-02-22 NOTE — Progress Notes (Signed)
[place] PHYSICAL MEDICINE & REHABILITATION     PROGRESS NOTE    Subjective/Complaints:  Asking for pain med prior to PT, constipated  Review of Systems - Negative except B Knee pain  Objective: Vital Signs: Blood pressure 118/60, pulse 64, temperature 98.2 F (36.8 C), temperature source Oral, resp. rate 16, SpO2 96.00%. No results found.  Recent Labs  02/20/14 0527  WBC 5.1  HGB 8.1*  HCT 23.0*  PLT 188    Recent Labs  02/20/14 0527  NA 137  K 4.4  CL 100  GLUCOSE 111*  BUN 20  CREATININE 0.81  CALCIUM 8.8   CBG (last 3)  No results found for this basename: GLUCAP,  in the last 72 hours  Wt Readings from Last 3 Encounters:  02/14/14 70.308 kg (155 lb)  02/14/14 70.308 kg (155 lb)  02/05/14 70.308 kg (155 lb)    Physical Exam:  Constitutional: He is oriented to person, place, and time. He appears well-developed and well-nourished.  HENT: oral mucosa pink and moist  Head: Normocephalic and atraumatic.  Right Ear: External ear normal.  Left Ear: External ear normal.  Eyes: Conjunctivae and EOM are normal. Pupils are equal, round, and reactive to light.  Neck: Normal range of motion. Neck supple.  Cardiovascular: Normal rate, regular rhythm and normal heart sounds. No murmurs or rubs  Respiratory: Effort normal and breath sounds normal. No wheezes or rales  GI: Soft. He exhibits distension.  Musculoskeletal:  Bilateral Knee with decreased edema, PROM to about 60-70 degrees of flexion  unable to perform SLR Neurological: He is alert and oriented to person, place, and time. UES 5/5. LE: HF 3/5, KE 1/5, ADF/APF 4+. Normal sensory exam. CN exam normal.  Skin: Skin is warm and dry. Wounds are dry/intact/well-approximated Psychiatric: He has a normal mood and affect    Assessment/Plan: 1. Functional deficits secondary to bilateral TKA's due to endstage OA of knees which require 3+ hours per day of interdisciplinary therapy in a comprehensive inpatient  rehab setting. Physiatrist is providing close team supervision and 24 hour management of active medical problems listed below. Physiatrist and rehab team continue to assess barriers to discharge/monitor patient progress toward functional and medical goals.  FIM: FIM - Bathing Bathing Steps Patient Completed: Chest;Right Arm;Left Arm;Abdomen;Left upper leg;Right upper leg;Buttocks;Left lower leg (including foot);Right lower leg (including foot);Front perineal area Bathing: 6: More than reasonable amount of time  FIM - Upper Body Dressing/Undressing Upper body dressing/undressing steps patient completed: Thread/unthread right sleeve of pullover shirt/dresss;Thread/unthread left sleeve of pullover shirt/dress;Put head through opening of pull over shirt/dress;Pull shirt over trunk Upper body dressing/undressing: 5: Set-up assist to: Obtain clothing/put away FIM - Lower Body Dressing/Undressing Lower body dressing/undressing steps patient completed: Thread/unthread right underwear leg;Thread/unthread left underwear leg;Pull underwear up/down;Thread/unthread right pants leg;Thread/unthread left pants leg;Pull pants up/down;Fasten/unfasten left shoe;Fasten/unfasten right shoe Lower body dressing/undressing: 4: Min-Patient completed 75 plus % of tasks  FIM - Toileting Toileting: 0: Activity did not occur  FIM - Radio producer Devices: Grab bars;Walker;Bedside commode (bsc over toilet) Toilet Transfers: 4-To toilet/BSC: Min A (steadying Pt. > 75%);4-From toilet/BSC: Min A (steadying Pt. > 75%)  FIM - Bed/Chair Transfer Bed/Chair Transfer Assistive Devices: Arm rests;Bed rails Bed/Chair Transfer: 6: Supine > Sit: No assist;4: Chair or W/C > Bed: Min A (steadying Pt. > 75%);4: Bed > Chair or W/C: Min A (steadying Pt. > 75%)  FIM - Locomotion: Wheelchair Distance: 150 Locomotion: Wheelchair: 5: Travels 150 ft or more: maneuvers  on rugs and over door sills with  supervision, cueing or coaxing FIM - Locomotion: Ambulation Locomotion: Ambulation Assistive Devices: Walker - Rolling Ambulation/Gait Assistance: 4: Min assist Locomotion: Ambulation: 2: Travels 50 - 149 ft with minimal assistance (Pt.>75%)  Comprehension Comprehension Mode: Auditory Comprehension: 7-Follows complex conversation/direction: With no assist  Expression Expression Mode: Verbal Expression: 7-Expresses complex ideas: With no assist  Social Interaction Social Interaction: 7-Interacts appropriately with others - No medications needed.  Problem Solving Problem Solving: 7-Solves complex problems: Recognizes & self-corrects  Memory Memory: 7-Complete Independence: No helper  Medical Problem List and Plan:  1. Functional deficits secondary to Bilateral Knee DJD.  2. DVT Prophylaxis/Anticoagulation: Pharmaceutical: Coumadin and Lovenox till INR therapeutic  3. Pain Management: Will continue oxycodone prn. Ice in between therapy sessions.  4. Mood: LCSW to follow for evaluation and support.  5. Neuropsych: This patient is capable of making decisions on his own behalf.  6. Skin/Wound Care: Monitor wound daily. Will add nutritional supplement for wound healing.  7. HTN: Will monitor BP every 8 hours. Hold lisinopril for SBP less than 120. Push po fluids.  8. ABLA: Continue iron supplement. hgb up to 8.1  9. Constipation: Will schedule miralax bid, add senna and titrate as bowels function start becoming regular LOS (Days) 3 A FACE TO FACE EVALUATION WAS PERFORMED  Adaisha Campise E 02/22/2014 6:51 AM

## 2014-02-22 NOTE — Progress Notes (Signed)
Occupational Therapy Session Note  Patient Details  Name: James Atkinson MRN: [id number] Date of Birth: 04-05-1946  Today's Date: 02/22/2014 OT Individual Time: 1400-1500 OT Individual Time Calculation (min): 60 min   Short Term Goals: Week 1:  OT Short Term Goal 1 (Week 1): STGS = LTGS  Skilled Therapeutic Interventions/Progress Updates:  Patient received supine in bed with no complaints of pain or nausea. Therapist donned bilateral knee immobilizers, patient donned bilateral shoes with set-up assist, then transferred > EOB. Patient stood with RW and ambulated> therapy gym. Once in gym, patient transferred onto therapy mat and therapist introduced leg lifter. Patient performed BLE hip flexor exercises using leg lifer (3 sets of 10 reps), then stood for abduction exercises and leg extensor exercises. Patient sat for seated rest break, then ambulated > ADL apartment. Patient engaged in furniture transfer, then worked on Editor, commissioning. Therapist administered walker bag. Patient distant supervision for kitchen task using RW and walker bag. Patient ambulated back to room and left seated in recliner with all needs within reach.   Precautions:  Precautions Precautions: Knee;Fall Required Braces or Orthoses: Knee Immobilizer - Right;Knee Immobilizer - Left Knee Immobilizer - Right: Discontinue once straight leg raise with < 10 degree lag Knee Immobilizer - Left: Discontinue once straight leg raise with < 10 degree lag Restrictions Weight Bearing Restrictions: Yes RLE Weight Bearing: Weight bearing as tolerated LLE Weight Bearing: Weight bearing as tolerated  See FIM for current functional status  Therapy/Group: Individual Therapy  Cliford Sequeira 02/22/2014, 3:25 PM

## 2014-02-22 NOTE — Progress Notes (Signed)
Recreational Therapy Session Note  Patient Details  Name: James Atkinson MRN: [id number] Date of Birth: 03/07/1946 Today's Date: 02/22/2014  Met with pt and discussed TR services with focus on education on remaining active during continued recovery & community reintegration.  No further TR services needed.    SIMPSON,LISA 02/22/2014, 8:00 AM  

## 2014-02-23 ENCOUNTER — Inpatient Hospital Stay (HOSPITAL_COMMUNITY): Payer: BC Managed Care – PPO | Admitting: Physical Therapy

## 2014-02-23 ENCOUNTER — Inpatient Hospital Stay (HOSPITAL_COMMUNITY): Payer: BC Managed Care – PPO

## 2014-02-23 ENCOUNTER — Encounter (HOSPITAL_COMMUNITY): Payer: BC Managed Care – PPO | Admitting: Occupational Therapy

## 2014-02-23 ENCOUNTER — Inpatient Hospital Stay (HOSPITAL_COMMUNITY): Payer: BC Managed Care – PPO | Admitting: *Deleted

## 2014-02-23 LAB — PROTIME-INR
INR: 1.91 — ABNORMAL HIGH (ref 0.00–1.49)
PROTHROMBIN TIME: 21.9 s — AB (ref 11.6–15.2)

## 2014-02-23 MED ORDER — WARFARIN SODIUM 6 MG PO TABS
6.0000 mg | ORAL_TABLET | Freq: Once | ORAL | Status: AC
  Administered 2014-02-23: 6 mg via ORAL
  Filled 2014-02-23: qty 1

## 2014-02-23 NOTE — Progress Notes (Signed)
Occupational Therapy Session Note  Patient Details  Name: James Atkinson MRN: [id number] Date of Birth: [birth year]  Today's Date: 02/23/2014 OT Individual Time: 1005-1045 and 1110-1135 OT Individual Time Calculation (min): 40 min and 25 min   Short Term Goals: No short term goals set  Skilled Therapeutic Interventions/Progress Updates:    Visit 1: Pt seen for BADL retraining of bathing and dressing with a focus on functional mobility. Pt in bed with L KI on. Pt ambulated to shower with S with RW. Pt doffed KI and clothing, showered, and donned all clothing and KI independently after set up. His wife arrived for session and observed pt walking with S. If she is in the room, she may walk him to the bathroom.  Pt got into the bed to doff KI to don socks. Pt began feeling very fatigued and woozy from the pain medication.  Pt given option to rest for 20 minutes and then his OT session could continue.  Visit 2: OT session continued after patient rest. Pt donned sneakers and KI. Pt ambulated for 10 minutes in the hallway practicing turning corners.  Pt practiced tub bench transfers in tub room with RW with distant S.  Pt then sat EOB to work on UE AROM exercises to use at home to maintain shoulder strength. Pt ambulated back to room with S and got into bed. Pt with all needs met.  Therapy Documentation Precautions:  Precautions Precautions: Knee;Fall Required Braces or Orthoses: Knee Immobilizer - Right;Knee Immobilizer - Left Knee Immobilizer - Right: Discontinue once straight leg raise with < 10 degree lag Knee Immobilizer - Left: Discontinue once straight leg raise with < 10 degree lag Restrictions Weight Bearing Restrictions: Yes RLE Weight Bearing: Weight bearing as tolerated LLE Weight Bearing: Weight bearing as tolerated    Vital Signs: Therapy Vitals Temp: 98.1 F (36.7 C) Temp src: Oral Pulse Rate: 67 Resp: 19 BP: 117/57 mmHg Patient Position (if appropriate): Lying Oxygen  Therapy SpO2: 95 % O2 Device: None (Room air) Pain: Pain Assessment Pain Score: 2  ADL: ADL ADL Comments: refer to FIM  See FIM for current functional status  Therapy/Group: Individual Therapy  SAGUIER,JULIA 02/23/2014, 9:32 AM

## 2014-02-23 NOTE — Progress Notes (Signed)
[place] PHYSICAL MEDICINE & REHABILITATION     PROGRESS NOTE    Subjective/Complaints: Is my blood ok? Had several BMs yest, drinking prune juice, senna added  Review of Systems - Negative except B Knee pain  Objective: Vital Signs: Blood pressure 117/57, pulse 67, temperature 98.1 F (36.7 C), temperature source Oral, resp. rate 19, SpO2 95.00%. No results found. No results found for this basename: WBC, HGB, HCT, PLT,  in the last 72 hours No results found for this basename: NA, K, CL, CO, GLUCOSE, BUN, CREATININE, CALCIUM,  in the last 72 hours CBG (last 3)  No results found for this basename: GLUCAP,  in the last 72 hours  Wt Readings from Last 3 Encounters:  02/14/14 70.308 kg (155 lb)  02/14/14 70.308 kg (155 lb)  02/05/14 70.308 kg (155 lb)    Physical Exam:  Constitutional: He is oriented to person, place, and time. He appears well-developed and well-nourished.  HENT: oral mucosa pink and moist  Head: Normocephalic and atraumatic.  Right Ear: External ear normal.  Left Ear: External ear normal.  Eyes: Conjunctivae and EOM are normal. Pupils are equal, round, and reactive to light.  Neck: Normal range of motion. Neck supple.  Cardiovascular: Normal rate, regular rhythm and normal heart sounds. No murmurs or rubs  Respiratory: Effort normal and breath sounds normal. No wheezes or rales  GI: Soft. He exhibits distension.  Musculoskeletal:  Bilateral Knee with minimal edema, PROM to about 60-70 degrees of flexion  unable to perform SLR Neurological: He is alert and oriented to person, place, and time. UES 5/5. LE: HF 3/5, KE 1/5, ADF/APF 4+. Normal sensory exam. CN exam normal.  Skin: Skin is warm and dry. Wounds are dry/intact/well-approximated Psychiatric: He has a normal mood and affect    Assessment/Plan: 1. Functional deficits secondary to bilateral TKA's due to endstage OA of knees which require 3+ hours per day of interdisciplinary therapy in a  comprehensive inpatient rehab setting. Physiatrist is providing close team supervision and 24 hour management of active medical problems listed below. Physiatrist and rehab team continue to assess barriers to discharge/monitor patient progress toward functional and medical goals.  FIM: FIM - Bathing Bathing Steps Patient Completed: Chest;Right Arm;Left Arm;Abdomen;Left upper leg;Right upper leg;Buttocks;Left lower leg (including foot);Right lower leg (including foot);Front perineal area Bathing: 6: More than reasonable amount of time  FIM - Upper Body Dressing/Undressing Upper body dressing/undressing steps patient completed: Thread/unthread right sleeve of pullover shirt/dresss;Thread/unthread left sleeve of pullover shirt/dress;Put head through opening of pull over shirt/dress;Pull shirt over trunk Upper body dressing/undressing: 5: Set-up assist to: Obtain clothing/put away FIM - Lower Body Dressing/Undressing Lower body dressing/undressing steps patient completed: Thread/unthread right underwear leg;Thread/unthread left underwear leg;Pull underwear up/down;Thread/unthread right pants leg;Thread/unthread left pants leg;Pull pants up/down;Fasten/unfasten left shoe;Fasten/unfasten right shoe Lower body dressing/undressing: 4: Min-Patient completed 75 plus % of tasks  FIM - Toileting Toileting steps completed by patient: Performs perineal hygiene Toileting: 0: Activity did not occur  FIM - Radio producer Devices: Grab bars;Walker;Bedside commode (bsc over toilet) Toilet Transfers: 4-To toilet/BSC: Min A (steadying Pt. > 75%);4-From toilet/BSC: Min A (steadying Pt. > 75%)  FIM - Bed/Chair Transfer Bed/Chair Transfer Assistive Devices: Arm rests;Bed rails Bed/Chair Transfer: 6: Supine > Sit: No assist;5: Bed > Chair or W/C: Supervision (verbal cues/safety issues);5: Chair or W/C > Bed: Supervision (verbal cues/safety issues)  FIM - Locomotion:  Wheelchair Distance: 150 Locomotion: Wheelchair: 6: Travels 150 ft or more, turns around, maneuvers to table,  bed or toilet, negotiates 3% grade: maneuvers on rugs and over door sills independently FIM - Locomotion: Ambulation Locomotion: Ambulation Assistive Devices: Walker - Rolling Ambulation/Gait Assistance: 5: Supervision Locomotion: Ambulation: 5: Travels 150 ft or more with supervision/safety issues  Comprehension Comprehension Mode: Auditory Comprehension: 7-Follows complex conversation/direction: With no assist  Expression Expression Mode: Verbal Expression: 7-Expresses complex ideas: With no assist  Social Interaction Social Interaction: 7-Interacts appropriately with others - No medications needed.  Problem Solving Problem Solving: 7-Solves complex problems: Recognizes & self-corrects  Memory Memory: 7-Complete Independence: No helper  Medical Problem List and Plan:  1. Functional deficits secondary to Bilateral Knee DJD.  2. DVT Prophylaxis/Anticoagulation: Pharmaceutical: Coumadin and Lovenox till INR therapeutic  3. Pain Management: Will continue oxycodone prn. Ice in between therapy sessions.  4. Mood: LCSW to follow for evaluation and support.  5. Neuropsych: This patient is capable of making decisions on his own behalf.  6. Skin/Wound Care: Monitor wound daily. Will add nutritional supplement for wound healing.  7. HTN: Will monitor BP every 8 hours. Hold lisinopril for SBP less than 120. Push po fluids.  8. ABLA: Continue iron supplement. hgb up to 8.1  9. Constipation: Will  miralax bid,senna , titrate as bowels function start becoming regular LOS (Days) 4 A FACE TO FACE EVALUATION WAS PERFORMED  Charlett Blake 02/23/2014 6:31 AM

## 2014-02-23 NOTE — Progress Notes (Signed)
Physical Therapy Session Note  Patient Details  Name: James Atkinson MRN: [id number] Date of Birth: 04/19/1946  Today's Date: 02/23/2014 PT Individual Time: 1405-1430 PT Individual Time Calculation (min): 25 min   Short Term Goals: Week 1:  PT Short Term Goal 1 (Week 1): = LTGs due to anticipated short LOS  Skilled Therapeutic Interventions/Progress Updates:   Pt received in bed; assisted with donning of L KI.  Pt transferred supine > sit supervision and donned grip socks EOB supervision.  Performed sit > stand supervision but with full use of UE to swing LE to stand with no flexion of RLE.  Performed gait x 100' +50' with pt initially ambulating with bilat LE in full extension; pt cued and given demonstration of normal gait sequencing with RLE performing heel off >> hip and knee flexion at swing phase for clearance and advancement >> knee extension at heel strike.  Pt able to perform during second repetition with supervision and decreased dependence on UE on RW; attempted to ambulate 2-3' without UE support on RW but felt too unstable.  Continued gait training with one step/curb negotiation training with therapist demonstrating safe sequence with RW forwards and backwards.  Pt gave repeat demonstration of both sequences x 6 reps with verbal cues needed for safe step to sequence and cues to control R knee eccentric flexion during descent.  Pt perform self PROM to R knee for increased flexion by planting R foot and then scooting forwards in chair x 3 reps with 30 second hold of stretch.  Pt handed off to OT at end of session.    Therapy Documentation Precautions:  Precautions Precautions: Knee;Fall Required Braces or Orthoses: Knee Immobilizer - Right;Knee Immobilizer - Left Knee Immobilizer - Right: Discontinue once straight leg raise with < 10 degree lag Knee Immobilizer - Left: Discontinue once straight leg raise with < 10 degree lag Restrictions Weight Bearing Restrictions: Yes RLE Weight  Bearing: Weight bearing as tolerated LLE Weight Bearing: Weight bearing as tolerated Vital Signs: Therapy Vitals Temp: 98.4 F (36.9 C) Temp src: Oral Pulse Rate: 66 Resp: 22 BP: 112/63 mmHg Patient Position (if appropriate): Lying Oxygen Therapy SpO2: 98 % O2 Device: None (Room air) Pain: Pain Assessment Pain Assessment: No/denies pain Pain Score: 2  Locomotion : Ambulation Ambulation/Gait Assistance: 5: Supervision   See FIM for current functional status  Therapy/Group: Individual Therapy  Raylene Everts [facility] 02/23/2014, 5:15 PM

## 2014-02-23 NOTE — Progress Notes (Signed)
ANTICOAGULATION CONSULT NOTE - Follow Up Consult  Pharmacy Consult for coumadin Indication: VTE prophylaxis  Allergies  Allergen Reactions  . Ciprofloxacin Nausea Only    Patient Measurements:   Heparin Dosing Weight:   Vital Signs: Temp: 98.1 F (36.7 C) (08/21 0533) Temp src: Oral (08/21 0533) BP: 117/57 mmHg (08/21 0533) Pulse Rate: 67 (08/21 0533)  Labs:  Recent Labs  02/21/14 0548 02/22/14 0616 02/23/14 0620  LABPROT 22.0* 22.2* 21.9*  INR 1.92* 1.95* 1.91*    The CrCl is unknown because both a height and weight (above a minimum accepted value) are required for this calculation.   Medications:  Scheduled:  . enoxaparin (LOVENOX) injection  30 mg Subcutaneous Q12H  . iron polysaccharides  150 mg Oral BID  . lisinopril  20 mg Oral q morning - 10a  . pantoprazole  40 mg Oral Daily  . polyethylene glycol  17 g Oral BID  . senna-docusate  2 tablet Oral BID  . sodium phosphate  1 enema Rectal Once  . Warfarin - Pharmacist Dosing Inpatient   Does not apply q1800   Infusions:    Assessment: 68 yo male s/p TKA is currently on subtherapeutic coumadin.  INR today is 1.91.  Dose missed on 08/16. Goal of Therapy:  INR 2-3 Monitor platelets by anticoagulation protocol: Yes   Plan:  Warfarin 6 mg PO x 1 today  PT/INR Continue Lovenox until INR is >/= 2   Salvador Coupe, [name] 02/23/2014,8:00 AM

## 2014-02-23 NOTE — Progress Notes (Signed)
Occupational Therapy Session Note  Patient Details  Name: James Atkinson MRN: [id number] Date of Birth: Dec 02, 1945  Today's Date: 02/23/2014 OT Individual Time: 7793-9030 OT Individual Time Calculation (min): 30 min   Short Term Goals: Week 1:  OT Short Term Goal 1 (Week 1): STGS = LTGS  Skilled Therapeutic Interventions/Progress Updates:    Pt seen for 1:1 OT session with focus on activity tolerance, functional transfers, functional mobility, and bed mobility. Pt received following PT session. Ambulated to ADL apartment at supervision level. Completed sit<>supine on actual bed and tub transfer with TTB at supervision level. Ambulated to family room ~100 feet with 1 rest break. Completed furniture transfer to low couch at supervision level with min cues for scooting to edge of couch for sit>stand. Ambulated back to room with increased time and no rest break. Pt left sitting on toilet and RN notified.   Therapy Documentation Precautions:  Precautions Precautions: Knee;Fall Required Braces or Orthoses: Knee Immobilizer - Right;Knee Immobilizer - Left Knee Immobilizer - Right: Discontinue once straight leg raise with < 10 degree lag Knee Immobilizer - Left: Discontinue once straight leg raise with < 10 degree lag Restrictions Weight Bearing Restrictions: Yes RLE Weight Bearing: Weight bearing as tolerated LLE Weight Bearing: Weight bearing as tolerated General:   Vital Signs:   Pain: Pain Assessment Pain Assessment: 0-10 Pain Score: 2  Pain Type: Surgical pain Pain Location: Knee Pain Orientation: Right;Left Pain Descriptors / Indicators: Aching Pain Onset: Gradual Patients Stated Pain Goal: 2 Pain Intervention(s): Medication (See eMAR);Repositioned;Cold applied Multiple Pain Sites: No  See FIM for current functional status  Therapy/Group: Individual Therapy  Duayne Cal 02/23/2014, 3:21 PM

## 2014-02-23 NOTE — Progress Notes (Signed)
Physical Therapy Session Note  Patient Details  Name: James Atkinson MRN: [id number] Date of Birth: Jan 07, 1946  Today's Date: 02/23/2014 PT Individual Time:  8:00-9:00 (53min)    Short Term Goals: Week 1:  PT Short Term Goal 1 (Week 1): = LTGs due to anticipated short LOS  Skilled Therapeutic Interventions/Progress Updates:  Tx focused on gait training with RW, functional mobility training, and therex for bil LE strengthening and ROM. Pt c/o new L knee pain with ext 5/10, but able to participate with rest breaks for relief. Pt continues to have trouble with TKE, which was a premorbid problem for many years. Pt was able to perform R SLR after warm-up with leg lifter on, but LLE still difficult and will need L KI for now.  Locked out bed for knee ext and educated pt on positioning for optimal ROM.   Pt resting in bed, needing to use bathroom. Performed bed mobility with S, using UEs for assist. Bed mobility on mat as well with cues for efficient technique.   Performed static standing balance with 1/2UE assist 3x91min with S for safety and cues for knee ext, but not LOB.   Gait in controlled setting 2x120' with RW and S cues for posture, and RLE gait cycle flexion and ext. L KI donned.   Pt performed each of the following 2x10 with cues for technique for increased strength and ROM:  Supine ankle pumps, quad sets, glute sets, ADD squeeze, SAQ, hip ABD, and SLR with assist prn.  Seated HS curls, LAQ, and marching. Standing knee bends, marching and heel raises.  ROM: R flexion 68 degrees, ext -7 L flexion 75 degrees, ext 10   Pt left up in recliner with NT and all needs in reach. Updated safety sheet for only L KI needed       Therapy Documentation Precautions:  Precautions Precautions: Knee;Fall Required Braces or Orthoses: Knee Immobilizer - Right;Knee Immobilizer - Left Knee Immobilizer - Right: Discontinue once straight leg raise with < 10 degree lag Knee Immobilizer - Left:  Discontinue once straight leg raise with < 10 degree lag Restrictions Weight Bearing Restrictions: Yes RLE Weight Bearing: Weight bearing as tolerated LLE Weight Bearing: Weight bearing as tolerated General:   Vital Signs: Therapy Vitals Temp: 98.1 F (36.7 C) Temp src: Oral Pulse Rate: 67 Resp: 19 BP: 117/57 mmHg Patient Position (if appropriate): Lying Oxygen Therapy SpO2: 95 % O2 Device: None (Room air)  See FIM for current functional status  Therapy/Group: Individual Therapy Kennieth Rad, PT, DPT   02/23/2014, 8:20 AM

## 2014-02-24 ENCOUNTER — Inpatient Hospital Stay (HOSPITAL_COMMUNITY): Payer: BC Managed Care – PPO | Admitting: *Deleted

## 2014-02-24 LAB — PROTIME-INR
INR: 2.04 — ABNORMAL HIGH (ref 0.00–1.49)
Prothrombin Time: 23 seconds — ABNORMAL HIGH (ref 11.6–15.2)

## 2014-02-24 MED ORDER — WARFARIN SODIUM 6 MG PO TABS
6.0000 mg | ORAL_TABLET | Freq: Once | ORAL | Status: AC
  Administered 2014-02-24: 6 mg via ORAL
  Filled 2014-02-24: qty 1

## 2014-02-24 NOTE — Progress Notes (Signed)
ANTICOAGULATION CONSULT NOTE - Follow Up Consult  Pharmacy Consult for coumadin Indication: VTE prophylaxis  Allergies  Allergen Reactions  . Ciprofloxacin Nausea Only    Patient Measurements:   Heparin Dosing Weight:   Vital Signs: Temp: 98.8 F (37.1 C) (08/22 0633) Temp src: Oral (08/22 0633) BP: 120/51 mmHg (08/22 0633) Pulse Rate: 64 (08/22 0633)  Labs:  Recent Labs  02/22/14 0616 02/23/14 0620 02/24/14 0819  LABPROT 22.2* 21.9* 23.0*  INR 1.95* 1.91* 2.04*    The CrCl is unknown because both a height and weight (above a minimum accepted value) are required for this calculation.   Medications:  Scheduled:  . enoxaparin (LOVENOX) injection  30 mg Subcutaneous Q12H  . iron polysaccharides  150 mg Oral BID  . lisinopril  20 mg Oral q morning - 10a  . pantoprazole  40 mg Oral Daily  . polyethylene glycol  17 g Oral BID  . senna-docusate  2 tablet Oral BID  . sodium phosphate  1 enema Rectal Once  . Warfarin - Pharmacist Dosing Inpatient   Does not apply q1800   Infusions:    Assessment: 68 yo male s/p TKA receiving anticoagulation with Coumadin.  INR today is up to 2.04 after dose increase on 8/20.  He is also on Lovenox until his INR is >2.  Goal of Therapy:  INR 2-3 Monitor platelets by anticoagulation protocol: Yes   Plan:  Warfarin 6 mg PO x 1 today  PT/INR Discontinue Lovenox today.   James Atkinson 02/24/2014,9:53 AM

## 2014-02-24 NOTE — Progress Notes (Signed)
[facility] PHYSICAL MEDICINE & REHABILITATION     PROGRESS NOTE    Subjective/Complaints: Is my blood ok? Had several BMs yest, drinking prune juice, senna added  Review of Systems - Negative except B Knee pain  Objective: Vital Signs: Blood pressure 120/51, pulse 64, temperature 98.8 F (37.1 C), temperature source Oral, resp. rate 17, SpO2 95.00%. No results found. No results found for this basename: WBC, HGB, HCT, PLT,  in the last 72 hours No results found for this basename: NA, K, CL, CO, GLUCOSE, BUN, CREATININE, CALCIUM,  in the last 72 hours CBG (last 3)  No results found for this basename: GLUCAP,  in the last 72 hours  Wt Readings from Last 3 Encounters:  02/14/14 70.308 kg (155 lb)  02/14/14 70.308 kg (155 lb)  02/05/14 70.308 kg (155 lb)    Physical Exam:  Constitutional: He is oriented to person, place, and time. He appears well-developed and well-nourished.  HENT: oral mucosa pink and moist  Head: Normocephalic and atraumatic.  Right Ear: External ear normal.  Left Ear: External ear normal.  Eyes: Conjunctivae and EOM are normal. Pupils are equal, round, and reactive to light.  Neck: Normal range of motion. Neck supple.  Cardiovascular: Normal rate, regular rhythm and normal heart sounds. No murmurs or rubs  Respiratory: Effort normal and breath sounds normal. No wheezes or rales  GI: Soft. He exhibits distension.  Musculoskeletal:  Bilateral Knee with minimal edema, PROM to about 60-70 degrees of flexion, min knee effusion  unable to perform SLR Neurological: He is alert and oriented to person, place, and time. UES 5/5. LE: HF 3/5, KE 2-/5, ADF/APF 4+. Normal sensory exam. CN exam normal.  Skin: Skin is warm and dry. Wounds are dry/intact/well-approximated Psychiatric: He has a normal mood and affect    Assessment/Plan: 1. Functional deficits secondary to bilateral TKA's due to endstage OA of knees which require 3+ hours per day of interdisciplinary  therapy in a comprehensive inpatient rehab setting. Physiatrist is providing close team supervision and 24 hour management of active medical problems listed below. Physiatrist and rehab team continue to assess barriers to discharge/monitor patient progress toward functional and medical goals.  FIM: FIM - Bathing Bathing Steps Patient Completed: Chest;Right Arm;Left Arm;Abdomen;Left upper leg;Right upper leg;Buttocks;Left lower leg (including foot);Right lower leg (including foot);Front perineal area Bathing: 6: More than reasonable amount of time  FIM - Upper Body Dressing/Undressing Upper body dressing/undressing steps patient completed: Thread/unthread right sleeve of pullover shirt/dresss;Thread/unthread left sleeve of pullover shirt/dress;Put head through opening of pull over shirt/dress;Pull shirt over trunk Upper body dressing/undressing: 5: Set-up assist to: Obtain clothing/put away FIM - Lower Body Dressing/Undressing Lower body dressing/undressing steps patient completed: Thread/unthread right underwear leg;Thread/unthread left underwear leg;Pull underwear up/down;Thread/unthread right pants leg;Thread/unthread left pants leg;Pull pants up/down;Fasten/unfasten left shoe;Fasten/unfasten right shoe;Don/Doff right sock;Don/Doff left sock;Don/Doff right shoe;Don/Doff left shoe Lower body dressing/undressing: 5: Set-up assist to: Obtain clothing  FIM - Toileting Toileting steps completed by patient: Adjust clothing prior to toileting;Performs perineal hygiene;Adjust clothing after toileting Toileting Assistive Devices: Grab bar or rail for support Toileting: 7: Independent: No helper, no device  FIM - Radio producer Devices: Grab bars;Walker Toilet Transfers: 5-To toilet/BSC: Supervision (verbal cues/safety issues);5-From toilet/BSC: Supervision (verbal cues/safety issues)  FIM - Control and instrumentation engineer Devices: Walker;Arm rests;Bed  rails Bed/Chair Transfer: 5: Supine > Sit: Supervision (verbal cues/safety issues);5: Bed > Chair or W/C: Supervision (verbal cues/safety issues);5: Chair or W/C > Bed: Supervision (verbal cues/safety issues);5:  Sit > Supine: Supervision (verbal cues/safety issues)  FIM - Locomotion: Wheelchair Distance: 150 Locomotion: Wheelchair: 0: Activity did not occur FIM - Locomotion: Ambulation Locomotion: Ambulation Assistive Devices: Walker - Rolling;Orthosis Ambulation/Gait Assistance: 5: Supervision Locomotion: Ambulation: 2: Travels 22 - 149 ft with supervision/safety issues  Comprehension Comprehension Mode: Auditory Comprehension: 7-Follows complex conversation/direction: With no assist  Expression Expression Mode: Verbal Expression: 7-Expresses complex ideas: With no assist  Social Interaction Social Interaction: 7-Interacts appropriately with others - No medications needed.  Problem Solving Problem Solving: 7-Solves complex problems: Recognizes & self-corrects  Memory Memory: 7-Complete Independence: No helper  Medical Problem List and Plan:  1. Functional deficits secondary to Bilateral Knee DJD.  2. DVT Prophylaxis/Anticoagulation: Pharmaceutical: Coumadin and Lovenox till INR therapeutic  3. Pain Management: Will continue oxycodone prn. Ice in between therapy sessions.  4. Mood: LCSW to follow for evaluation and support.  5. Neuropsych: This patient is capable of making decisions on his own behalf.  6. Skin/Wound Care: Monitor wound daily. Will add nutritional supplement for wound healing.  7. HTN: Will monitor BP every 8 hours. Hold lisinopril for SBP less than 120. Push po fluids.  8. ABLA: Continue iron supplement. hgb up to 8.1  9. Constipation: Will  miralax bid,senna , titrate as bowels function start becoming regular LOS (Days) 5 A FACE TO FACE EVALUATION WAS PERFORMED  Nolita Kutter E 02/24/2014 7:07 AM

## 2014-02-24 NOTE — Progress Notes (Signed)
Occupational Therapy Session Note  Patient Details  Name: James Atkinson MRN: [id number] Date of Birth: 01/16/1946  Today's Date: 02/24/2014 OT Individual Time: 1200-1300 OT Individual Time Calculation (min): 60 min   Short Term Goals: Week 1:  OT Short Term Goal 1 (Week 1): STGS = LTGS  Skilled Therapeutic Interventions/Progress Updates:    Addressed the following:  Functional mobility, toilet transfers, dynamic balance, LE stretching for increased flexibility.  Pt. Ambulated to gym.  Pt. Complained of stiffness.  Did some LE stretching in standing with counter to hold to.  Used sci fit for 2 sets of 5 minutes with forward and back rotation; workload at .  Pt. Complained of stomach pain.  Ambulated to bathroom with SBA and transfer to toilet.  Pt. Was continent of bowel and bladder.  Pt ambulated back to to gym.  Engaged in balance activities with horseshoes  With stepping forward and backward with left leg.  Pt. Ambulated back to room and left pt in wc with all items in reach.    Therapy Documentation Precautions:  Precautions Precautions: Knee;Fall Required Braces or Orthoses: Knee Immobilizer - Right;Knee Immobilizer - Left Knee Immobilizer - Right: Discontinue once straight leg raise with < 10 degree lag Knee Immobilizer - Left: Discontinue once straight leg raise with < 10 degree lag Restrictions Weight Bearing Restrictions: Yes RLE Weight Bearing: Weight bearing as tolerated LLE Weight Bearing: Weight bearing as tolerated     Pain:  2/10 after functional mobility   ADL: ADL ADL Comments: refer to FIM    See FIM for current functional status  Therapy/Group: Individual Therapy  Lisa Roca 02/24/2014, 1:17 PM

## 2014-02-25 ENCOUNTER — Inpatient Hospital Stay (HOSPITAL_COMMUNITY): Payer: BC Managed Care – PPO

## 2014-02-25 ENCOUNTER — Inpatient Hospital Stay (HOSPITAL_COMMUNITY): Payer: BC Managed Care – PPO | Admitting: *Deleted

## 2014-02-25 LAB — PROTIME-INR
INR: 1.9 — ABNORMAL HIGH (ref 0.00–1.49)
Prothrombin Time: 21.8 seconds — ABNORMAL HIGH (ref 11.6–15.2)

## 2014-02-25 MED ORDER — WARFARIN SODIUM 7.5 MG PO TABS
7.5000 mg | ORAL_TABLET | Freq: Once | ORAL | Status: AC
  Administered 2014-02-25: 7.5 mg via ORAL
  Filled 2014-02-25: qty 1

## 2014-02-25 NOTE — Progress Notes (Signed)
Occupational Therapy Session Note  Patient Details  Name: James Atkinson MRN: [id number] Date of Birth: 06-Nov-1945  Today's Date: 02/25/2014 OT Individual Time:  -   0945-1100 (75 min)    Short Term Goals: Week 1:  OT Short Term Goal 1 (Week 1): STGS = LTGS Week 2:     Skilled Therapeutic Interventions/Progress Updates:    Pt. Sitting EOB upon OT arrival.  Addressed the following: Functional mobility, showert transfers, dynamic balance, LE stretching for increased flexibility. Pt. Ambulated to retrieve items for shower.  Got clothes but forgot linens.  Pt. Doffed clothes and KI sitting on shower seat.  He was distant supervision with bathing.  Pt performed lateral leans to wash bottom.  Pt ambulated out to bed.  While pt leaning over to don footies, got dizzy and nauseated.  BP= 104/63, HR= 63.  Provided cold water and washcloth.  Pt. Rested.  BP= 114/65.  Pt did LE therex for remainder of session.   Pt. Feeling better after lying down.  Left in room with call bell.  Informed nurse of issues above.     Therapy Documentation Precautions:  Precautions Precautions: Knee;Fall Required Braces or Orthoses: Knee Immobilizer - Right;Knee Immobilizer - Left Knee Immobilizer - Right: Discontinue once straight leg raise with < 10 degree lag Knee Immobilizer - Left: Discontinue once straight leg raise with < 10 degree lag Restrictions Weight Bearing Restrictions: Yes RLE Weight Bearing: Weight bearing as tolerated LLE Weight Bearing: Weight bearing as tolerated      Pain: Pain Assessment Pain Assessment: 0-10 Pain Score: 4  Pain Type: Surgical pain Pain Location: Knee Pain Orientation: Right;Left Pain Descriptors / Indicators: Aching Pain Onset: Gradual Pain Intervention(s): Medication (See eMAR) ADL: ADL ADL Comments: refer to FIM Exercises:  LE stretching    See FIM for current functional status  Therapy/Group: Individual Therapy  Lisa Roca 02/25/2014, 7:52  AM

## 2014-02-25 NOTE — Progress Notes (Signed)
Physical Therapy Session Note  Patient Details  Name: James Atkinson MRN: [id number] Date of Birth: 08-04-1945  Today's Date: 02/25/2014 PT Individual Time: 0321-2248 PT Individual Time Calculation (min): 60 min  Session 2 Time: 2500-3704 Time Calculation (min) 60 min   Short Term Goals: Week 1:  PT Short Term Goal 1 (Week 1): = LTGs due to anticipated short LOS  Skilled Therapeutic Interventions/Progress Updates:    Session 1: Pt received seated EOB w/ L KI donned, agreeable to participate in therapy after brushing teeth and using bathroom. Pt w/ dynamic standing for brushing teeth at sink and urinating from standing position, all w/ S. Ambulated 120' to rehab gym w/ RW and close S, noted decreased hip/knee flexion on R during swing phase, pt reports legs were feeling stiff. Completed supine and prone therex on mat table including quad sets, SAQ, SLR, HS curls, assisted heel slides, 2x10 of all. Ambulated 120' back to room w/ RW and close S, noted increased hip/knee flexion in RLE during swing phase, increased heel-toe gait. Pt left seated EOB w/ all needs within reach, handoff to OT.  Session 2: Session focused on functional ambulation, BLE ROM/strengthening, functional endurance. Pt received seated EOB agreeable to participate in therapy. Pt ambulated 250' around rehab unit w/ RW and close S, mod cues for upright posture and for heel-toe gait on R foot. Pt completed 10' w/ LE only then 5' w/ UE/LE on Nustep w/ seat at 10 progressing to 9 for BLE strength and ROM. Ambulated 200' w/ RW and S, min cues for upright posture and heel-toe gait on R foot. Noted increased hip/knee flexion on RLE during swing phase post-Nustep. Pt w/ x5 foot taps on 4 inch step w/ R foot for increased R knee hip/knee active flexion. Noted throughout session pt w/ difficulty keeping R foot back during sit<>stand unless standing from elevated surface. Pt left seated EOB w/ all needs within reach.    Therapy  Documentation Precautions:  Precautions Precautions: Knee;Fall Required Braces or Orthoses: Knee Immobilizer - Right;Knee Immobilizer - Left Knee Immobilizer - Right: Discontinue once straight leg raise with < 10 degree lag Knee Immobilizer - Left: Discontinue once straight leg raise with < 10 degree lag Restrictions Weight Bearing Restrictions: Yes RLE Weight Bearing: Weight bearing as tolerated LLE Weight Bearing: Weight bearing as tolerated Vital Signs: Therapy Vitals Pulse Rate: 63 BP: 104/63 mmHg Pain: Pain Assessment Pain Assessment: 0-10 Pain Score: 5  Pain Type: Surgical pain Pain Location: Knee Pain Orientation: Right;Left Pain Descriptors / Indicators: Aching Pain Onset: With Activity Pain Intervention(s):  (Pt pre-medicated prior to session) Locomotion : Ambulation Ambulation/Gait Assistance: 5: Supervision   See FIM for current functional status  Therapy/Group: Individual Therapy  Rada Hay Rada Hay, PT, DPT 02/25/2014, 11:10 AM

## 2014-02-25 NOTE — Progress Notes (Signed)
[facility] PHYSICAL MEDICINE & REHABILITATION     PROGRESS NOTE    Subjective/Complaints: Left leg tighter than Right Looking forward to D/C on 8/25 CPM to 70 deg flex yesterday Review of Systems - Negative except B Knee pain  Objective: Vital Signs: Blood pressure 121/73, pulse 64, temperature 98.6 F (37 C), temperature source Oral, resp. rate 17, SpO2 95.00%. No results found. No results found for this basename: WBC, HGB, HCT, PLT,  in the last 72 hours No results found for this basename: NA, K, CL, CO, GLUCOSE, BUN, CREATININE, CALCIUM,  in the last 72 hours CBG (last 3)  No results found for this basename: GLUCAP,  in the last 72 hours  Wt Readings from Last 3 Encounters:  02/14/14 70.308 kg (155 lb)  02/14/14 70.308 kg (155 lb)  02/05/14 70.308 kg (155 lb)    Physical Exam:  Constitutional: He is oriented to person, place, and time. He appears well-developed and well-nourished.  HENT: oral mucosa pink and moist  Head: Normocephalic and atraumatic.  Right Ear: External ear normal.  Left Ear: External ear normal.  Eyes: Conjunctivae and EOM are normal. Pupils are equal, round, and reactive to light.  Neck: Normal range of motion. Neck supple.  Cardiovascular: Normal rate, regular rhythm and normal heart sounds. No murmurs or rubs  Respiratory: Effort normal and breath sounds normal. No wheezes or rales  GI: Soft. He exhibits distension.  Musculoskeletal:  Bilateral Knee with minimal edema, PROM to about 70 degrees of flexion,extension to -20 deg min knee effusion  unable to perform SLR Neurological: He is alert and oriented to person, place, and time. UES 5/5. LE: HF 3/5, KE 2-/5, ADF/APF 4+. Normal sensory exam. CN exam normal.  Skin: Skin is warm and dry. Wounds are dry/intact/well-approximated Psychiatric: He has a normal mood and affect    Assessment/Plan: 1. Functional deficits secondary to bilateral TKA's due to endstage OA of knees which require 3+ hours per  day of interdisciplinary therapy in a comprehensive inpatient rehab setting. Physiatrist is providing close team supervision and 24 hour management of active medical problems listed below. Physiatrist and rehab team continue to assess barriers to discharge/monitor patient progress toward functional and medical goals.  FIM: FIM - Bathing Bathing Steps Patient Completed: Chest;Right Arm;Left Arm;Abdomen;Left upper leg;Right upper leg;Buttocks;Left lower leg (including foot);Right lower leg (including foot);Front perineal area Bathing: 6: More than reasonable amount of time  FIM - Upper Body Dressing/Undressing Upper body dressing/undressing steps patient completed: Thread/unthread right sleeve of pullover shirt/dresss;Thread/unthread left sleeve of pullover shirt/dress;Put head through opening of pull over shirt/dress;Pull shirt over trunk Upper body dressing/undressing: 5: Set-up assist to: Obtain clothing/put away FIM - Lower Body Dressing/Undressing Lower body dressing/undressing steps patient completed: Thread/unthread right underwear leg;Thread/unthread left underwear leg;Pull underwear up/down;Thread/unthread right pants leg;Thread/unthread left pants leg;Pull pants up/down;Fasten/unfasten left shoe;Fasten/unfasten right shoe;Don/Doff right sock;Don/Doff left sock;Don/Doff right shoe;Don/Doff left shoe Lower body dressing/undressing: 5: Set-up assist to: Obtain clothing  FIM - Toileting Toileting steps completed by patient: Adjust clothing prior to toileting;Performs perineal hygiene;Adjust clothing after toileting Toileting Assistive Devices: Grab bar or rail for support Toileting: 7: Independent: No helper, no device  FIM - Radio producer Devices: Grab bars;Walker Toilet Transfers: 5-To toilet/BSC: Supervision (verbal cues/safety issues);5-From toilet/BSC: Supervision (verbal cues/safety issues)  FIM - Control and instrumentation engineer  Devices: Walker;Arm rests;Bed rails Bed/Chair Transfer: 5: Supine > Sit: Supervision (verbal cues/safety issues);5: Bed > Chair or W/C: Supervision (verbal cues/safety issues);5: Chair or W/C >  Bed: Supervision (verbal cues/safety issues);5: Sit > Supine: Supervision (verbal cues/safety issues)  FIM - Locomotion: Wheelchair Distance: 150 Locomotion: Wheelchair: 0: Activity did not occur FIM - Locomotion: Ambulation Locomotion: Ambulation Assistive Devices: Walker - Rolling;Orthosis Ambulation/Gait Assistance: 5: Supervision Locomotion: Ambulation: 2: Travels 50 - 149 ft with supervision/safety issues  Comprehension Comprehension Mode: Auditory Comprehension: 7-Follows complex conversation/direction: With no assist  Expression Expression Mode: Verbal Expression: 7-Expresses complex ideas: With no assist  Social Interaction Social Interaction: 7-Interacts appropriately with others - No medications needed.  Problem Solving Problem Solving: 7-Solves complex problems: Recognizes & self-corrects  Memory Memory: 7-Complete Independence: No helper  Medical Problem List and Plan:  1. Functional deficits secondary to Bilateral Knee DJD.  2. DVT Prophylaxis/Anticoagulation: Pharmaceutical: Coumadin and Lovenox till INR therapeutic  3. Pain Management: Will continue oxycodone prn. Ice in between therapy sessions.  4. Mood: LCSW to follow for evaluation and support.  5. Neuropsych: This patient is capable of making decisions on his own behalf.  6. Skin/Wound Care: Monitor wound daily. Will add nutritional supplement for wound healing.  7. HTN: Will monitor BP every 8 hours. Hold lisinopril for SBP less than 120. Push po fluids.  8. ABLA: Continue iron supplement. hgb up to 8.1  9. Constipation: Will  miralax bid,senna , titrate as bowels function start becoming regular LOS (Days) 6 A FACE TO FACE EVALUATION WAS PERFORMED  Charlett Blake 02/25/2014 6:29 AM

## 2014-02-25 NOTE — Progress Notes (Signed)
ANTICOAGULATION CONSULT NOTE - Follow Up Consult  Pharmacy Consult for Coumadin Indication: VTE prophylaxis  Allergies  Allergen Reactions  . Ciprofloxacin Nausea Only    Patient Measurements:   Heparin Dosing Weight:   Vital Signs: Temp: 98.6 F (37 C) (08/23 0538) Temp src: Oral (08/23 0538) BP: 121/73 mmHg (08/23 0538) Pulse Rate: 64 (08/23 0538)  Labs:  Recent Labs  02/23/14 0620 02/24/14 0819 02/25/14 0315  LABPROT 21.9* 23.0* 21.8*  INR 1.91* 2.04* 1.90*    The CrCl is unknown because both a height and weight (above a minimum accepted value) are required for this calculation.   Medications:  Scheduled:  . iron polysaccharides  150 mg Oral BID  . lisinopril  20 mg Oral q morning - 10a  . pantoprazole  40 mg Oral Daily  . polyethylene glycol  17 g Oral BID  . senna-docusate  2 tablet Oral BID  . sodium phosphate  1 enema Rectal Once  . Warfarin - Pharmacist Dosing Inpatient   Does not apply q1800   Infusions:    Assessment: 68 yo male s/p TKA receiving anticoagulation with Coumadin.  INR today is 1.9 with a slight decrease from yesterday.  Goal of Therapy:  INR 2-3 Monitor platelets by anticoagulation protocol: Yes   Plan:  Warfarin 7.5 mg PO x 1 today  PT/INR daily  Legrand Como, Pharm.D., BCPS, AAHIVP Clinical Pharmacist Phone: [phone] or [phone] 02/25/2014, 8:55 AM

## 2014-02-26 ENCOUNTER — Inpatient Hospital Stay (HOSPITAL_COMMUNITY): Payer: BC Managed Care – PPO

## 2014-02-26 ENCOUNTER — Encounter (HOSPITAL_COMMUNITY): Payer: BC Managed Care – PPO | Admitting: Occupational Therapy

## 2014-02-26 DIAGNOSIS — K5900 Constipation, unspecified: Secondary | ICD-10-CM | POA: Diagnosis present

## 2014-02-26 DIAGNOSIS — IMO0002 Reserved for concepts with insufficient information to code with codable children: Secondary | ICD-10-CM

## 2014-02-26 DIAGNOSIS — Z5189 Encounter for other specified aftercare: Secondary | ICD-10-CM

## 2014-02-26 DIAGNOSIS — M1710 Unilateral primary osteoarthritis, unspecified knee: Secondary | ICD-10-CM

## 2014-02-26 DIAGNOSIS — I10 Essential (primary) hypertension: Secondary | ICD-10-CM | POA: Diagnosis present

## 2014-02-26 DIAGNOSIS — Z96659 Presence of unspecified artificial knee joint: Secondary | ICD-10-CM

## 2014-02-26 DIAGNOSIS — D62 Acute posthemorrhagic anemia: Secondary | ICD-10-CM

## 2014-02-26 LAB — PROTIME-INR
INR: 1.92 — AB (ref 0.00–1.49)
PROTHROMBIN TIME: 22 s — AB (ref 11.6–15.2)

## 2014-02-26 MED ORDER — WARFARIN SODIUM 7.5 MG PO TABS
7.5000 mg | ORAL_TABLET | Freq: Once | ORAL | Status: AC
  Administered 2014-02-26: 7.5 mg via ORAL
  Filled 2014-02-26 (×2): qty 1

## 2014-02-26 NOTE — Progress Notes (Signed)
Social Work Patient ID: James Atkinson, male   DOB: 1946-03-22, 68 y.o.   MRN: [id number] Spoke with Juliann Mule who has approved pt through 8/24 with update today.  Informed of discharge tomorrow.

## 2014-02-26 NOTE — Progress Notes (Signed)
Social Work Patient ID: James Atkinson, male   DOB: 1945/12/09, 68 y.o.   MRN: [id number] Pt doing well in therapies, made mod/i in room today.  He has all DME and [facility] set up to provide home health follow up. Wife was here Friday and attended therapies with pt.  Pt feels ready for discharge tomorrow.

## 2014-02-26 NOTE — Progress Notes (Signed)
Occupational Therapy Discharge Summary  Patient Details  Name: HELIO LACK MRN: [id number] Date of Birth: 08-12-1945   Patient has met 7 of 7 long term goals due to improved activity tolerance, improved balance and ability to compensate for deficits.  Patient to discharge at overall Modified Independent level.  Patient's care partner is independent to provide the necessary physical assistance at discharge.    Reasons goals not met: n/a  Recommendation:  No further OT services recommended.  Equipment: No equipment provided  Reasons for discharge: treatment goals met  Patient/family agrees with progress made and goals achieved: Yes  OT Discharge ADL ADL ADL Comments: mod I Vision/Perception  Vision- History Baseline Vision/History: No visual deficits Patient Visual Report: No change from baseline Vision- Assessment Additional Comments: WFL Perception Comments: WFL  Cognition Overall Cognitive Status: Within Functional Limits for tasks assessed Orientation Level: Oriented X4 Sensation Sensation Light Touch: Appears Intact Stereognosis: Appears Intact Hot/Cold: Appears Intact Coordination Gross Motor Movements are Fluid and Coordinated: Yes Fine Motor Movements are Fluid and Coordinated: Yes Motor  Motor Motor: Within Functional Limits Mobility    mod I with RW Trunk/Postural Assessment  Cervical Assessment Cervical Assessment: Exceptions to [facility] (forward flexed) Thoracic Assessment Thoracic Assessment: Within Functional Limits Lumbar Assessment Lumbar Assessment: Within Functional Limits Postural Control Postural Control: Within Functional Limits  Balance Static Standing Balance Static Standing - Level of Assistance: 6: Modified independent (Device/Increase time) Dynamic Standing Balance Dynamic Standing - Level of Assistance: 6: Modified independent (Device/Increase time) Extremity/Trunk Assessment RUE Assessment RUE Assessment: Within Functional  Limits LUE Assessment LUE Assessment: Within Functional Limits  See FIM for current functional status  SAGUIER,JULIA 02/26/2014, 12:27 PM

## 2014-02-26 NOTE — Progress Notes (Signed)
ANTICOAGULATION CONSULT NOTE - Follow Up Consult  Pharmacy Consult for coumadin Indication: VTE prophylaxis  Allergies  Allergen Reactions  . Ciprofloxacin Nausea Only    Patient Measurements:   Heparin Dosing Weight:   Vital Signs: Temp: 98.5 F (36.9 C) (08/24 0616) Temp src: Oral (08/24 0616) BP: 138/62 mmHg (08/24 0616) Pulse Rate: 63 (08/24 0616)  Labs:  Recent Labs  02/24/14 0819 02/25/14 0315 02/26/14 0614  LABPROT 23.0* 21.8* 22.0*  INR 2.04* 1.90* 1.92*    The CrCl is unknown because both a height and weight (above a minimum accepted value) are required for this calculation.   Medications:  Scheduled:  . iron polysaccharides  150 mg Oral BID  . lisinopril  20 mg Oral q morning - 10a  . pantoprazole  40 mg Oral Daily  . polyethylene glycol  17 g Oral BID  . senna-docusate  2 tablet Oral BID  . sodium phosphate  1 enema Rectal Once  . Warfarin - Pharmacist Dosing Inpatient   Does not apply q1800   Infusions:    Assessment: 68 yo male s/p TKA is currently on slightly subtherapeutic coumadin.  INR today is 1.92.  Goal of Therapy:  INR 2-3 Monitor platelets by anticoagulation protocol: Yes   Plan:  1) coumadin 7.5mg  po x1 2) INR in am  Hermen Mario, [name] 02/26/2014,8:05 AM

## 2014-02-26 NOTE — Discharge Summary (Signed)
Physician Discharge Summary  Patient ID: James Atkinson MRN: [id number] DOB/AGE: May 30, 1946 68 y.o.  Admit date: 02/19/2014 Discharge date: 02/26/2014  Discharge Diagnoses:  Principal Problem:   OA (osteoarthritis) of knee Active Problems:   Postoperative anemia due to acute blood loss   Unspecified constipation   HTN (hypertension)   Discharged Condition: Stable.     Labs:  Basic Metabolic Panel:  Recent Labs Lab 02/20/14 0527  NA 137  K 4.4  CL 100  CO2 25  GLUCOSE 111*  BUN 20  CREATININE 0.81  CALCIUM 8.8    CBC: CBC Latest Ref Rng 02/20/2014 02/18/2014 02/17/2014  WBC 4.0 - 10.5 K/uL 5.1 5.6 7.8  Hemoglobin 13.0 - 17.0 g/dL 8.1(L) 7.7(L) 8.3(L)  Hematocrit 39.0 - 52.0 % 23.0(L) 22.4(L) 24.0(L)  Platelets 150 - 400 K/uL 188 111(L) 120(L)     CBG: No results found for this basename: GLUCAP,  in the last 168 hours  Brief HPI:   [name] is a 68 y.o. male with history of HTN, GERD, dysphagia s/p dilatation, DJD/DDD with endstage changes bilateral knees and failure of conservative therapy. He elected to undergo B-TKR on 02/14/14 by Dr. Chanetta Marshall. Post op is WBAT and on coumadin for DVT prophylaxis. Has epidural for pain control but developed hypotension last pm requiring fluid resuscitation. ABLA being monitored and blood pressures are improving. Therapy ongoing and CIR recommended by MD as well as rehab team.   Hospital Course: Marty Heck was admitted to rehab 02/19/2014 for inpatient therapies to consist of PT, ST and OT at least three hours five days a week. Past admission physiatrist, therapy team and rehab RN have worked together to provide customized collaborative inpatient rehab. He was maintained on lovenox till INR therapeutic. ABLA has been monitored and has been stable. He is to continue iron supplement for another month and have follow up labs checked by PMD. He was started on bowel program with resolution of constipation. Pain control has been  reasonable and patient was instructed on taper of narcotics past discharge. Bilateral knee incisions are clean, dry, intact and healing without s/s of infection. He has made great progress and is independent with walker at discharge. He will continue to receive HHPT, HHOT and [place] by [facility] past discharge.    Rehab course: During patient's stay in rehab weekly team conferences were held to monitor patient's progress, set goals and discuss barriers to discharge. Patient has had improvement in activity tolerance, balance, postural control, as well as ability to compensate for deficits. He is modified independent for ADLs as well as mobility. He is able to ambulate 250 feet with RW and navigate 5 stairs independently. Right knee flexion is at 73 degrees with 10 degree extension lag. Left knee flexion is 80 degrees with 20 degree extension lag.    Disposition: Home  Diet: Regular  Special Instructions: 1. Use CPM at least 3-6 hours daily.  2. Wear support stocking for 3 weeks.     Medication List    ASK your doctor about these medications       DSS 100 MG Caps  Take 100 mg by mouth 2 (two) times daily.     enoxaparin 30 MG/0.3ML injection  Commonly known as:  LOVENOX  Inject 0.3 mLs (30 mg total) into the skin every 12 (twelve) hours.     EYE ALLERGY RELIEF 0.025-0.3 % ophthalmic solution  Generic drug:  naphazoline-pheniramine  Place 1 drop into both eyes 4 (four) times  daily as needed for irritation.     iron polysaccharides 150 MG capsule  Commonly known as:  NIFEREX  Take 1 capsule (150 mg total) by mouth 2 (two) times daily.     lisinopril 20 MG tablet  Commonly known as:  PRINIVIL,ZESTRIL  Take 20 mg by mouth every morning.     methocarbamol 500 MG tablet  Commonly known as:  ROBAXIN  Take 1 tablet (500 mg total) by mouth every 6 (six) hours as needed for muscle spasms.     OVER THE COUNTER MEDICATION  Take 1 tablet by mouth 2 (two) times daily. Beta Prostate      oxyCODONE 5 MG immediate release tablet--Rx # 75 pills   Commonly known as:  Oxy IR/ROXICODONE  Take 1-4 tablets (5-20 mg total) by mouth every 3 (three) hours as needed for breakthrough pain.     pantoprazole 40 MG tablet  Commonly known as:  PROTONIX  Take 1 tablet (40 mg total) by mouth daily.     polyethylene glycol packet  Commonly known as:  MIRALAX / GLYCOLAX  Take 17 g by mouth daily as needed for mild constipation.     traMADol 50 MG tablet--Rx #45 pills   Commonly known as:  ULTRAM  Take 1-2 tablets (50-100 mg total) by mouth every 6 (six) hours as needed for moderate pain.     warfarin 7.5 MG tablet  Commonly known as:  COUMADIN  Take 1 tablet (7.5 mg total) by mouth one time only at 6 PM.         Signed: Bary Leriche 02/26/2014, 2:47 PM

## 2014-02-26 NOTE — Progress Notes (Signed)
Physical Therapy Session Note  Patient Details  Name: James Atkinson MRN: [id number] Date of Birth: 03/10/1946  Today's Date: 02/26/2014 PT Individual Time: 1345-1415 PT Individual Time Calculation (min): 30 min   Short Term Goals: Week 1:  PT Short Term Goal 1 (Week 1): = LTGs due to anticipated short LOS  Skilled Therapeutic Interventions/Progress Updates:  1:1. Pt received sitting in recliner, ready for therapy. Focus this session on functional endurance, functional mobility and balance in therapy apartment. Pt mod(I) ambulation w/ RW 150' w/ L KI in place and then 250' w/out L KI, pt initially wearing due to fear of increased pain but then removing it due to lack of pain increase. RW elevated for improved posture and to facilitate increased WB through B LE vs. UE. Pt practiced making bed in therapy apartment to target standing endurance and dynamic balance w/ intermittent use of RW, overall mod(I). Pt educated on use of walker back and safe transport of items from counter<>table at home, pt verbalized understanding. Pt left sitting EOB w/ all needs in reach.   Therapy Documentation Precautions:  Precautions Precautions: Knee;Fall Required Braces or Orthoses: Knee Immobilizer - Right;Knee Immobilizer - Left Knee Immobilizer - Right: Discontinue once straight leg raise with < 10 degree lag Knee Immobilizer - Left: Discontinue once straight leg raise with < 10 degree lag Restrictions Weight Bearing Restrictions: Yes RLE Weight Bearing: Weight bearing as tolerated LLE Weight Bearing: Weight bearing as tolerated  See FIM for current functional status  Therapy/Group: Individual Therapy  Gilmore Laroche 02/26/2014, 5:23 PM

## 2014-02-26 NOTE — Progress Notes (Signed)
Occupational Therapy Session Note  Patient Details  Name: James Atkinson MRN: [id number] Date of Birth: [birth year]  Today's Date: 02/26/2014 OT Individual Time: 1018-1100 OT Individual Time Calculation (min): 42 min   Short Term Goals: No short term goals set  Skilled Therapeutic Interventions/Progress Updates:    Pt seen for BADL training of B/D with a focus on patient completing tasks at a mod I level to prepare for home. Pt's PT stated that he does not need his KI to walk short distances in his room. Pt chooses to wear the R KI for support when he is walking further distances. Pt was able to gather his clothing ambulating around the room, walked into bathroom and completed self care with mod I. Pt stood at sink to complete self care. Overall mod I with self care. Pt is ready to go home tomorrow.    Therapy Documentation Precautions:  Precautions Precautions: Knee;Fall Required Braces or Orthoses: Knee Immobilizer - Right;Knee Immobilizer - Left Knee Immobilizer - Right: Discontinue once straight leg raise with < 10 degree lag Knee Immobilizer - Left: Discontinue once straight leg raise with < 10 degree lag Restrictions Weight Bearing Restrictions: Yes RLE Weight Bearing: Weight bearing as tolerated LLE Weight Bearing: Weight bearing as tolerated     Pain: Pain Assessment Pain Assessment: 0-10 Pain Score: 2  (premedicated) Pain Intervention(s): Medication (See eMAR) ADL: ADL ADL Comments: refer to FIM    See FIM for current functional status  Therapy/Group: Individual Therapy  [place] 02/26/2014, 12:11 PM

## 2014-02-26 NOTE — Progress Notes (Signed)
[place] PHYSICAL MEDICINE & REHABILITATION     PROGRESS NOTE    Subjective/Complaints: Pt without new c/os today Discussed D/C process for tomorrow Still 0-70 deg for CPM Review of Systems - Negative except B Knee pain  Objective: Vital Signs: Blood pressure 138/62, pulse 63, temperature 98.5 F (36.9 C), temperature source Oral, resp. rate 18, SpO2 96.00%. No results found. No results found for this basename: WBC, HGB, HCT, PLT,  in the last 72 hours No results found for this basename: NA, K, CL, CO, GLUCOSE, BUN, CREATININE, CALCIUM,  in the last 72 hours CBG (last 3)  No results found for this basename: GLUCAP,  in the last 72 hours  Wt Readings from Last 3 Encounters:  02/14/14 70.308 kg (155 lb)  02/14/14 70.308 kg (155 lb)  02/05/14 70.308 kg (155 lb)    Physical Exam:  Constitutional: He is oriented to person, place, and time. He appears well-developed and well-nourished.  HENT: oral mucosa pink and moist  Head: Normocephalic and atraumatic.  Right Ear: External ear normal.  Left Ear: External ear normal.  Eyes: Conjunctivae and EOM are normal. Pupils are equal, round, and reactive to light.  Neck: Normal range of motion. Neck supple.  Cardiovascular: Normal rate, regular rhythm and normal heart sounds. No murmurs or rubs  Respiratory: Effort normal and breath sounds normal. No wheezes or rales  GI: Soft. He exhibits distension.  Musculoskeletal:  Bilateral Knee with minimal edema, PROM to about 70 degrees of flexion,extension to -20 deg min knee effusion  unable to perform SLR Neurological: He is alert and oriented to person, place, and time. UES 5/5. LE: HF 3/5, KE 2-/5, ADF/APF 4+. Normal sensory exam. CN exam normal.  Skin: Skin is warm and dry. Wounds are dry/intact/well-approximated Psychiatric: He has a normal mood and affect    Assessment/Plan: 1. Functional deficits secondary to bilateral TKA's due to endstage OA of knees which require 3+ hours per  day of interdisciplinary therapy in a comprehensive inpatient rehab setting. Physiatrist is providing close team supervision and 24 hour management of active medical problems listed below. Physiatrist and rehab team continue to assess barriers to discharge/monitor patient progress toward functional and medical goals.  Plan D/C for am rec CPM untiul goal of 90 is reached, will need HH to provide FIM: FIM - Bathing Bathing Steps Patient Completed: Chest;Right Arm;Left Arm;Abdomen;Left upper leg;Right upper leg;Buttocks;Left lower leg (including foot);Right lower leg (including foot);Front perineal area Bathing: 6: More than reasonable amount of time  FIM - Upper Body Dressing/Undressing Upper body dressing/undressing steps patient completed: Thread/unthread right sleeve of pullover shirt/dresss;Thread/unthread left sleeve of pullover shirt/dress;Put head through opening of pull over shirt/dress;Pull shirt over trunk Upper body dressing/undressing: 6: More than reasonable amount of time FIM - Lower Body Dressing/Undressing Lower body dressing/undressing steps patient completed: Thread/unthread right underwear leg;Thread/unthread left underwear leg;Pull underwear up/down;Thread/unthread right pants leg;Thread/unthread left pants leg;Pull pants up/down;Fasten/unfasten left shoe;Fasten/unfasten right shoe;Don/Doff right sock;Don/Doff left sock;Don/Doff right shoe;Don/Doff left shoe Lower body dressing/undressing: 5: Supervision: Safety issues/verbal cues  FIM - Toileting Toileting steps completed by patient: Adjust clothing prior to toileting;Performs perineal hygiene;Adjust clothing after toileting Toileting Assistive Devices: Grab bar or rail for support Toileting: 7: Independent: No helper, no device  FIM - Radio producer Devices: Grab bars;Walker Clinical cytogeneticist Transfers: 0-Activity did not occur  FIM - Control and instrumentation engineer Devices:  Copy: 6: Supine > Sit: No assist;6: Sit > Supine: No assist;5: Bed > Chair or  W/C: Supervision (verbal cues/safety issues);5: Chair or W/C > Bed: Supervision (verbal cues/safety issues)  FIM - Locomotion: Wheelchair Distance: 150 Locomotion: Wheelchair: 0: Activity did not occur FIM - Locomotion: Ambulation Locomotion: Ambulation Assistive Devices: Walker - Rolling;Orthosis (L KI) Ambulation/Gait Assistance: 5: Supervision Locomotion: Ambulation: 5: Travels 150 ft or more with supervision/safety issues  Comprehension Comprehension Mode: Auditory Comprehension: 7-Follows complex conversation/direction: With no assist  Expression Expression Mode: Verbal Expression: 7-Expresses complex ideas: With no assist  Social Interaction Social Interaction: 7-Interacts appropriately with others - No medications needed.  Problem Solving Problem Solving: 7-Solves complex problems: Recognizes & self-corrects  Memory Memory: 7-Complete Independence: No helper  Medical Problem List and Plan:  1. Functional deficits secondary to Bilateral Knee DJD.  2. DVT Prophylaxis/Anticoagulation: Pharmaceutical: Coumadin and Lovenox till INR therapeutic  3. Pain Management: Will continue oxycodone prn. Ice in between therapy sessions.  4. Mood: LCSW to follow for evaluation and support.  5. Neuropsych: This patient is capable of making decisions on his own behalf.  6. Skin/Wound Care: Monitor wound daily. Will add nutritional supplement for wound healing.  7. HTN: Will monitor BP every 8 hours. Hold lisinopril for SBP less than 120. Push po fluids.  8. ABLA: Continue iron supplement. hgb up to 8.1  9. Constipation: Will  miralax bid,senna , titrate as bowels function start becoming regular LOS (Days) 7 A FACE TO FACE EVALUATION WAS PERFORMED  Charlett Blake 02/26/2014 6:49 AM

## 2014-02-26 NOTE — Progress Notes (Addendum)
Physical Therapy Discharge Summary  Patient Details  Name: James Atkinson MRN: [id number] Date of Birth: [birth year]  Today's Date: 02/26/2014 PT Individual Time:0800-0900,  1445-1530 PT Individual Time Calculation (min): 60 min, 45 min   Patient has met 9 of 9 long term goals due to improved activity tolerance, improved balance, improved postural control, increased strength, increased range of motion, decreased pain, ability to compensate for deficits and functional use of  right lower extremity and left lower extremity.  Patient to discharge at an ambulatory level Modified Independent.   Patient's care partner is not needed to provide  any assistance at discharge, except for car transfer; pt is able to explain his needs for this.  Reasons goals not met: n/a  Recommendation:  Patient will benefit from ongoing skilled PT services in home health setting transitioning OPPT to continue to advance safe functional mobility, address ongoing impairments in strength, flexibility, coordination, balance, and minimize fall risk.  Equipment: No equipment provided  Reasons for discharge: treatment goals met and discharge from hospital  Patient/family agrees with progress made and goals achieved: Yes  PT Discharge Tx 1:  Checked L knee for extensor lag during straight leg raise, negative.  Gait with RW without neither KI in room to toilet and sink, supervision, occasional VCs for hand placement and staying within RW; standing tolerance at sink and toilet x 3 minutes each.  Pt requested putting L KI back on due to increased pain L knee with static and dynamic standing.Pt got pajama pants wet with urine while standing to urinate; he doffed pants and donned shorts sitting EOB, supervision, VCs for best technique while wearing L KI.  Gait to/from gym including L and R sideways, backwards, supervision with Rw. Ramp, curb x 2 with RW, up/down 5 steps with 2 rails, supervsion.  Pt left sitting EOB in room with  all needs within place.  Safety plan updated regarding LKI for pain relief only, not needed for stability.  Tx 2:  Without KIs, gait through obstacle course of canes and cones; pt did not encounter any obstacles and sequenced stepping over canes without cues.  No cues needed for hand placement sit>< stand, modified independent. bil LE knee flexibility exs focusing on flexion, including overhead ball toss in sitting with crossed LEs R and L.  neuromuscular re-education for balance retraining standing on compliant foam for R><L wt shifting and toe raises; given minimal external perturbations, pt demonstrated delayed and inadequate bil ankle strategy, delayed hip strategy bil; stepping strategy NT. NuStep for bil knee flexibility at level 4 using bil LES and bil LEs x 3 minutes. Gait x 150' modified independent through simulated community environment, Rw. Precautions/Restrictions- KIs discontinued as pt no longer has extensor lag with R or L straight leg raise   Pain Pain Assessment Pain Assessment: 0-10 Pain Score: 5  Pain Type: Acute pain Pain Location: Knee Pain Orientation: Right;Left Pain Descriptors / Indicators: Discomfort Pain Onset: Gradual Pain Intervention(s): Medication (See eMAR) Vision/Perception  Vision - Assessment Additional Comments: WFL Perception Comments: WFL  Cognition Overall Cognitive Status: Within Functional Limits for tasks assessed Sensation Sensation Light Touch: Appears Intact Stereognosis: Appears Intact Hot/Cold: Appears Intact Coordination Gross Motor Movements are Fluid and Coordinated: Yes Fine Motor Movements are Fluid and Coordinated: Yes Motor  Motor Motor: Within Functional Limits  Mobility Bed Mobility Bed Mobility:  (modified independent for all) Transfers Transfers: Yes Stand Pivot Transfers: 6: Modified independent (Device/Increase time) Locomotion  Ambulation Ambulation: Yes Ambulation/Gait Assistance: 6: Modified independent  (  Device/Increase time) Ambulation Distance (Feet): 150 Feet Assistive device: Rolling walker Gait Gait: Yes Gait Pattern: Impaired Gait Pattern: Step-through pattern;Decreased hip/knee flexion - left;Decreased hip/knee flexion - right;Right flexed knee in stance;Left flexed knee in stance;Lateral trunk lean to right Gait velocity: decreased High Level Ambulation High Level Ambulation: Side stepping;Backwards walking Side Stepping: modified independent Backwards Walking: modified independent Stairs / Additional Locomotion Stairs: Yes Stairs Assistance: 6: Modified independent (Device/Increase time) Stair Management Technique: Two rails Number of Stairs: 5 Height of Stairs: 7 Ramp: 6: Modified independent (Device) Curb: 6: Modified independent (Device/increase time) Architect: Yes Wheelchair Assistance: 6: Modified independent (Device/Increase time) Wheelchair Parts Management: Supervision/cueing Distance: 150  Trunk/Postural Assessment  Cervical Assessment Cervical Assessment: Exceptions to [facility] (forward flexed) Thoracic Assessment Thoracic Assessment: Within Functional Limits Lumbar Assessment Lumbar Assessment: Within Functional Limits Postural Control Postural Control: Within Functional Limits  Balance Balance Balance Assessed: Yes Static Standing Balance Static Standing - Balance Support: No upper extremity supported Static Standing - Level of Assistance: 6: Modified independent (Device/Increase time) Dynamic Standing Balance Dynamic Standing - Level of Assistance: 6: Modified independent (Device/Increase time) Dynamic Standing - Balance Activities: Foam balance beam Extremity Assessment  RUE Assessment RUE Assessment: Within Functional Limits LUE Assessment LUE Assessment: Within Functional Limits RLE AROM (degrees) Pt reported that he was unable to fully straighten his knees before TKAs.  RLE Overall AROM Comments: in sitting at 90  degreees hip flexion: knee flexion 73 degrees; in supine, knee ext lacking 10 degrees RLE Strength RLE Overall Strength: Deficits;Due to pain;Within Functional Limits for tasks assessed (able to perfrom straight leg raise with minimal ext lag) LLE Assessment LLE Assessment: Exceptions to [facility] LLE AROM (degrees) Overall AROM Left Lower Extremity: Deficits;Due to decreased strength;Due to pain LLE Overall AROM Comments: in sitting at 90 degrees hip flexion: 80 degrees knee flexion; in supine knee ext lacking 20 degrees LLE Strength LLE Overall Strength: Deficits;Due to pain;Within Functional Limits for tasks assessed  See FIM for current functional status  Veneta Sliter 02/26/2014, 4:11 PM

## 2014-02-27 DIAGNOSIS — M1710 Unilateral primary osteoarthritis, unspecified knee: Secondary | ICD-10-CM

## 2014-02-27 DIAGNOSIS — D62 Acute posthemorrhagic anemia: Secondary | ICD-10-CM

## 2014-02-27 DIAGNOSIS — Z96659 Presence of unspecified artificial knee joint: Secondary | ICD-10-CM

## 2014-02-27 DIAGNOSIS — Z5189 Encounter for other specified aftercare: Secondary | ICD-10-CM

## 2014-02-27 DIAGNOSIS — IMO0002 Reserved for concepts with insufficient information to code with codable children: Secondary | ICD-10-CM

## 2014-02-27 LAB — PROTIME-INR
INR: 1.89 — AB (ref 0.00–1.49)
Prothrombin Time: 21.7 seconds — ABNORMAL HIGH (ref 11.6–15.2)

## 2014-02-27 MED ORDER — WARFARIN SODIUM 7.5 MG PO TABS
7.5000 mg | ORAL_TABLET | Freq: Once | ORAL | Status: DC
  Filled 2014-02-27: qty 1

## 2014-02-27 MED ORDER — POLYSACCHARIDE IRON COMPLEX 150 MG PO CAPS: 150.0000 mg | ORAL_CAPSULE | Freq: Two times a day (BID) | ORAL | Status: DC

## 2014-02-27 MED ORDER — METHOCARBAMOL 500 MG PO TABS: 500.0000 mg | ORAL_TABLET | Freq: Four times a day (QID) | ORAL | Status: DC | PRN

## 2014-02-27 MED ORDER — TRAMADOL HCL 50 MG PO TABS: 50.0000 mg | ORAL_TABLET | Freq: Four times a day (QID) | ORAL | Status: DC | PRN

## 2014-02-27 MED ORDER — WARFARIN SODIUM 5 MG PO TABS: 7.5000 mg | ORAL_TABLET | Freq: Every day | ORAL | Status: DC

## 2014-02-27 MED ORDER — OXYCODONE HCL 5 MG PO TABS: ORAL_TABLET | ORAL | Status: DC

## 2014-02-27 MED ORDER — POLYETHYLENE GLYCOL 3350 17 G PO PACK: 17.0000 g | PACK | Freq: Two times a day (BID) | ORAL | Status: DC

## 2014-02-27 MED ORDER — SENNOSIDES-DOCUSATE SODIUM 8.6-50 MG PO TABS: 2.0000 | ORAL_TABLET | Freq: Two times a day (BID) | ORAL | Status: DC

## 2014-02-27 MED ORDER — METHOCARBAMOL 500 MG PO TABS
500.0000 mg | ORAL_TABLET | Freq: Four times a day (QID) | ORAL | Status: DC | PRN
Start: 2014-02-27 — End: 2015-06-04

## 2014-02-27 NOTE — Progress Notes (Signed)
ANTICOAGULATION CONSULT NOTE - Follow Up Consult  Pharmacy Consult for coumadin Indication: VTE prophylaxis  Allergies  Allergen Reactions  . Ciprofloxacin Nausea Only    Patient Measurements:   Heparin Dosing Weight:   Vital Signs: Temp: 98 F (36.7 C) (08/25 0522) Temp src: Oral (08/25 0522) BP: 116/68 mmHg (08/25 0522) Pulse Rate: 60 (08/25 0522)  Labs:  Recent Labs  02/24/14 0819 02/25/14 0315 02/26/14 0614  LABPROT 23.0* 21.8* 22.0*  INR 2.04* 1.90* 1.92*    The CrCl is unknown because both a height and weight (above a minimum accepted value) are required for this calculation.   Medications:  Scheduled:  . iron polysaccharides  150 mg Oral BID  . lisinopril  20 mg Oral q morning - 10a  . pantoprazole  40 mg Oral Daily  . polyethylene glycol  17 g Oral BID  . senna-docusate  2 tablet Oral BID  . sodium phosphate  1 enema Rectal Once  . Warfarin - Pharmacist Dosing Inpatient   Does not apply q1800   Infusions:    Assessment: 68 yo male s/p TKA is currently on subtherapeutic coumadin.  INR today is 1.89, but expecting it to come up with this higher dose. Goal of Therapy:  INR 2-3 Monitor platelets by anticoagulation protocol: Yes   Plan:  - coumadin 7.5mg  po x1 - INR in am  James Atkinson, James Atkinson 02/27/2014,7:59 AM

## 2014-02-27 NOTE — Progress Notes (Signed)
Social Work Discharge Note Discharge Note  The overall goal for the admission was met for:   Discharge location: Yes-HOME WITH WIFE WHO WILL BE THERE FIRST WEEK THEN RETURN TO WORK  Length of Stay: Yes-8 DAYS  Discharge activity level: Yes-MOD/I LEVEL  Home/community participation: Yes  Services provided included: MD, RD, PT, OT, RN, CM, TR, Pharmacy and SW  Financial Services: Medicare and Private Insurance: [place]  Follow-up services arranged: Home Health: [facility]-PT,OT,RN and Patient/Family request agency HH: [facility], DME: NO NEEDS  Comments (or additional information):PT DID WELL AND REACHED HIS GOALS-WIFE TO BE WITH FIRST WEEK FOR TRANSITION  Patient/Family verbalized understanding of follow-up arrangements: Yes  Individual responsible for coordination of the follow-up plan: SELF & MARTHA-WIFE  Confirmed correct DME delivered: Elease Hashimoto 02/27/2014    Elease Hashimoto

## 2014-02-27 NOTE — Discharge Instructions (Signed)
Inpatient Rehab Discharge Instructions  DANTHONY KENDRIX Discharge date and time: 02/27/14   Activities/Precautions/ Functional Status: Activity: activity as tolerated No driving Diet: regular diet Wound Care: keep wound clean and dry Functional status:  ___ No restrictions     ___ Walk up steps independently ___ 24/7 supervision/assistance   ___ Walk up steps with assistance _X__ Intermittent supervision/assistance  ___ Bathe/dress independently _X__ Walk with walker    ___ Bathe/dress with assistance ___ Walk Independently    ___ Shower independently ___ Walk with assistance    ___ Shower with assistance _X__ No alcohol     ___ Return to work/school ________   Special Instructions: 1. Wear immobilizer for comfort as needed.  2. Use CPM 3 hours twice a day and increase by 5-10 degrees daily for goal of 90 degrees. 3. Wear support stocking daily for at least 3 weeks to help control swelling.    COMMUNITY REFERRALS UPON DISCHARGE:    Home Health:   PT, OT, [place] BXUXY:[phone] Date of last service:02/27/2014  Medical Equipment/Items Ordered:HAS ALREADY     My questions have been answered and I understand these instructions. I will adhere to these goals and the provided educational materials after my discharge from the hospital.  Patient/Caregiver Signature _______________________________ Date __________  Clinician Signature _______________________________________ Date __________  Please bring this form and your medication list with you to all your follow-up doctor's appointments.

## 2014-02-27 NOTE — Progress Notes (Signed)
[facility]     PROGRESS NOTE    Subjective/Complaints: At 0-80 deg CPM No new issues overnite Review of Systems - Negative except B Knee pain  Objective: Vital Signs: Blood pressure 116/68, pulse 60, temperature 98 F (36.7 C), temperature source Oral, resp. rate 18, SpO2 98.00%. No results found. No results found for this basename: WBC, HGB, HCT, PLT,  in the last 72 hours No results found for this basename: NA, K, CL, CO, GLUCOSE, BUN, CREATININE, CALCIUM,  in the last 72 hours CBG (last 3)  No results found for this basename: GLUCAP,  in the last 72 hours  Wt Readings from Last 3 Encounters:  02/14/14 70.308 kg (155 lb)  02/14/14 70.308 kg (155 lb)  02/05/14 70.308 kg (155 lb)    Physical Exam:  Constitutional: He is oriented to person, place, and time. He appears well-developed and well-nourished.  HENT: oral mucosa pink and moist  Head: Normocephalic and atraumatic.  Right Ear: External ear normal.  Left Ear: External ear normal.  Eyes: Conjunctivae and EOM are normal. Pupils are equal, round, and reactive to light.  Neck: Normal range of motion. Neck supple.  Cardiovascular: Normal rate, regular rhythm and normal heart sounds. No murmurs or rubs  Respiratory: Effort normal and breath sounds normal. No wheezes or rales  GI: Soft. He exhibits distension.  Musculoskeletal:  Bilateral Knee with mild effusion on Right , PROM to about 70 degrees of flexion,extension to -20 deg min  able to perform SLR Neurological: He is alert and oriented to person, place, and time. UES 5/5. LE: HF 3/5, KE 3-/5, ADF/APF 4+. Normal sensory exam. CN exam normal.  Skin: Skin is warm and dry. Wounds are dry/intact/well-approximated Psychiatric: He has a normal mood and affect    Assessment/Plan: 1. Functional deficits secondary to bilateral TKA's due to endstage OA of knees which require 3+ hours per day of interdisciplinary therapy in a comprehensive  inpatient rehab setting. Physiatrist is providing close team supervision and 24 hour management of active medical problems listed below. Physiatrist and rehab team continue to assess barriers to discharge/monitor patient progress toward functional and medical goals.   FIM: FIM - Bathing Bathing Steps Patient Completed: Chest;Right Arm;Left Arm;Abdomen;Left upper leg;Right upper leg;Buttocks;Left lower leg (including foot);Right lower leg (including foot);Front perineal area Bathing: 6: More than reasonable amount of time  FIM - Upper Body Dressing/Undressing Upper body dressing/undressing steps patient completed: Thread/unthread right sleeve of pullover shirt/dresss;Thread/unthread left sleeve of pullover shirt/dress;Put head through opening of pull over shirt/dress;Pull shirt over trunk Upper body dressing/undressing: 6: More than reasonable amount of time FIM - Lower Body Dressing/Undressing Lower body dressing/undressing steps patient completed: Thread/unthread right underwear leg;Thread/unthread left underwear leg;Pull underwear up/down;Thread/unthread right pants leg;Thread/unthread left pants leg;Pull pants up/down;Fasten/unfasten left shoe;Fasten/unfasten right shoe;Don/Doff right sock;Don/Doff left sock;Don/Doff right shoe;Don/Doff left shoe Lower body dressing/undressing: 6: More than reasonable amount of time  FIM - Toileting Toileting steps completed by patient: Adjust clothing prior to toileting;Performs perineal hygiene;Adjust clothing after toileting Toileting Assistive Devices: Grab bar or rail for support Toileting: 6: More than reasonable amount of time  FIM - Radio producer Devices: Grab bars;Walker Toilet Transfers: 6-More than reasonable amt of time  FIM - Control and instrumentation engineer Devices: Copy: 6: Bed > Chair or W/C: No assist;6: Chair or W/C > Bed: No assist  FIM - Locomotion:  Wheelchair Distance: 150 Locomotion: Wheelchair: 6: Travels 150 ft or more, turns around,  maneuvers to table, bed or toilet, negotiates 3% grade: maneuvers on rugs and over door sills independently FIM - Locomotion: Ambulation Locomotion: Ambulation Assistive Devices: Walker - Rolling Ambulation/Gait Assistance: 6: Modified independent (Device/Increase time) Locomotion: Ambulation: 6: Travels 150 ft or more with assistive device/no helper  Comprehension Comprehension Mode: Auditory Comprehension: 7-Follows complex conversation/direction: With no assist  Expression Expression Mode: Verbal Expression: 7-Expresses complex ideas: With no assist  Social Interaction Social Interaction: 7-Interacts appropriately with others - No medications needed.  Problem Solving Problem Solving: 7-Solves complex problems: Recognizes & self-corrects  Memory Memory: 7-Complete Independence: No helper  Medical Problem List and Plan:  1. Functional deficits secondary to Bilateral Knee DJD.  2. DVT Prophylaxis/Anticoagulation: Pharmaceutical: Coumadin and Lovenox till INR therapeutic  3. Pain Management: Will continue oxycodone prn. Ice in between therapy sessions.  4. Mood: LCSW to follow for evaluation and support.  5. Neuropsych: This patient is capable of making decisions on his own behalf.  6. Skin/Wound Care: Monitor wound daily. Will add nutritional supplement for wound healing.  7. HTN: Will monitor BP every 8 hours. Hold lisinopril for SBP less than 120. Push po fluids.  8. ABLA: Continue iron supplement. hgb up to 8.1  9. Constipation: Will  miralax bid,senna , titrate as bowels function start becoming regular LOS (Days) 8 A FACE TO FACE EVALUATION WAS PERFORMED  Charlett Blake 02/27/2014 6:41 AM

## 2014-02-27 NOTE — Progress Notes (Signed)
Pt. Got d/c instructions and follow up appointments,pt. Ready to go home with family.

## 2014-03-15 DIAGNOSIS — D649 Anemia, unspecified: Secondary | ICD-10-CM | POA: Diagnosis not present

## 2014-03-15 DIAGNOSIS — M1990 Unspecified osteoarthritis, unspecified site: Secondary | ICD-10-CM | POA: Diagnosis not present

## 2014-03-15 DIAGNOSIS — I10 Essential (primary) hypertension: Secondary | ICD-10-CM | POA: Diagnosis not present

## 2014-03-15 DIAGNOSIS — Z7901 Long term (current) use of anticoagulants: Secondary | ICD-10-CM | POA: Diagnosis not present

## 2014-03-26 ENCOUNTER — Ambulatory Visit: Payer: PRIVATE HEALTH INSURANCE | Admitting: Internal Medicine

## 2014-04-26 DIAGNOSIS — H43812 Vitreous degeneration, left eye: Secondary | ICD-10-CM | POA: Diagnosis not present

## 2014-04-26 DIAGNOSIS — H524 Presbyopia: Secondary | ICD-10-CM | POA: Diagnosis not present

## 2014-07-31 DIAGNOSIS — Z96652 Presence of left artificial knee joint: Secondary | ICD-10-CM | POA: Diagnosis not present

## 2014-07-31 DIAGNOSIS — Z471 Aftercare following joint replacement surgery: Secondary | ICD-10-CM | POA: Diagnosis not present

## 2014-07-31 DIAGNOSIS — Z96651 Presence of right artificial knee joint: Secondary | ICD-10-CM | POA: Diagnosis not present

## 2014-08-06 DIAGNOSIS — Z125 Encounter for screening for malignant neoplasm of prostate: Secondary | ICD-10-CM | POA: Diagnosis not present

## 2014-08-06 DIAGNOSIS — R739 Hyperglycemia, unspecified: Secondary | ICD-10-CM | POA: Diagnosis not present

## 2014-08-06 DIAGNOSIS — M1990 Unspecified osteoarthritis, unspecified site: Secondary | ICD-10-CM | POA: Diagnosis not present

## 2014-08-06 DIAGNOSIS — D649 Anemia, unspecified: Secondary | ICD-10-CM | POA: Diagnosis not present

## 2014-08-06 DIAGNOSIS — E785 Hyperlipidemia, unspecified: Secondary | ICD-10-CM | POA: Diagnosis not present

## 2014-08-06 DIAGNOSIS — I10 Essential (primary) hypertension: Secondary | ICD-10-CM | POA: Diagnosis not present

## 2014-12-10 ENCOUNTER — Other Ambulatory Visit: Payer: Self-pay | Admitting: Internal Medicine

## 2015-01-04 DIAGNOSIS — Z1389 Encounter for screening for other disorder: Secondary | ICD-10-CM | POA: Diagnosis not present

## 2015-01-04 DIAGNOSIS — Z418 Encounter for other procedures for purposes other than remedying health state: Secondary | ICD-10-CM | POA: Diagnosis not present

## 2015-01-04 DIAGNOSIS — Z23 Encounter for immunization: Secondary | ICD-10-CM | POA: Diagnosis not present

## 2015-01-04 DIAGNOSIS — I10 Essential (primary) hypertension: Secondary | ICD-10-CM | POA: Diagnosis not present

## 2015-01-04 DIAGNOSIS — E785 Hyperlipidemia, unspecified: Secondary | ICD-10-CM | POA: Diagnosis not present

## 2015-01-04 DIAGNOSIS — N529 Male erectile dysfunction, unspecified: Secondary | ICD-10-CM | POA: Diagnosis not present

## 2015-01-04 DIAGNOSIS — R739 Hyperglycemia, unspecified: Secondary | ICD-10-CM | POA: Diagnosis not present

## 2015-02-13 DIAGNOSIS — Z96653 Presence of artificial knee joint, bilateral: Secondary | ICD-10-CM | POA: Diagnosis not present

## 2015-02-13 DIAGNOSIS — Z96651 Presence of right artificial knee joint: Secondary | ICD-10-CM | POA: Diagnosis not present

## 2015-02-13 DIAGNOSIS — Z96652 Presence of left artificial knee joint: Secondary | ICD-10-CM | POA: Diagnosis not present

## 2015-02-13 DIAGNOSIS — Z471 Aftercare following joint replacement surgery: Secondary | ICD-10-CM | POA: Diagnosis not present

## 2015-03-26 ENCOUNTER — Ambulatory Visit: Payer: Medicare Other | Admitting: Internal Medicine

## 2015-06-04 ENCOUNTER — Encounter: Payer: Self-pay | Admitting: Internal Medicine

## 2015-06-04 ENCOUNTER — Ambulatory Visit (INDEPENDENT_AMBULATORY_CARE_PROVIDER_SITE_OTHER): Payer: BLUE CROSS/BLUE SHIELD | Admitting: Internal Medicine

## 2015-06-04 VITALS — BP 114/60 | HR 64 | Ht 65.75 in | Wt 157.2 lb

## 2015-06-04 DIAGNOSIS — R1310 Dysphagia, unspecified: Secondary | ICD-10-CM | POA: Diagnosis not present

## 2015-06-04 DIAGNOSIS — Z86010 Personal history of colonic polyps: Secondary | ICD-10-CM

## 2015-06-04 NOTE — Patient Instructions (Addendum)
  You have been scheduled for a Barium Esophogram at [facility] (1st floor of the hospital) on 06/06/2015 at 11:00AM. Please arrive 15 minutes prior to your appointment for registration. Make certain not to have anything to eat or drink 6 hours prior to your test. If you need to reschedule for any reason, please contact radiology at [phone] to do so. __________________________________________________________________ A barium swallow is an examination that concentrates on views of the esophagus. This tends to be a double contrast exam (barium and two liquids which, when combined, create a gas to distend the wall of the oesophagus) or single contrast (non-ionic iodine based). The study is usually tailored to your symptoms so a good history is essential. Attention is paid during the study to the form, structure and configuration of the esophagus, looking for functional disorders (such as aspiration, dysphagia, achalasia, motility and reflux) EXAMINATION You may be asked to change into a gown, depending on the type of swallow being performed. A radiologist and radiographer will perform the procedure. The radiologist will advise you of the type of contrast selected for your procedure and direct you during the exam. You will be asked to stand, sit or lie in several different positions and to hold a small amount of fluid in your mouth before being asked to swallow while the imaging is performed .In some instances you may be asked to swallow barium coated marshmallows to assess the motility of a solid food bolus. The exam can be recorded as a digital or video fluoroscopy procedure. POST PROCEDURE It will take 1-2 days for the barium to pass through your system. To facilitate this, it is important, unless otherwise directed, to increase your fluids for the next 24-48hrs and to resume your normal diet.  This test typically takes about 30 minutes to  perform. __________________________________________________________________________________   I appreciate the opportunity to care for you. Silvano Rusk, MD, [facility]

## 2015-06-04 NOTE — Progress Notes (Signed)
   Subjective:    Patient ID: James Atkinson, male    DOB: 1945/07/31, 69 y.o.   MRN: [id number] Cc: dysphagia HPI Was eating hamburger meat and a small piece "went the wrong way"  I could breathe out but not in - my wife had to help me until I coughed it up - vry scary Had happened 3 x before i dilated his esophagus in 2015 and this is only x since - no esophgealdysphagia  Also hx colon polyps - all hyper plastic but some were right-sided and last colonoscopy 2011 - received letter from [place]  About to go away on 2 week vacation Medications, allergies, past medical history, past surgical history, family history and social history are reviewed and updated in the EMR.  Review of Systems As above    Objective:   Physical Exam BP 114/60 mmHg  Pulse 64  Ht 5' 5.75" (1.67 m)  Wt 157 lb 4 oz (71.328 kg)  BMI 25.58 kg/m2 NAD Neck supple no mass Mouth and pharynx NL Appropriate mood/affect    Assessment & Plan:  Dysphagia  Hx of colonic polyps  1) Modified Ba swallow to assess dysphagia - sounds oropharyngeal ? Aspiration 2) I think reasonable to do a colonoscopy given right-sided serrated lesions in 20111  Once MBS back will decide on next steps - colonoscopy vs colonoscopy/EGD possible dili  I appreciate the opportunity to care for this patient.  [id number], Gwyndolyn Saxon, MD

## 2015-06-05 ENCOUNTER — Other Ambulatory Visit (HOSPITAL_COMMUNITY): Payer: Self-pay | Admitting: Internal Medicine

## 2015-06-05 DIAGNOSIS — R1310 Dysphagia, unspecified: Secondary | ICD-10-CM

## 2015-06-06 ENCOUNTER — Ambulatory Visit (HOSPITAL_COMMUNITY)
Admission: RE | Admit: 2015-06-06 | Discharge: 2015-06-06 | Disposition: A | Discharge status: Home / Self Care | Payer: Medicare Other | Source: Ambulatory Visit | Attending: Internal Medicine | Admitting: Internal Medicine

## 2015-06-06 ENCOUNTER — Ambulatory Visit (HOSPITAL_COMMUNITY)
Admission: RE | Admit: 2015-06-06 | Discharge: 2015-06-06 | Disposition: A | Discharge status: Home / Self Care | Payer: BLUE CROSS/BLUE SHIELD | Source: Ambulatory Visit | Attending: Internal Medicine | Admitting: Internal Medicine

## 2015-06-06 DIAGNOSIS — R1310 Dysphagia, unspecified: Secondary | ICD-10-CM

## 2015-06-06 DIAGNOSIS — K222 Esophageal obstruction: Secondary | ICD-10-CM | POA: Diagnosis not present

## 2015-06-06 NOTE — Procedures (Signed)
Objective Swallowing Evaluation: MBS-Modified Barium Swallow Study  Patient Details  Name: James Atkinson MRN: [id number] Date of Birth: Sep 26, 1945  Today's Date: 06/06/2015 Time: SLP Start Time (ACUTE ONLY): 1100-SLP Stop Time (ACUTE ONLY): 1133 SLP Time Calculation (min) (ACUTE ONLY): 33 min  Past Medical History:  Past Medical History  Diagnosis Date  . Allergy   . Colon polyp   . Hypercholesteremia     diet controlled  . Hypertension   . GERD (gastroesophageal reflux disease)   . History of shingles   . Hemorrhoids     hx of  . Pneumonia 2012    hx of  . H/O measles     as child  . H/O mumps     as child  . Arthritis     thumbs, knees, back  . Complication of anesthesia     "hard to wake up after colonoscopy"  . Ringing in ears    Past Surgical History:  Past Surgical History  Procedure Laterality Date  . Rotator cuff repair Left 7 years ago  . Knee arthroscopy Left 23 years ago  . Colonoscopy      x2  . Esophageal dilation    . Hemorrhoid banding      x8   . Nasal septum surgery  25 years ago  . Tonsillectomy  2nd grade  . Hernia repair Bilateral 2012  . Total knee arthroplasty Bilateral 02/14/2014    Procedure: BILATERAL TOTAL KNEE ARTHROPLASTY;  Surgeon: Gearlean Alf, MD;  Location: [facility];  Service: Orthopedics;  Laterality: Bilateral;   HPI: Was eating hamburger meat and a small piece "went the wrong way" Was eating hamburger meat and a small piece "went the wrong way"   Subjective: Pt was fully ambulatory and brought self to exam. Pt able to answer all biographical and historical questions for exam.  Assessment / Plan / Recommendation  CHL IP CLINICAL IMPRESSIONS 06/06/2015  Therapy Diagnosis Suspected primary esophageal dysphagia  Clinical Impression Pt was tested with thin, puree, cracker and pill. Pt had no oral or pharyngeal dysphagia. However, pt did exhibit a tight UES with significant residue with all consistencies after  the swallow. While no aspiration was observed, it is likely that fibrous foods and hard foods (i.e.) nuts would be aspirated due to the reduced opening of the UES. Recomnend pt follow up with GI doctor.   Impact on safety and function Moderate aspiration risk      No flowsheet data found.   Prognosis 06/06/2015  Prognosis for Safe Diet Advancement Good  Barriers to Reach Goals --  Barriers/Prognosis Comment --    CHL IP DIET RECOMMENDATION 06/06/2015  SLP Diet Recommendations Regular solids;Thin liquid;Other (Comment)  Liquid Administration via --  Medication Administration Whole meds with liquid  Compensations Small sips/bites;Effortful swallow  Postural Changes Remain semi-upright after after feeds/meals (Comment)      CHL IP OTHER RECOMMENDATIONS 06/06/2015  Recommended Consults Consider GI evaluation  Oral Care Recommendations --  Other Recommendations --               CHL IP PHARYNGEAL PHASE 06/06/2015  Pharyngeal Phase WFL  Pharyngeal- Pudding Teaspoon --  Pharyngeal --  Pharyngeal- Pudding Cup --  Pharyngeal --  Pharyngeal- Honey Teaspoon --  Pharyngeal --  Pharyngeal- Honey Cup --  Pharyngeal --  Pharyngeal- Nectar Teaspoon --  Pharyngeal --  Pharyngeal- Nectar Cup --  Pharyngeal --  Pharyngeal- Nectar  Straw --  Pharyngeal --  Pharyngeal- Thin Teaspoon --  Pharyngeal --  Pharyngeal- Thin Cup --  Pharyngeal --  Pharyngeal- Thin Straw --  Pharyngeal --  Pharyngeal- Puree --  Pharyngeal --  Pharyngeal- Mechanical Soft --  Pharyngeal --  Pharyngeal- Regular --  Pharyngeal --  Pharyngeal- Multi-consistency --  Pharyngeal --  Pharyngeal- Pill --  Pharyngeal --  Pharyngeal Comment --     CHL IP CERVICAL ESOPHAGEAL PHASE 06/06/2015  Cervical Esophageal Phase Impaired  Pudding Teaspoon --  Pudding Cup --  Honey Teaspoon --  Honey Cup --  Nectar Teaspoon --  Nectar Cup --  Nectar Straw --  Thin Teaspoon --  Thin Cup --  Thin Straw --  Puree --   Mechanical Soft --  Regular --  Multi-consistency --  Pill --  Cervical Esophageal Comment Pt has a tight UES and as a result also has significant residue in pharyngeal area with all consistencies after the swallow.    Charlynne Cousins Phi Avans, MA, CCC-SLP 06/06/2015 12:40 PM

## 2015-06-07 NOTE — Progress Notes (Signed)
Quick Note:  MBS did not show aspiration but suggests recurrent distal stricture so he needs: 1) EGD/dili 2) Colonoscopy hx polyps  LEC ok ______

## 2015-07-10 DIAGNOSIS — E785 Hyperlipidemia, unspecified: Secondary | ICD-10-CM | POA: Diagnosis not present

## 2015-07-10 DIAGNOSIS — M1990 Unspecified osteoarthritis, unspecified site: Secondary | ICD-10-CM | POA: Diagnosis not present

## 2015-07-10 DIAGNOSIS — I10 Essential (primary) hypertension: Secondary | ICD-10-CM | POA: Diagnosis not present

## 2015-07-10 DIAGNOSIS — Z23 Encounter for immunization: Secondary | ICD-10-CM | POA: Diagnosis not present

## 2015-07-10 DIAGNOSIS — R739 Hyperglycemia, unspecified: Secondary | ICD-10-CM | POA: Diagnosis not present

## 2015-07-23 ENCOUNTER — Ambulatory Visit (AMBULATORY_SURGERY_CENTER): Payer: Self-pay

## 2015-07-23 VITALS — Ht 66.5 in | Wt 157.2 lb

## 2015-07-23 DIAGNOSIS — Z8719 Personal history of other diseases of the digestive system: Secondary | ICD-10-CM

## 2015-07-23 DIAGNOSIS — Z86010 Personal history of colonic polyps: Secondary | ICD-10-CM

## 2015-07-23 NOTE — Progress Notes (Signed)
Per pt, no allergies to soy or egg products.Pt not taking any weight loss meds or using  O2 at home. 

## 2015-08-07 ENCOUNTER — Ambulatory Visit (AMBULATORY_SURGERY_CENTER): Payer: BLUE CROSS/BLUE SHIELD | Admitting: Internal Medicine

## 2015-08-07 ENCOUNTER — Encounter: Payer: Self-pay | Admitting: Internal Medicine

## 2015-08-07 VITALS — BP 100/59 | HR 50 | Temp 97.8°F | Resp 23 | Ht 66.5 in | Wt 157.0 lb

## 2015-08-07 DIAGNOSIS — K219 Gastro-esophageal reflux disease without esophagitis: Secondary | ICD-10-CM | POA: Diagnosis not present

## 2015-08-07 DIAGNOSIS — I10 Essential (primary) hypertension: Secondary | ICD-10-CM | POA: Diagnosis not present

## 2015-08-07 DIAGNOSIS — Z86010 Personal history of colonic polyps: Secondary | ICD-10-CM

## 2015-08-07 DIAGNOSIS — K222 Esophageal obstruction: Secondary | ICD-10-CM | POA: Diagnosis not present

## 2015-08-07 DIAGNOSIS — R1310 Dysphagia, unspecified: Secondary | ICD-10-CM | POA: Diagnosis not present

## 2015-08-07 MED ORDER — SODIUM CHLORIDE 0.9 % IV SOLN: 500.0000 mL | INTRAVENOUS | Status: DC

## 2015-08-07 NOTE — Progress Notes (Signed)
Report to PACU, RN, vss, BBS= Clear.  

## 2015-08-07 NOTE — Progress Notes (Signed)
Called to room to assist during endoscopic procedure.  Patient ID and intended procedure confirmed with present staff. Received instructions for my participation in the procedure from the performing physician.  

## 2015-08-07 NOTE — Op Note (Signed)
[place]  [address] [place], [id number]   COLONOSCOPY PROCEDURE REPORT  PATIENT: James Atkinson, James Atkinson  MR#: [id number] BIRTHDATE: 03/21/1946 , 87  yrs. old GENDER: male ENDOSCOPIST: Gatha Mayer, MD, [facility] PROCEDURE DATE:  08/07/2015 PROCEDURE:   Colonoscopy, surveillance First Screening Colonoscopy - Avg.  risk and is 50 yrs.  old or older - No.      History of Adenoma - Now for follow-up colonoscopy & has been > or = to 3 yrs.  N/A  Polyps removed today? No Recommend repeat exam, <10 yrs? No ASA CLASS:   Class II INDICATIONS:Surveillance due to prior colonic neoplasia and PH Colon Adenoma. MEDICATIONS: Propofol 50 mg IV, Monitored anesthesia care, and Residual sedation present  DESCRIPTION OF PROCEDURE:   After the risks benefits and alternatives of the procedure were thoroughly explained, informed consent was obtained.  The digital rectal exam revealed no abnormalities of the rectum.   The LB [id number]  endoscope was introduced through the anus and advanced to the cecum, which was identified by both the appendix and ileocecal valve. No adverse events experienced.   The quality of the prep was excellent. (MiraLax was used)  The instrument was then slowly withdrawn as the colon was fully examined. Estimated blood loss is zero unless otherwise noted in this procedure report.      COLON FINDINGS: There was moderate diverticulosis noted throughout the entire examined colon.   The examination was otherwise normal. Retroflexed views revealed no abnormalities. The time to cecum = 3.5 Withdrawal time = 10.7   The scope was withdrawn and the procedure completed. COMPLICATIONS: There were no immediate complications.  ENDOSCOPIC IMPRESSION: 1.   Moderate diverticulosis was noted throughout the entire examined colon 2.   The examination was otherwise normal - excellent prep - hx right side serrated polyps 5 yrs ago  RECOMMENDATIONS: Repeat colonoscopy  10 years if fit (will be 9)  eSigned:  Gatha Mayer, MD, [facility] 08/07/2015 10:30 AM   cc: The Patient  and W. Azalia Bilis

## 2015-08-07 NOTE — Op Note (Signed)
[place]  [address] [place], [id number]   ENDOSCOPY PROCEDURE REPORT  PATIENT: James Atkinson, James Atkinson  MR#: [id number] BIRTHDATE: 12-Apr-1946 , 89  yrs. old GENDER: male ENDOSCOPIST: Gatha Mayer, MD, [facility] PROCEDURE DATE:  08/07/2015 PROCEDURE:  Esophagoscopy and Maloney dilation of esophagus ASA CLASS:     Class II INDICATIONS:  dysphagia. MEDICATIONS: Propofol 150 mg IV and Monitored anesthesia care TOPICAL ANESTHETIC: none  DESCRIPTION OF PROCEDURE: After the risks benefits and alternatives of the procedure were thoroughly explained, informed consent was obtained.  The LB [id number] endoscope was introduced through the mouth and advanced to the second portion of the duodenum , Without limitations.  The instrument was slowly withdrawn as the mucosa was fully examined.    1) Muscular ring at GE junction - dilated w/ 69 Fr Maloney - good effect seen. 2) Otherwise normal.  Venia Minks used because of stenosis at UES suspected at modified barium swallow.  Retroflexed views revealed no abnormalities.     The scope was then withdrawn from the patient and the procedure completed.  COMPLICATIONS: There were no immediate complications.  ENDOSCOPIC IMPRESSION: 1) Muscular ring at GE junction - dilated w/ 50 Fr Maloney - good effect seen. 2) Otherwise normal.  Venia Minks used because of stenosis at UES suspected at modified barium swallow  RECOMMENDATIONS: Clear liquids until1100  , then soft foods rest of day.  Resume prior diet tomorrow.   eSigned:  Gatha Mayer, MD, [facility] 08/07/2015 10:22 AM    CC:The Patient and Minette Brine, MD

## 2015-08-07 NOTE — Patient Instructions (Addendum)
I dilated the esophagus a different way - I hope that solves your swallowing problems.  No polyps in the colon. You also have a condition called diverticulosis - common and not usually a problem. Please read the handout provided.  Next routine colonoscopy in 10 years - 2027 - if fit at 47 so maybe.  I appreciate the opportunity to care for you. Gatha Mayer, MD, [facility]    discharge instructions given. Handouts on a dilatation diet and diverticulosis. Resume previous medications. YOU HAD AN ENDOSCOPIC PROCEDURE TODAY AT [facility]:   Refer to the procedure report that was given to you for any specific questions about what was found during the examination.  If the procedure report does not answer your questions, please call your gastroenterologist to clarify.  If you requested that your care partner not be given the details of your procedure findings, then the procedure report has been included in a sealed envelope for you to review at your convenience later.  YOU SHOULD EXPECT: Some feelings of bloating in the abdomen. Passage of more gas than usual.  Walking can help get rid of the air that was put into your GI tract during the procedure and reduce the bloating. If you had a lower endoscopy (such as a colonoscopy or flexible sigmoidoscopy) you may notice spotting of blood in your stool or on the toilet paper. If you underwent a bowel prep for your procedure, you may not have a normal bowel movement for a few days.  Please Note:  You might notice some irritation and congestion in your nose or some drainage.  This is from the oxygen used during your procedure.  There is no need for concern and it should clear up in a day or so.  SYMPTOMS TO REPORT IMMEDIATELY:   Following lower endoscopy (colonoscopy or flexible sigmoidoscopy):  Excessive amounts of blood in the stool  Significant tenderness or worsening of abdominal pains  Swelling of the abdomen that is new,  acute  Fever of 100F or higher   Following upper endoscopy (EGD)  Vomiting of blood or coffee ground material  New chest pain or pain under the shoulder blades  Painful or persistently difficult swallowing  New shortness of breath  Fever of 100F or higher  Black, tarry-looking stools  For urgent or emergent issues, a gastroenterologist can be reached at any hour by calling [phone].   DIET: Your first meal following the procedure should be a small meal and then it is ok to progress to your normal diet. Heavy or fried foods are harder to digest and may make you feel nauseous or bloated.  Likewise, meals heavy in dairy and vegetables can increase bloating.  Drink plenty of fluids but you should avoid alcoholic beverages for 24 hours.  ACTIVITY:  You should plan to take it easy for the rest of today and you should NOT DRIVE or use heavy machinery until tomorrow (because of the sedation medicines used during the test).    FOLLOW UP: Our staff will call the number listed on your records the next business day following your procedure to check on you and address any questions or concerns that you may have regarding the information given to you following your procedure. If we do not reach you, we will leave a message.  However, if you are feeling well and you are not experiencing any problems, there is no need to return our call.  We will assume that you have  returned to your regular daily activities without incident.  If any biopsies were taken you will be contacted by phone or by letter within the next 1-3 weeks.  Please call us at [phone] if you have not heard about the biopsies in 3 weeks.    SIGNATURES/CONFIDENTIALITY: You and/or your care partner have signed paperwork which will be entered into your electronic medical record.  These signatures attest to the fact that that the information above on your After Visit Summary has been reviewed and is understood.  Full responsibility  of the confidentiality of this discharge information lies with you and/or your care-partner.

## 2015-08-08 ENCOUNTER — Telehealth: Payer: Self-pay

## 2015-08-08 NOTE — Telephone Encounter (Signed)
  Follow up Call-  Call back number 08/07/2015 01/08/2014  Post procedure Call Back phone  # [phone] cell [phone]  Permission to leave phone message Yes Yes     Patient questions:  Do you have a fever, pain , or abdominal swelling? No. Pain Score  0 *  Have you tolerated food without any problems? Yes.    Have you been able to return to your normal activities? Yes.    Do you have any questions about your discharge instructions: Diet   No. Medications  No. Follow up visit  No.  Do you have questions or concerns about your Care? No.  Actions: * If pain score is 4 or above: No action needed, pain <4.

## 2015-09-05 ENCOUNTER — Other Ambulatory Visit: Payer: Self-pay | Admitting: Internal Medicine

## 2015-09-16 DIAGNOSIS — E785 Hyperlipidemia, unspecified: Secondary | ICD-10-CM | POA: Diagnosis not present

## 2015-11-04 ENCOUNTER — Ambulatory Visit (INDEPENDENT_AMBULATORY_CARE_PROVIDER_SITE_OTHER): Payer: BLUE CROSS/BLUE SHIELD | Admitting: Family Medicine

## 2015-11-04 VITALS — BP 118/78 | HR 62 | Temp 98.2°F | Resp 17 | Ht 66.5 in | Wt 156.0 lb

## 2015-11-04 DIAGNOSIS — S86811A Strain of other muscle(s) and tendon(s) at lower leg level, right leg, initial encounter: Secondary | ICD-10-CM | POA: Diagnosis not present

## 2015-11-04 NOTE — Patient Instructions (Addendum)
IF you received an x-ray today, you will receive an invoice from [facility]. Please contact [facility] at [phone] with questions or concerns regarding your invoice.   IF you received labwork today, you will receive an invoice from [employer]. Please contact [facility] at [phone] with questions or concerns regarding your invoice.   Our billing staff will not be able to assist you with questions regarding bills from these companies.  You will be contacted with the lab results as soon as they are available. The fastest way to get your results is to activate your My Chart account. Instructions are located on the last page of this paperwork. If you have not heard from [place] regarding the results in 2 weeks, please contact this office.    Body Helix.  Please look at your calf sleeve.  Call with any questions.    Medial Head Gastrocnemius Tear With Rehab Medial head gastrocnemius tear, also called tennis leg, is a tear (strain) in a muscle or tendon of the inner portion (medial head) of one of the calf muscles (gastrocnemius). The inner portion of the calf muscle attaches to the thigh bone (femur) and is responsible for bending the knee and straightening the foot (standing "on tiptoe"). Strains are classified into three categories. Grade 1 strains cause pain, but the tendon is not lengthened. Grade 2 strains include a lengthened ligament, due to the ligament being stretched or partially ruptured. With grade 2 strains there is still function, although function may be decreased. Grade 3 strains involve a complete tear of the tendon or muscle, and function is usually impaired. SYMPTOMS   Sudden "pop" or tear felt at the time of injury.  Pain, tenderness, swelling, warmth, or redness over the middle inner calf.  Pain and weakness with ankle motion, especially flexing the ankle against resistance, as well as pain with lifting up the foot (extending  the ankle).  Bruising (contusion) of the calf, heel, and sometimes the foot within 48 hours of injury.  Muscle spasm in the calf. CAUSES  Muscle and ligament strains occur when a force is placed on the muscle or ligament that is greater than it can handle. Common causes of injury include:  Direct hit (trauma) to the calf.  Sudden forceful pushing off or landing on the foot (jumping, landing, serving a tennis ball, lunging). RISK INCREASES WITH:  Sports that require sudden, explosive calf muscle contraction, such as those involving jumping (basketball), hill running, quick starts (running), or lunging (racquetball, tennis).  Contact sports (football, soccer, hockey).  Poor strength and flexibility.  Previous lower limb injury. PREVENTION  Warm up and stretch properly before activity.  Allow for adequate recovery between workouts.  Maintain physical fitness:  Strength, flexibility, and endurance.  Cardiovascular fitness.  Learn and use proper exercise technique.  Complete rehabilitation after lower limb injury, before returning to competition or practice. PROGNOSIS  If treated properly, tennis leg usually heals within 6 weeks of nonsurgical treatment.  RELATED COMPLICATIONS   Longer healing time, if not properly treated or if not given enough time to heal.  Recurring symptoms and injury, if activity is resumed too soon, with overuse, with a direct blow, or with poor technique.  If untreated, may progress to a complete tear (rare) or other injury, due to limping and favoring of the injured leg.  Persistent limping, due to scarring and shortening of the calf muscles, as a result of inadequate rehabilitation.  Prolonged disability. TREATMENT  Treatment first  involves the use of ice and medication to help reduce pain and inflammation. The use of strengthening and stretching exercises may help reduce pain with activity. These exercises may be performed at home or with a  therapist. For severe injuries, referral to a therapist may be needed for further evaluation and treatment. Your caregiver may advise that you wear a brace to help healing. Sometimes, crutches are needed until you can walk without limping. Rarely, surgery is needed.  MEDICATION   If pain medicine is needed, nonsteroidal anti-inflammatory medicines (aspirin and ibuprofen), or other minor pain relievers (acetaminophen), are often advised.  Do not take pain medicine for 7 days before surgery.  Prescription pain relievers may be given, if your caregiver thinks they are needed. Use only as directed and only as much as you need. HEAT AND COLD  Cold treatment (icing) should be applied for 10 to 15 minutes every 2 to 3 hours for inflammation and pain, and immediately after activity that aggravates your symptoms. Use ice packs or an ice massage.  Heat treatment may be used before performing stretching and strengthening activities prescribed by your caregiver, physical therapist, or athletic trainer. Use a heat James Atkinson or a warm water soak. SEEK MEDICAL CARE IF:   Symptoms get worse or do not improve in 2 weeks, despite treatment.  Numbness or tingling develops.  New, unexplained symptoms develop. (Drugs used in treatment may produce side effects.) EXERCISES  RANGE OF MOTION (ROM) AND STRETCHING EXERCISES - Medial Head Gastrocnemius Tear (Tennis Leg) These exercises may help you when beginning to rehabilitate your injury. Your symptoms may resolve with or without further involvement from your physician, physical therapist, or athletic trainer. While completing these exercises, remember:   Restoring tissue flexibility helps normal motion to return to the joints. This allows healthier, less painful movement and activity.  An effective stretch should be held for at least 30 seconds.  A stretch should never be painful. You should only feel a gentle lengthening or release in the stretched tissue. STRETCH  - Gastrocsoleus  Sit with your right / left leg extended. Holding onto both ends of a belt or towel, loop it around the ball of your foot.  Keeping your right / left ankle and foot relaxed and your knee straight, pull your foot and ankle toward you using the belt.  You should feel a gentle stretch behind your calf or knee. Hold this position for __________ seconds. Repeat __________ times. Complete this stretch __________ times per day.  RANGE OF MOTION - Ankle Dorsiflexion, Active Assisted   Remove your shoes and sit on a chair, preferably not on a carpeted surface.  Place your right / left foot directly under the knee. Extend your opposite leg for support.  Keeping your heel down, slide your right / left foot back toward the chair, until you feel a stretch at your ankle or calf. If you do not feel a stretch, slide your bottom forward to the edge of the chair, while still keeping your heel down.  Hold this stretch for __________ seconds. Repeat __________ times. Complete this stretch __________ times per day.  STRETCH - Gastroc, Standing   Place your hands on a wall.  Extend your right / left leg behind you, keeping the front knee somewhat bent.  Slightly point your toes inward on your back foot.  Keeping your right / left heel on the floor and your knee straight, shift your weight toward the wall, not allowing your back to arch.  You should feel a gentle stretch in the right / left calf. Hold this position for __________ seconds. Repeat __________ times. Complete this stretch __________ times per day. STRETCH - Soleus, Standing   Place your hands on a wall.  Extend your right / left leg behind you, keeping the other knee somewhat bent.  Point your toes of your back foot slightly inward.  Keep your right / left heel on the floor, bend your back knee, and slightly shift your weight over the back leg so that you feel a gentle stretch deep in your back calf.  Hold this position  for __________ seconds. Repeat __________ times. Complete this stretch __________ times per day. STRETCH - Gastrocsoleus, Standing Note: This exercise can place a lot of stress on your foot and ankle. Please complete this exercise only if specifically instructed by your caregiver.   Place the ball of your right / left foot on a step, keeping your other foot firmly on the same step.  Hold on to the wall or a rail for balance.  Slowly lift your other foot, allowing your body weight to press your heel down over the edge of the step.  You should feel a stretch in your right / left calf.  Hold this position for __________ seconds.  Repeat this exercise with a slight bend in your right / left knee. Repeat __________ times. Complete this stretch __________ times per day.  STRENGTHENING EXERCISES - Medial Head Gastrocnemius Tear (Tennis Leg) These exercises may help you when beginning to rehabilitate your injury. They may resolve your symptoms with or without further involvement from your physician, physical therapist, or athletic trainer. While completing these exercises, remember:   Muscles can gain both the endurance and the strength needed for everyday activities through controlled exercises.  Complete these exercises as instructed by your physician, physical therapist, or athletic trainer. Increase the resistance and repetitions only as guided by your caregiver. STRENGTH - Plantar-flexors  Sit with your right / left leg extended. Holding onto both ends of a rubber exercise band or tubing, loop it around the ball of your foot. Keep a slight tension in the band.  Slowly push your toes away from you, pointing them downward.  Hold this position for __________ seconds. Return slowly, controlling the tension in the band. Repeat __________ times. Complete this exercise __________ times per day.  STRENGTH - Plantar-flexors  Stand with your feet shoulder width apart. Steady yourself with a wall  or table, using as little support as needed.  Keeping your weight evenly spread over the width of your feet, rise up on your toes.*  Hold this position for __________ seconds. Repeat __________ times. Complete this exercise __________ times per day.  *If this is too easy, shift your weight toward your right / left leg until you feel challenged. Ultimately, you may be asked to do this exercise while standing on your right / left foot only. STRENGTH - Plantar-flexors, Eccentric Note: This exercise can place a lot of stress on your foot and ankle. Please complete this exercise only if specifically instructed by your caregiver.   Place the balls of your feet on a step. With your hands, use only enough support from a wall or rail to keep your balance.  Keep your knees straight and rise up on your toes.  Slowly shift your weight entirely to your right / left toes and pick up your opposite foot. Gently and with controlled movement, lower your weight through your right /  left foot so that your heel drops below the level of the step. You will feel a slight stretch in the back of your right / left calf.  Use the healthy leg to help rise up onto the balls of both feet, then lower weight only onto the right / left leg again. Build up to 15 repetitions. Then progress to 3 sets of 15 repetitions.*  After completing the above exercise, complete the same exercise with a slight knee bend (about 30 degrees). Again, build up to 15 repetitions. Then progress to 3 sets of 15 repetitions.* Perform this exercise __________ times per day.  *When you easily complete 3 sets of 15, your physician, physical therapist, or athletic trainer may advise you to add resistance, by wearing a backpack filled with additional weight.   This information is not intended to replace advice given to you by your health care provider. Make sure you discuss any questions you have with your health care provider.   Document Released:  06/22/2005 Document Revised: 07/13/2014 Document Reviewed: 10/04/2008 Elsevier Interactive Patient Education [employer].

## 2015-11-04 NOTE — Progress Notes (Signed)
[place] [facility] [address] [place], [place] [address] [phone]  Date:  11/04/2015   Name:  James Atkinson   DOB:  27-Oct-1945   MRN:  [id number]  PCP:  Shirline Frees, MD    Chief Complaint: Knee Injury   History of Present Illness:  James Atkinson is a 70 y.o. very pleasant male patient who presents with the following: Right calf pain:  - Large bruise on medial calf - 1 week ago - Walking down step, missed a step and stretched out.  - No therapies tried as on vacation and taking care of step dad.  - 50% better in the last week.  - NO dyspnea.  - history of b/l total knees. Doing wonderfully.  - Currently working in Associate Professor.  - APAP helps - Taking asa   Patient Active Problem List   Diagnosis Date Noted  . Unspecified constipation 02/26/2014  . HTN (hypertension) 02/26/2014  . Postoperative anemia due to acute blood loss 02/16/2014  . OA (osteoarthritis) of knee 02/14/2014  . Esophageal dysphagia 01/03/2014    Past Medical History  Diagnosis Date  . Allergy   . Colon polyp   . Hypercholesteremia     diet controlled  . Hypertension   . GERD (gastroesophageal reflux disease)   . History of shingles   . Hemorrhoids     hx of  . Pneumonia 2012    hx of  . H/O measles     as child  . H/O mumps     as child  . Arthritis     thumbs, knees, back  . Complication of anesthesia     "hard to wake up after colonoscopy"  . Ringing in ears     Past Surgical History  Procedure Laterality Date  . Rotator cuff repair Left 7 years ago  . Knee arthroscopy Left 23 years ago  . Colonoscopy      x2  . Esophageal dilation    . Hemorrhoid banding      x8   . Nasal septum surgery  25 years ago  . Tonsillectomy  2nd grade  . Hernia repair Bilateral 2012  . Total knee arthroplasty Bilateral 02/14/2014    Procedure: BILATERAL TOTAL KNEE ARTHROPLASTY;  Surgeon: Gearlean Alf, MD;  Location: [facility];  Service: Orthopedics;  Laterality: Bilateral;  . Bilataterl total knee replacement      Social History  Substance Use Topics  . Smoking status: Former Smoker -- 1.00 packs/day for 10 years    Types: Cigarettes    Quit date: 07/06/1988  . Smokeless tobacco: Former Systems developer    Types: Chew    Quit date: 07/06/1988  . Alcohol Use: No    Family History  Problem Relation Age of Onset  . COPD Father   . Liver cancer Mother   . Lung cancer Paternal Grandfather   . Prostate cancer Paternal Grandfather   . Asthma Brother   . Colon cancer Neg Hx   . Esophageal cancer Neg Hx   . Rectal cancer Neg Hx   . Stomach cancer Neg Hx     Allergies  Allergen Reactions  . Ciprofloxacin Nausea Only    Medication list has been reviewed and updated.  Current Outpatient Prescriptions on File Prior to Visit  Medication Sig Dispense Refill  . Ascorbic Acid (VITAMIN C) 1000 MG tablet Take 1,000 mg by mouth daily.    Marland Kitchen  aspirin 81 MG tablet Take 81 mg by mouth daily.    . cholecalciferol (VITAMIN D) 1000 UNITS tablet Take 1,000 Units by mouth daily.    Marland Kitchen CINNAMON PO Take 4,000 mg by mouth daily.    Marland Kitchen lisinopril (PRINIVIL,ZESTRIL) 20 MG tablet Take 20 mg by mouth every morning.     . Multiple Vitamin (MULTIVITAMIN) capsule Take 1 capsule by mouth daily.    . Omega-3 Fatty Acids (FISH OIL) 1000 MG CAPS Take 4,000 mg by mouth daily.    . pantoprazole (PROTONIX) 40 MG tablet TAKE 1 TABLET DAILY 90 tablet 1  . vitamin E (VITAMIN E) 400 UNIT capsule Take 400 Units by mouth daily.     No current facility-administered medications on file prior to visit.    Review of Systems:  Review of Systems  Constitutional: Negative for fever, chills and malaise/fatigue.  Respiratory: Negative for sputum production and shortness of breath.   Cardiovascular: Negative for chest pain.  Gastrointestinal: Negative for nausea, vomiting, diarrhea and constipation.  Musculoskeletal: Negative for myalgias, back pain,  joint pain and neck pain.  Skin: Negative for itching and rash.  Neurological: Negative for tingling.  Endo/Heme/Allergies: Does not bruise/bleed easily.    Physical Examination: Filed Vitals:   11/04/15 0912  BP: 118/78  Pulse: 62  Temp: 98.2 F (36.8 C)  Resp: 17   Filed Vitals:   11/04/15 0912  Height: 5' 6.5" (1.689 m)  Weight: 156 lb (70.761 kg)   Body mass index is 24.8 kg/(m^2). Ideal Body Weight: Weight in (lb) to have BMI = 25: 156.9  GEN: WDWN, NAD, Non-toxic, A & O x 3 HEENT: Atraumatic, Normocephalic.  PULM: non-labored breathing. CTAB. NEURO Normal gait, minimally antalgic.  PSYCH: Normally interactive. Conversant. Not depressed or anxious appearing.  Calm demeanor.   Ankle/calf/knee: right Bruising medial proximal calf and plantar aspect of the foot. Trace LE edema. 'Knot' in medial gastroc. Mild TTP.  Range of motion is full in all directions for ankle and knee. Strength is 5/5 in all directions with ankle/knee (pain with resisted plantarflexion). Stable lateral and medial ligaments; squeeze test and kleiger test unremarkable; Ankle non-tender. Negative Thompson Able to walk 4 steps.   Assessment and Plan: Medial Gastroc strain: 50 % improvement since injury last week - PRICE - Gentle strectching - Body Helix compression - f/u PRN.   Signed Gerre Pebbles, MD

## 2016-01-08 DIAGNOSIS — M542 Cervicalgia: Secondary | ICD-10-CM | POA: Diagnosis not present

## 2016-01-08 DIAGNOSIS — I10 Essential (primary) hypertension: Secondary | ICD-10-CM | POA: Diagnosis not present

## 2016-01-08 DIAGNOSIS — E785 Hyperlipidemia, unspecified: Secondary | ICD-10-CM | POA: Diagnosis not present

## 2016-01-08 DIAGNOSIS — R7309 Other abnormal glucose: Secondary | ICD-10-CM | POA: Diagnosis not present

## 2016-02-26 ENCOUNTER — Telehealth: Payer: Self-pay | Admitting: Internal Medicine

## 2016-02-26 MED ORDER — PANTOPRAZOLE SODIUM 40 MG PO TBEC: 40.0000 mg | DELAYED_RELEASE_TABLET | Freq: Every day | ORAL | 1 refills | Status: DC

## 2016-02-26 NOTE — Telephone Encounter (Signed)
  Pantoprazole sent in for 90 day supply as requested to [facility].

## 2016-06-24 ENCOUNTER — Ambulatory Visit (INDEPENDENT_AMBULATORY_CARE_PROVIDER_SITE_OTHER): Payer: BLUE CROSS/BLUE SHIELD | Admitting: Physician Assistant

## 2016-06-24 VITALS — BP 112/68 | HR 60 | Temp 98.3°F | Resp 16 | Ht 66.5 in | Wt 154.8 lb

## 2016-06-24 DIAGNOSIS — S29019A Strain of muscle and tendon of unspecified wall of thorax, initial encounter: Secondary | ICD-10-CM

## 2016-06-24 MED ORDER — MELOXICAM 7.5 MG PO TABS: 7.5000 mg | ORAL_TABLET | Freq: Every day | ORAL | 0 refills | Status: AC

## 2016-06-24 MED ORDER — CYCLOBENZAPRINE HCL 5 MG PO TABS: 5.0000 mg | ORAL_TABLET | Freq: Three times a day (TID) | ORAL | 0 refills | Status: DC | PRN

## 2016-06-24 MED ORDER — CYCLOBENZAPRINE HCL 5 MG PO TABS
5.0000 mg | ORAL_TABLET | Freq: Three times a day (TID) | ORAL | 0 refills | Status: DC | PRN
Start: 2016-06-24 — End: 2019-02-20

## 2016-06-24 MED ORDER — MELOXICAM 7.5 MG PO TABS: 7.5000 mg | ORAL_TABLET | Freq: Every day | ORAL | 0 refills | Status: DC

## 2016-06-24 NOTE — Progress Notes (Signed)
[facility] [address] [place] [address] [phone]  Date:  06/24/2016   Name:  James Atkinson   DOB:  [birth year]   MRN:  [id number]  PCP:  Shirline Frees, MD    History of Present Illness:  James Atkinson is a 70 y.o. male patient [facility]   --Left lower back pain for several days. Tenderness on the rib cage. Patient states that he was shaking hands when he felt backwards. His son-in-law caught him by grabbing his arm which jerked his side. He did not fill pop. There is no shortness of breath or trouble breathing. No urinary complaints.   Patient Active Problem List   Diagnosis Date Noted  . Unspecified constipation 02/26/2014  . HTN (hypertension) 02/26/2014  . Postoperative anemia due to acute blood loss 02/16/2014  . OA (osteoarthritis) of knee 02/14/2014  . Esophageal dysphagia 01/03/2014    Past Medical History:  Diagnosis Date  . Allergy   . Arthritis    thumbs, knees, back  . Colon polyp   . Complication of anesthesia    "hard to wake up after colonoscopy"  . GERD (gastroesophageal reflux disease)   . H/O measles    as child  . H/O mumps    as child  . Hemorrhoids    hx of  . History of shingles   . Hypercholesteremia    diet controlled  . Hypertension   . Pneumonia 2012   hx of  . Ringing in ears     Past Surgical History:  Procedure Laterality Date  . bilataterl total knee replacement    . COLONOSCOPY     x2  . ESOPHAGEAL DILATION    . HEMORRHOID BANDING     x8   . HERNIA REPAIR Bilateral 2012  . KNEE ARTHROSCOPY Left 23 years ago  . NASAL SEPTUM SURGERY  25 years ago  . ROTATOR CUFF REPAIR Left 7 years ago  . TONSILLECTOMY  2nd grade  . TOTAL KNEE ARTHROPLASTY Bilateral 02/14/2014   Procedure: BILATERAL TOTAL KNEE ARTHROPLASTY;  Surgeon: Gearlean Alf, MD;  Location: [facility];  Service: Orthopedics;  Laterality: Bilateral;    Social History  Substance  Use Topics  . Smoking status: Former Smoker    Packs/day: 1.00    Years: 10.00    Types: Cigarettes    Quit date: 07/06/1988  . Smokeless tobacco: Former Systems developer    Types: Chew    Quit date: 07/06/1988  . Alcohol use No    Family History  Problem Relation Age of Onset  . COPD Father   . Liver cancer Mother   . Lung cancer Paternal Grandfather   . Prostate cancer Paternal Grandfather   . Asthma Brother   . Colon cancer Neg Hx   . Esophageal cancer Neg Hx   . Rectal cancer Neg Hx   . Stomach cancer Neg Hx     Allergies  Allergen Reactions  . Ciprofloxacin Nausea Only    Medication list has been reviewed and updated.  Current Outpatient Prescriptions on File Prior to Visit  Medication Sig Dispense Refill  . Ascorbic Acid (VITAMIN C) 1000 MG tablet Take 1,000 mg by mouth daily.    Marland Kitchen aspirin 81 MG tablet Take 81 mg by mouth daily.    Marland Kitchen atorvastatin (LIPITOR) 20 MG tablet Take 20 mg by mouth daily.    . cholecalciferol (VITAMIN D) 1000 UNITS tablet  Take 1,000 Units by mouth daily.    Marland Kitchen CINNAMON PO Take 4,000 mg by mouth daily.    Marland Kitchen lisinopril (PRINIVIL,ZESTRIL) 20 MG tablet Take 20 mg by mouth every morning.     . Multiple Vitamin (MULTIVITAMIN) capsule Take 1 capsule by mouth daily.    . Omega-3 Fatty Acids (FISH OIL) 1000 MG CAPS Take 4,000 mg by mouth daily.    . pantoprazole (PROTONIX) 40 MG tablet Take 40 mg by mouth daily before breakfast.    . vitamin E (VITAMIN E) 400 UNIT capsule Take 400 Units by mouth daily.     No current facility-administered medications on file prior to visit.     ROS ROS otherwise unremarkable unless listed above.  Physical Examination: BP 112/68   Pulse 60   Temp 98.3 F (36.8 C) (Oral)   Resp 16   Ht 5' 6.5" (1.689 m)   Wt 154 lb 12.8 oz (70.2 kg)   SpO2 97%   BMI 24.61 kg/m  Ideal Body Weight: Weight in (lb) to have BMI = 25: 156.9  Physical Exam  Constitutional: He is oriented to person, place, and time. He appears well-developed  and well-nourished. No distress.  HENT:  Head: Normocephalic and atraumatic.  Eyes: Conjunctivae and EOM are normal. Pupils are equal, round, and reactive to light.  Cardiovascular: Normal rate.   Pulmonary/Chest: Effort normal. No respiratory distress. He has no wheezes.  Musculoskeletal:       Thoracic back: He exhibits normal range of motion, no tenderness and no bony tenderness.  Left-sided costal rib pain without spasm or contusions. There is no swelling. Pain incited with left-sided torso rotation.  Neurological: He is alert and oriented to person, place, and time.  Skin: Skin is warm and dry. He is not diaphoretic.  Psychiatric: He has a normal mood and affect. His behavior is normal.     Assessment and Plan: James Atkinson is a 70 y.o. male who is here today for chief complaint of left lower back pain. Advise short-term low-dose NSAID use daily. Precaution of using other NSAIDs were discussed. Given muscle relaxant. Advised of sedation state of properties. Return to clinic as needed. Stretches ice advice was also given. Thoracic myofascial strain, initial encounter - Plan: cyclobenzaprine (FLEXERIL) 5 MG tablet, DISCONTINUED: cyclobenzaprine (FLEXERIL) 5 MG tablet   Ivar Drape, PA-C [facility] 06/24/2016 12:10 PM

## 2016-06-24 NOTE — Patient Instructions (Addendum)
  IF you received an x-ray today, you will receive an invoice from [facility]. Please contact [facility] at [phone] with questions or concerns regarding your invoice.   IF you received labwork today, you will receive an invoice from [facility]. Please contact [facility] at [phone] with questions or concerns regarding your invoice.   Our billing staff will not be able to assist you with questions regarding bills from these companies.  You will be contacted with the lab results as soon as they are available. The fastest way to get your results is to activate your My Chart account. Instructions are located on the last page of this paperwork. If you have not heard from [place] regarding the results in 2 weeks, please contact this office.     Please ice the rib three times per day for 15 minutes.  Do light stretches of the rib cage.   If you take the mobic, do not take ibuprofen or naproxen.   Muscle relaxant will make you sedated, so be careful with the medication (flexeril).  Do not drive with this medication.

## 2016-07-05 ENCOUNTER — Encounter: Payer: Self-pay | Admitting: Physician Assistant

## 2016-07-13 DIAGNOSIS — K219 Gastro-esophageal reflux disease without esophagitis: Secondary | ICD-10-CM | POA: Diagnosis not present

## 2016-07-13 DIAGNOSIS — Z23 Encounter for immunization: Secondary | ICD-10-CM | POA: Diagnosis not present

## 2016-07-13 DIAGNOSIS — Z125 Encounter for screening for malignant neoplasm of prostate: Secondary | ICD-10-CM | POA: Diagnosis not present

## 2016-07-13 DIAGNOSIS — R7301 Impaired fasting glucose: Secondary | ICD-10-CM | POA: Diagnosis not present

## 2016-07-13 DIAGNOSIS — E785 Hyperlipidemia, unspecified: Secondary | ICD-10-CM | POA: Diagnosis not present

## 2016-07-13 DIAGNOSIS — L299 Pruritus, unspecified: Secondary | ICD-10-CM | POA: Diagnosis not present

## 2016-07-13 DIAGNOSIS — I10 Essential (primary) hypertension: Secondary | ICD-10-CM | POA: Diagnosis not present

## 2016-08-24 ENCOUNTER — Other Ambulatory Visit: Payer: Self-pay | Admitting: Internal Medicine

## 2016-12-16 DIAGNOSIS — H6692 Otitis media, unspecified, left ear: Secondary | ICD-10-CM | POA: Diagnosis not present

## 2016-12-16 DIAGNOSIS — H903 Sensorineural hearing loss, bilateral: Secondary | ICD-10-CM | POA: Diagnosis not present

## 2016-12-16 DIAGNOSIS — H6062 Unspecified chronic otitis externa, left ear: Secondary | ICD-10-CM | POA: Diagnosis not present

## 2016-12-16 DIAGNOSIS — H906 Mixed conductive and sensorineural hearing loss, bilateral: Secondary | ICD-10-CM | POA: Diagnosis not present

## 2016-12-16 DIAGNOSIS — H902 Conductive hearing loss, unspecified: Secondary | ICD-10-CM | POA: Diagnosis not present

## 2016-12-16 DIAGNOSIS — H6121 Impacted cerumen, right ear: Secondary | ICD-10-CM | POA: Diagnosis not present

## 2016-12-28 DIAGNOSIS — H6061 Unspecified chronic otitis externa, right ear: Secondary | ICD-10-CM | POA: Diagnosis not present

## 2016-12-28 DIAGNOSIS — H6691 Otitis media, unspecified, right ear: Secondary | ICD-10-CM | POA: Diagnosis not present

## 2016-12-28 DIAGNOSIS — H6121 Impacted cerumen, right ear: Secondary | ICD-10-CM | POA: Diagnosis not present

## 2017-01-22 DIAGNOSIS — R7303 Prediabetes: Secondary | ICD-10-CM | POA: Diagnosis not present

## 2017-01-22 DIAGNOSIS — K219 Gastro-esophageal reflux disease without esophagitis: Secondary | ICD-10-CM | POA: Diagnosis not present

## 2017-01-22 DIAGNOSIS — Z1389 Encounter for screening for other disorder: Secondary | ICD-10-CM | POA: Diagnosis not present

## 2017-01-22 DIAGNOSIS — E785 Hyperlipidemia, unspecified: Secondary | ICD-10-CM | POA: Diagnosis not present

## 2017-01-22 DIAGNOSIS — I10 Essential (primary) hypertension: Secondary | ICD-10-CM | POA: Diagnosis not present

## 2017-01-22 DIAGNOSIS — R011 Cardiac murmur, unspecified: Secondary | ICD-10-CM | POA: Diagnosis not present

## 2017-01-22 DIAGNOSIS — D696 Thrombocytopenia, unspecified: Secondary | ICD-10-CM | POA: Diagnosis not present

## 2017-01-25 DIAGNOSIS — H6063 Unspecified chronic otitis externa, bilateral: Secondary | ICD-10-CM | POA: Diagnosis not present

## 2017-01-25 DIAGNOSIS — H6122 Impacted cerumen, left ear: Secondary | ICD-10-CM | POA: Diagnosis not present

## 2017-02-08 DIAGNOSIS — R011 Cardiac murmur, unspecified: Secondary | ICD-10-CM | POA: Diagnosis not present

## 2017-02-16 DIAGNOSIS — H6522 Chronic serous otitis media, left ear: Secondary | ICD-10-CM | POA: Diagnosis not present

## 2017-02-16 DIAGNOSIS — H903 Sensorineural hearing loss, bilateral: Secondary | ICD-10-CM | POA: Diagnosis not present

## 2017-02-20 ENCOUNTER — Other Ambulatory Visit: Payer: Self-pay | Admitting: Internal Medicine

## 2017-03-01 DIAGNOSIS — R011 Cardiac murmur, unspecified: Secondary | ICD-10-CM | POA: Diagnosis not present

## 2017-03-05 DIAGNOSIS — H524 Presbyopia: Secondary | ICD-10-CM | POA: Diagnosis not present

## 2017-03-05 DIAGNOSIS — H2513 Age-related nuclear cataract, bilateral: Secondary | ICD-10-CM | POA: Diagnosis not present

## 2017-06-22 DIAGNOSIS — J069 Acute upper respiratory infection, unspecified: Secondary | ICD-10-CM | POA: Diagnosis not present

## 2017-07-30 DIAGNOSIS — R7303 Prediabetes: Secondary | ICD-10-CM | POA: Diagnosis not present

## 2017-07-30 DIAGNOSIS — Z125 Encounter for screening for malignant neoplasm of prostate: Secondary | ICD-10-CM | POA: Diagnosis not present

## 2017-07-30 DIAGNOSIS — E7800 Pure hypercholesterolemia, unspecified: Secondary | ICD-10-CM | POA: Diagnosis not present

## 2017-07-30 DIAGNOSIS — I10 Essential (primary) hypertension: Secondary | ICD-10-CM | POA: Diagnosis not present

## 2017-07-30 DIAGNOSIS — K219 Gastro-esophageal reflux disease without esophagitis: Secondary | ICD-10-CM | POA: Diagnosis not present

## 2017-07-30 DIAGNOSIS — D696 Thrombocytopenia, unspecified: Secondary | ICD-10-CM | POA: Diagnosis not present

## 2018-02-02 DIAGNOSIS — R7303 Prediabetes: Secondary | ICD-10-CM | POA: Diagnosis not present

## 2018-02-02 DIAGNOSIS — K219 Gastro-esophageal reflux disease without esophagitis: Secondary | ICD-10-CM | POA: Diagnosis not present

## 2018-02-02 DIAGNOSIS — I10 Essential (primary) hypertension: Secondary | ICD-10-CM | POA: Diagnosis not present

## 2018-02-02 DIAGNOSIS — Z0000 Encounter for general adult medical examination without abnormal findings: Secondary | ICD-10-CM | POA: Diagnosis not present

## 2018-02-02 DIAGNOSIS — E7800 Pure hypercholesterolemia, unspecified: Secondary | ICD-10-CM | POA: Diagnosis not present

## 2018-02-02 DIAGNOSIS — D538 Other specified nutritional anemias: Secondary | ICD-10-CM | POA: Diagnosis not present

## 2018-02-25 DIAGNOSIS — R7303 Prediabetes: Secondary | ICD-10-CM | POA: Diagnosis not present

## 2018-02-25 DIAGNOSIS — D538 Other specified nutritional anemias: Secondary | ICD-10-CM | POA: Diagnosis not present

## 2018-03-04 DIAGNOSIS — R011 Cardiac murmur, unspecified: Secondary | ICD-10-CM | POA: Diagnosis not present

## 2018-03-04 DIAGNOSIS — I119 Hypertensive heart disease without heart failure: Secondary | ICD-10-CM | POA: Diagnosis not present

## 2018-03-04 DIAGNOSIS — E785 Hyperlipidemia, unspecified: Secondary | ICD-10-CM | POA: Diagnosis not present

## 2018-03-04 DIAGNOSIS — K219 Gastro-esophageal reflux disease without esophagitis: Secondary | ICD-10-CM | POA: Diagnosis not present

## 2018-03-04 DIAGNOSIS — I349 Nonrheumatic mitral valve disorder, unspecified: Secondary | ICD-10-CM | POA: Diagnosis not present

## 2018-03-04 DIAGNOSIS — I340 Nonrheumatic mitral (valve) insufficiency: Secondary | ICD-10-CM | POA: Diagnosis not present

## 2018-03-08 DIAGNOSIS — H524 Presbyopia: Secondary | ICD-10-CM | POA: Diagnosis not present

## 2018-03-08 DIAGNOSIS — H40023 Open angle with borderline findings, high risk, bilateral: Secondary | ICD-10-CM | POA: Diagnosis not present

## 2018-03-08 DIAGNOSIS — E119 Type 2 diabetes mellitus without complications: Secondary | ICD-10-CM | POA: Diagnosis not present

## 2018-05-05 DIAGNOSIS — S61012A Laceration without foreign body of left thumb without damage to nail, initial encounter: Secondary | ICD-10-CM | POA: Diagnosis not present

## 2018-05-05 DIAGNOSIS — M79645 Pain in left finger(s): Secondary | ICD-10-CM | POA: Diagnosis not present

## 2018-05-05 DIAGNOSIS — Z23 Encounter for immunization: Secondary | ICD-10-CM | POA: Diagnosis not present

## 2018-05-14 DIAGNOSIS — J069 Acute upper respiratory infection, unspecified: Secondary | ICD-10-CM | POA: Diagnosis not present

## 2018-05-14 DIAGNOSIS — J040 Acute laryngitis: Secondary | ICD-10-CM | POA: Diagnosis not present

## 2018-05-14 DIAGNOSIS — R490 Dysphonia: Secondary | ICD-10-CM | POA: Diagnosis not present

## 2018-09-22 DIAGNOSIS — Z125 Encounter for screening for malignant neoplasm of prostate: Secondary | ICD-10-CM | POA: Diagnosis not present

## 2018-09-22 DIAGNOSIS — I10 Essential (primary) hypertension: Secondary | ICD-10-CM | POA: Diagnosis not present

## 2018-09-22 DIAGNOSIS — R7303 Prediabetes: Secondary | ICD-10-CM | POA: Diagnosis not present

## 2018-09-22 DIAGNOSIS — M533 Sacrococcygeal disorders, not elsewhere classified: Secondary | ICD-10-CM | POA: Diagnosis not present

## 2018-09-22 DIAGNOSIS — D696 Thrombocytopenia, unspecified: Secondary | ICD-10-CM | POA: Diagnosis not present

## 2018-09-22 DIAGNOSIS — B009 Herpesviral infection, unspecified: Secondary | ICD-10-CM | POA: Diagnosis not present

## 2018-09-22 DIAGNOSIS — K219 Gastro-esophageal reflux disease without esophagitis: Secondary | ICD-10-CM | POA: Diagnosis not present

## 2018-09-22 DIAGNOSIS — E7800 Pure hypercholesterolemia, unspecified: Secondary | ICD-10-CM | POA: Diagnosis not present

## 2019-02-06 ENCOUNTER — Ambulatory Visit: Payer: Medicare Other | Admitting: Cardiology

## 2019-02-20 ENCOUNTER — Encounter: Payer: Self-pay | Admitting: Cardiology

## 2019-02-20 ENCOUNTER — Ambulatory Visit (INDEPENDENT_AMBULATORY_CARE_PROVIDER_SITE_OTHER): Payer: BC Managed Care – PPO | Admitting: Cardiology

## 2019-02-20 ENCOUNTER — Other Ambulatory Visit: Payer: Self-pay

## 2019-02-20 VITALS — BP 110/64 | HR 56 | Resp 10 | Ht 66.5 in | Wt 149.1 lb

## 2019-02-20 DIAGNOSIS — E785 Hyperlipidemia, unspecified: Secondary | ICD-10-CM | POA: Diagnosis not present

## 2019-02-20 DIAGNOSIS — I340 Nonrheumatic mitral (valve) insufficiency: Secondary | ICD-10-CM | POA: Diagnosis not present

## 2019-02-20 DIAGNOSIS — I10 Essential (primary) hypertension: Secondary | ICD-10-CM | POA: Diagnosis not present

## 2019-02-20 NOTE — Progress Notes (Signed)
Cardiology Office Note:    Date:  02/20/2019   ID:  James Atkinson, DOB Jan 09, 1946, MRN [id number]  PCP:  Shirline Frees, MD  Cardiologist:  Jenne Campus, MD    Referring MD: Shirline Frees, MD   Chief Complaint  Patient presents with  . Follow-up  Am here for yearly follow-up  History of Present Illness:    James Atkinson is a 73 y.o. male with mitral regurgitation which was diagnosed 2 years ago last estimation of his mitral regurgitation was done at the end of August 2018 when he had moderate mitral regurgitation.  Left atrium was mildly enlarged.  Pulmonary artery pressure was normal.  He is doing well denies having any chest pain tightness squeezing pressure burning chest, no palpitations no swelling of lower extremities.  He still works as an Clinical biochemist and doing well he said that he had to slow down a little bit but overall still enjoying his work. Does not have any signs and symptoms of congestive heart failure.  Past Medical History:  Diagnosis Date  . Allergy   . Arthritis    thumbs, knees, back  . Colon polyp   . Complication of anesthesia    "hard to wake up after colonoscopy"  . GERD (gastroesophageal reflux disease)   . H/O measles    as child  . H/O mumps    as child  . Hemorrhoids    hx of  . History of shingles   . Hypercholesteremia    diet controlled  . Hypertension   . Pneumonia 2012   hx of  . Ringing in ears     Past Surgical History:  Procedure Laterality Date  . bilataterl total knee replacement    . COLONOSCOPY     x2  . ESOPHAGEAL DILATION    . HEMORRHOID BANDING     x8   . HERNIA REPAIR Bilateral 2012  . KNEE ARTHROSCOPY Left 23 years ago  . NASAL SEPTUM SURGERY  25 years ago  . ROTATOR CUFF REPAIR Left 7 years ago  . TONSILLECTOMY  2nd grade  . TOTAL KNEE ARTHROPLASTY Bilateral 02/14/2014   Procedure: BILATERAL TOTAL KNEE ARTHROPLASTY;  Surgeon: Gearlean Alf, MD;  Location: [facility];  Service: Orthopedics;  Laterality:  Bilateral;    Current Medications: Current Meds  Medication Sig  . Ascorbic Acid (VITAMIN C) 1000 MG tablet Take 1,000 mg by mouth daily.  Marland Kitchen aspirin 81 MG tablet Take 81 mg by mouth daily.  Marland Kitchen atorvastatin (LIPITOR) 40 MG tablet Take 40 mg by mouth daily.   . Cholecalciferol (VITAMIN D) 125 MCG (5000 UT) CAPS Take 5,000 Units by mouth daily.   . ferrous sulfate 324 MG TBEC Take 324 mg by mouth.  Marland Kitchen lisinopril (PRINIVIL,ZESTRIL) 20 MG tablet Take 20 mg by mouth every morning.   . Multiple Vitamin (MULTIVITAMIN) capsule Take 1 capsule by mouth daily.  . pantoprazole (PROTONIX) 40 MG tablet Take 40 mg by mouth once a week.   . Turmeric 500 MG CAPS Take 1 capsule by mouth daily.     Allergies:   Ciprofloxacin   Social History   Socioeconomic History  . Marital status: Married    Spouse name: Not on file  . Number of children: 2  . Years of education: Not on file  . Highest education level: Not on file  Occupational History  . Not on file  Social Needs  . Financial resource strain: Not on file  . Food insecurity  Worry: Not on file    Inability: Not on file  . Transportation needs    Medical: Not on file    Non-medical: Not on file  Tobacco Use  . Smoking status: Former Smoker    Packs/day: 1.00    Years: 10.00    Pack years: 10.00    Types: Cigarettes    Quit date: 07/06/1988    Years since quitting: 30.6  . Smokeless tobacco: Former Systems developer    Types: [place] date: 07/06/1988  Substance and Sexual Activity  . Alcohol use: No  . Drug use: No  . Sexual activity: Yes    Birth control/protection: None  Lifestyle  . Physical activity    Days per week: Not on file    Minutes per session: Not on file  . Stress: Not on file  Relationships  . Social Herbalist on phone: Not on file    Gets together: Not on file    Attends religious service: Not on file    Active member of club or organization: Not on file    Attends meetings of clubs or organizations: Not on  file    Relationship status: Not on file  Other Topics Concern  . Not on file  Social History Narrative   Married, 2 adult children, 2 grandchildren   Chief Financial Officer   No caffeine     Family History: The patient's family history includes Asthma in his brother; COPD in his father; Liver cancer in his mother; Lung cancer in his paternal grandfather; Prostate cancer in his paternal grandfather. There is no history of Colon cancer, Esophageal cancer, Rectal cancer, or Stomach cancer. ROS:   Please see the history of present illness.    All 14 point review of systems negative except as described per history of present illness  EKGs/Labs/Other Studies Reviewed:      Recent Labs: No results found for requested labs within last 8760 hours.  Recent Lipid Panel No results found for: CHOL, TRIG, HDL, CHOLHDL, VLDL, LDLCALC, LDLDIRECT  Physical Exam:    VS:  BP 110/64   Pulse (!) 56   Resp 10   Ht 5' 6.5" (1.689 m)   Wt 149 lb 1.9 oz (67.6 kg)   BMI 23.71 kg/m     Wt Readings from Last 3 Encounters:  02/20/19 149 lb 1.9 oz (67.6 kg)  06/24/16 154 lb 12.8 oz (70.2 kg)  11/04/15 156 lb (70.8 kg)     GEN:  Well nourished, well developed in no acute distress HEENT: Normal NECK: No JVD; No carotid bruits LYMPHATICS: No lymphadenopathy CARDIAC: RRR, holosystolic murmur best heard at apex with radiation to axilla grade 2/6 to 3/6., no rubs, no gallops RESPIRATORY:  Clear to auscultation without rales, wheezing or rhonchi  ABDOMEN: Soft, non-tender, non-distended MUSCULOSKELETAL:  No edema; No deformity  SKIN: Warm and dry LOWER EXTREMITIES: no swelling NEUROLOGIC:  Alert and oriented x 3 PSYCHIATRIC:  Normal affect   ASSESSMENT:    1. Essential hypertension   2. Moderate mitral regurgitation   3. Dyslipidemia    PLAN:    In order of problems listed above:  1. Essential hypertension blood pressure well controlled continue present management. 2. Moderate mitral  regurgitation.  He will be scheduled to have an echocardiogram to assess the lesion likely he is asymptomatic.  Obviously we will look at the left ventricle size and function. 3. Dyslipidemia on statin which I will continue follow-up by internal medicine  team.  Stable.   Medication Adjustments/Labs and Tests Ordered: Current medicines are reviewed at length with the patient today.  Concerns regarding medicines are outlined above.  No orders of the defined types were placed in this encounter.  Medication changes: No orders of the defined types were placed in this encounter.   Signed, Park Liter, MD, [facility] 02/20/2019 9:27 AM    Agency

## 2019-02-20 NOTE — Patient Instructions (Signed)
Medication Instructions:  Your physician recommends that you continue on your current medications as directed. Please refer to the Current Medication list given to you today.  If you need a refill on your cardiac medications before your next appointment, please call your pharmacy.   Lab work: NONE If you have labs (blood work) drawn today and your tests are completely normal, you will receive your results only by: Marland Kitchen MyChart Message (if you have MyChart) OR . A paper copy in the mail If you have any lab test that is abnormal or we need to change your treatment, we will call you to review the results.  Testing/Procedures: Your physician has requested that you have an echocardiogram. Echocardiography is a painless test that uses sound waves to create images of your heart. It provides your doctor with information about the size and shape of your heart and how well your heart's chambers and valves are working. This procedure takes approximately one hour. There are no restrictions for this procedure.   Echocardiogram An echocardiogram is a procedure that uses painless sound waves (ultrasound) to produce an image of the heart. Images from an echocardiogram can provide important information about:  Signs of coronary artery disease (CAD).  Aneurysm detection. An aneurysm is a weak or damaged part of an artery wall that bulges out from the normal force of blood pumping through the body.  Heart size and shape. Changes in the size or shape of the heart can be associated with certain conditions, including heart failure, aneurysm, and CAD.  Heart muscle function.  Heart valve function.  Signs of a past heart attack.  Fluid buildup around the heart.  Thickening of the heart muscle.  A tumor or infectious growth around the heart valves. Tell a health care provider about:  Any allergies you have.  All medicines you are taking, including vitamins, herbs, eye drops, creams, and over-the-counter  medicines.  Any blood disorders you have.  Any surgeries you have had.  Any medical conditions you have.  Whether you are pregnant or may be pregnant. What are the risks? Generally, this is a safe procedure. However, problems may occur, including:  Allergic reaction to dye (contrast) that may be used during the procedure. What happens before the procedure? No specific preparation is needed. You may eat and drink normally. What happens during the procedure?   An IV tube may be inserted into one of your veins.  You may receive contrast through this tube. A contrast is an injection that improves the quality of the pictures from your heart.  A gel will be applied to your chest.  A wand-like tool (transducer) will be moved over your chest. The gel will help to transmit the sound waves from the transducer.  The sound waves will harmlessly bounce off of your heart to allow the heart images to be captured in real-time motion. The images will be recorded on a computer. The procedure may vary among health care providers and hospitals. What happens after the procedure?  You may return to your normal, everyday life, including diet, activities, and medicines, unless your health care provider tells you not to do that. Summary  An echocardiogram is a procedure that uses painless sound waves (ultrasound) to produce an image of the heart.  Images from an echocardiogram can provide important information about the size and shape of your heart, heart muscle function, heart valve function, and fluid buildup around your heart.  You do not need to do anything to prepare  before this procedure. You may eat and drink normally.  After the echocardiogram is completed, you may return to your normal, everyday life, unless your health care provider tells you not to do that. This information is not intended to replace advice given to you by your health care provider. Make sure you discuss any questions you  have with your health care provider. Document Released: 06/19/2000 Document Revised: 10/13/2018 Document Reviewed: 07/25/2016 Elsevier Patient Education  [place]: At [facility], you and your health needs are our priority.  As part of our continuing mission to provide you with exceptional heart care, we have created designated Provider Care Teams.  These Care Teams include your primary Cardiologist (physician) and Advanced Practice Providers (APPs -  Physician Assistants and Nurse Practitioners) who all work together to provide you with the care you need, when you need it. . You will need a follow up appointment in 1 year  Any Other Special Instructions Will Be Listed Below (If Applicable).

## 2019-02-20 NOTE — Addendum Note (Signed)
Addended by: Polly Cobia A on: 02/20/2019 09:40 AM   Modules accepted: Orders

## 2019-02-21 ENCOUNTER — Ambulatory Visit (HOSPITAL_BASED_OUTPATIENT_CLINIC_OR_DEPARTMENT_OTHER)
Admission: RE | Admit: 2019-02-21 | Discharge: 2019-02-21 | Disposition: A | Discharge status: Home / Self Care | Payer: BC Managed Care – PPO | Source: Ambulatory Visit | Attending: Cardiology | Admitting: Cardiology

## 2019-02-21 DIAGNOSIS — I340 Nonrheumatic mitral (valve) insufficiency: Secondary | ICD-10-CM | POA: Diagnosis not present

## 2019-02-24 ENCOUNTER — Telehealth: Payer: Self-pay

## 2019-02-24 DIAGNOSIS — M109 Gout, unspecified: Secondary | ICD-10-CM | POA: Diagnosis not present

## 2019-02-24 NOTE — Telephone Encounter (Signed)
Left patient with a voicemail to call the office for echo results.

## 2019-04-07 DIAGNOSIS — H2513 Age-related nuclear cataract, bilateral: Secondary | ICD-10-CM | POA: Diagnosis not present

## 2019-04-07 DIAGNOSIS — H40023 Open angle with borderline findings, high risk, bilateral: Secondary | ICD-10-CM | POA: Diagnosis not present

## 2019-04-07 DIAGNOSIS — H524 Presbyopia: Secondary | ICD-10-CM | POA: Diagnosis not present

## 2019-05-03 DIAGNOSIS — R7303 Prediabetes: Secondary | ICD-10-CM | POA: Diagnosis not present

## 2019-05-03 DIAGNOSIS — M109 Gout, unspecified: Secondary | ICD-10-CM | POA: Diagnosis not present

## 2019-05-03 DIAGNOSIS — E7800 Pure hypercholesterolemia, unspecified: Secondary | ICD-10-CM | POA: Diagnosis not present

## 2019-05-03 DIAGNOSIS — M533 Sacrococcygeal disorders, not elsewhere classified: Secondary | ICD-10-CM | POA: Diagnosis not present

## 2019-05-03 DIAGNOSIS — I10 Essential (primary) hypertension: Secondary | ICD-10-CM | POA: Diagnosis not present

## 2019-05-03 DIAGNOSIS — K219 Gastro-esophageal reflux disease without esophagitis: Secondary | ICD-10-CM | POA: Diagnosis not present

## 2019-05-03 DIAGNOSIS — D696 Thrombocytopenia, unspecified: Secondary | ICD-10-CM | POA: Diagnosis not present

## 2019-05-03 DIAGNOSIS — Z0000 Encounter for general adult medical examination without abnormal findings: Secondary | ICD-10-CM | POA: Diagnosis not present

## 2019-05-13 DIAGNOSIS — M533 Sacrococcygeal disorders, not elsewhere classified: Secondary | ICD-10-CM | POA: Diagnosis not present

## 2019-05-13 DIAGNOSIS — R011 Cardiac murmur, unspecified: Secondary | ICD-10-CM | POA: Insufficient documentation

## 2019-11-02 DIAGNOSIS — E7800 Pure hypercholesterolemia, unspecified: Secondary | ICD-10-CM | POA: Diagnosis not present

## 2019-11-02 DIAGNOSIS — Z23 Encounter for immunization: Secondary | ICD-10-CM | POA: Diagnosis not present

## 2019-11-02 DIAGNOSIS — I10 Essential (primary) hypertension: Secondary | ICD-10-CM | POA: Diagnosis not present

## 2019-11-02 DIAGNOSIS — R7303 Prediabetes: Secondary | ICD-10-CM | POA: Diagnosis not present

## 2019-11-02 DIAGNOSIS — Z125 Encounter for screening for malignant neoplasm of prostate: Secondary | ICD-10-CM | POA: Diagnosis not present

## 2019-11-02 DIAGNOSIS — M542 Cervicalgia: Secondary | ICD-10-CM | POA: Diagnosis not present

## 2019-11-02 DIAGNOSIS — K219 Gastro-esophageal reflux disease without esophagitis: Secondary | ICD-10-CM | POA: Diagnosis not present

## 2020-03-06 ENCOUNTER — Other Ambulatory Visit: Payer: Self-pay

## 2020-03-06 ENCOUNTER — Ambulatory Visit (INDEPENDENT_AMBULATORY_CARE_PROVIDER_SITE_OTHER): Payer: BC Managed Care – PPO | Admitting: Cardiology

## 2020-03-06 ENCOUNTER — Encounter: Payer: Self-pay | Admitting: Cardiology

## 2020-03-06 VITALS — BP 108/68 | HR 70 | Ht 66.5 in | Wt 146.0 lb

## 2020-03-06 DIAGNOSIS — E785 Hyperlipidemia, unspecified: Secondary | ICD-10-CM | POA: Diagnosis not present

## 2020-03-06 DIAGNOSIS — I340 Nonrheumatic mitral (valve) insufficiency: Secondary | ICD-10-CM

## 2020-03-06 DIAGNOSIS — I10 Essential (primary) hypertension: Secondary | ICD-10-CM | POA: Diagnosis not present

## 2020-03-06 NOTE — Patient Instructions (Signed)
Medication Instructions:  Your physician recommends that you continue on your current medications as directed. Please refer to the Current Medication list given to you today.  *If you need a refill on your cardiac medications before your next appointment, please call your pharmacy*   Lab Work: None If you have labs (blood work) drawn today and your tests are completely normal, you will receive your results only by: . MyChart Message (if you have MyChart) OR . A paper copy in the mail If you have any lab test that is abnormal or we need to change your treatment, we will call you to review the results.   Testing/Procedures: Your physician has requested that you have an echocardiogram. Echocardiography is a painless test that uses sound waves to create images of your heart. It provides your doctor with information about the size and shape of your heart and how well your heart's chambers and valves are working. This procedure takes approximately one hour. There are no restrictions for this procedure.     Follow-Up: At [facility], you and your health needs are our priority.  As part of our continuing mission to provide you with exceptional heart care, we have created designated Provider Care Teams.  These Care Teams include your primary Cardiologist (physician) and Advanced Practice Providers (APPs -  Physician Assistants and Nurse Practitioners) who all work together to provide you with the care you need, when you need it.  We recommend signing up for the patient portal called "MyChart".  Sign up information is provided on this After Visit Summary.  MyChart is used to connect with patients for Virtual Visits (Telemedicine).  Patients are able to view lab/test results, encounter notes, upcoming appointments, etc.  Non-urgent messages can be sent to your provider as well.   To learn more about what you can do with MyChart, go to [email or web address].    Your next appointment:   1  year(s)  The format for your next appointment:   In Person  Provider:   Robert Krasowski, MD   Other Instructions   Echocardiogram An echocardiogram is a procedure that uses painless sound waves (ultrasound) to produce an image of the heart. Images from an echocardiogram can provide important information about:  Signs of coronary artery disease (CAD).  Aneurysm detection. An aneurysm is a weak or damaged part of an artery wall that bulges out from the normal force of blood pumping through the body.  Heart size and shape. Changes in the size or shape of the heart can be associated with certain conditions, including heart failure, aneurysm, and CAD.  Heart muscle function.  Heart valve function.  Signs of a past heart attack.  Fluid buildup around the heart.  Thickening of the heart muscle.  A tumor or infectious growth around the heart valves. Tell a health care provider about:  Any allergies you have.  All medicines you are taking, including vitamins, herbs, eye drops, creams, and over-the-counter medicines.  Any blood disorders you have.  Any surgeries you have had.  Any medical conditions you have.  Whether you are pregnant or may be pregnant. What are the risks? Generally, this is a safe procedure. However, problems may occur, including:  Allergic reaction to dye (contrast) that may be used during the procedure. What happens before the procedure? No specific preparation is needed. You may eat and drink normally. What happens during the procedure?   An IV tube may be inserted into one of your veins.  You may receive   contrast through this tube. A contrast is an injection that improves the quality of the pictures from your heart.  A gel will be applied to your chest.  A wand-like tool (transducer) will be moved over your chest. The gel will help to transmit the sound waves from the transducer.  The sound waves will harmlessly bounce off of your heart to  allow the heart images to be captured in real-time motion. The images will be recorded on a computer. The procedure may vary among health care providers and hospitals. What happens after the procedure?  You may return to your normal, everyday life, including diet, activities, and medicines, unless your health care provider tells you not to do that. Summary  An echocardiogram is a procedure that uses painless sound waves (ultrasound) to produce an image of the heart.  Images from an echocardiogram can provide important information about the size and shape of your heart, heart muscle function, heart valve function, and fluid buildup around your heart.  You do not need to do anything to prepare before this procedure. You may eat and drink normally.  After the echocardiogram is completed, you may return to your normal, everyday life, unless your health care provider tells you not to do that. This information is not intended to replace advice given to you by your health care provider. Make sure you discuss any questions you have with your health care provider. Document Revised: 10/13/2018 Document Reviewed: 07/25/2016 Elsevier Patient Education  2020 Elsevier Inc.   

## 2020-03-06 NOTE — Progress Notes (Signed)
Cardiology Office Note:    Date:  03/06/2020   ID:  James Atkinson, DOB 02/02/1946, MRN [id number]  PCP:  Shirline Frees, MD  Cardiologist:  Jenne Campus, MD    Referring MD: Shirline Frees, MD   Chief Complaint  Patient presents with  . Annual Exam  I am doing well  History of Present Illness:    James Atkinson is a 74 y.o. male with past medical history significant for moderate mitral regurgitation, however latest echocardiogram showed only mild, essential hypertension, dyslipidemia.  Comes today 2 months of follow-up.  Overall doing well.  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  Past Medical History:  Diagnosis Date  . Allergy   . Arthritis    thumbs, knees, back  . Colon polyp   . Complication of anesthesia    "hard to wake up after colonoscopy"  . GERD (gastroesophageal reflux disease)   . H/O measles    as child  . H/O mumps    as child  . Hemorrhoids    hx of  . History of shingles   . Hypercholesteremia    diet controlled  . Hypertension   . Pneumonia 2012   hx of  . Ringing in ears     Past Surgical History:  Procedure Laterality Date  . bilataterl total knee replacement    . COLONOSCOPY     x2  . ESOPHAGEAL DILATION    . HEMORRHOID BANDING     x8   . HERNIA REPAIR Bilateral 2012  . KNEE ARTHROSCOPY Left 23 years ago  . NASAL SEPTUM SURGERY  25 years ago  . ROTATOR CUFF REPAIR Left 7 years ago  . TONSILLECTOMY  2nd grade  . TOTAL KNEE ARTHROPLASTY Bilateral 02/14/2014   Procedure: BILATERAL TOTAL KNEE ARTHROPLASTY;  Surgeon: Gearlean Alf, MD;  Location: [facility];  Service: Orthopedics;  Laterality: Bilateral;    Current Medications: Current Meds  Medication Sig  . Ascorbic Acid (VITAMIN C) 1000 MG tablet Take 1,000 mg by mouth daily.  Marland Kitchen aspirin 81 MG tablet Take 81 mg by mouth daily.  Marland Kitchen atorvastatin (LIPITOR) 40 MG tablet Take 40 mg by  mouth daily.   . Cholecalciferol (VITAMIN D) 125 MCG (5000 UT) CAPS Take 5,000 Units by mouth daily.   . ferrous sulfate 324 MG TBEC Take 324 mg by mouth.  Marland Kitchen lisinopril (PRINIVIL,ZESTRIL) 20 MG tablet Take 20 mg by mouth every morning.   . Multiple Vitamin (MULTIVITAMIN) capsule Take 1 capsule by mouth daily.  . Omega-3 Fatty Acids (FISH OIL) 1000 MG CAPS Take 2,000 mg by mouth daily.  . Turmeric 500 MG CAPS Take 1 capsule by mouth daily.     Allergies:   Ciprofloxacin   Social History   Socioeconomic History  . Marital status: Married    Spouse name: Not on file  . Number of children: 2  . Years of education: Not on file  . Highest education level: Not on file  Occupational History  . Not on file  Tobacco Use  . Smoking status: Former Smoker    Packs/day: 1.00    Years: 10.00    Pack years: 10.00    Types: Cigarettes    Quit date: 07/06/1988    Years since quitting: 31.6  . Smokeless tobacco: Former Systems developer    Types: [place] date: 07/06/1988  Substance and Sexual Activity  .  Alcohol use: No  . Drug use: No  . Sexual activity: Yes    Birth control/protection: None  Other Topics Concern  . 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 Difficulty of Paying Living Expenses: Not on file  Food Insecurity:   . Worried About Charity fundraiser in the Last Year: Not on file  . Ran Out of Food in the Last Year: Not on file  Transportation Needs:   . Lack of Transportation (Medical): Not on file  . Lack of Transportation (Non-Medical): Not on file  Physical Activity:   . Days of Exercise per Week: Not on file  . Minutes of Exercise per Session: Not on file  Stress:   . Feeling of Stress : Not on file  Social Connections:   . Frequency of Communication with Friends and Family: Not on file  . Frequency of Social Gatherings with  Friends and Family: Not on file  . Attends Religious Services: Not on file  . Active Member of Clubs or Organizations: Not on file  . Attends Archivist Meetings: Not on file  . Marital Status: Not on file     Family History: The patient's family history includes Asthma in his brother; COPD in his father; Liver cancer in his mother; Lung cancer in his paternal grandfather; Prostate cancer in his paternal grandfather. There is no history of Colon cancer, Esophageal cancer, Rectal cancer, or Stomach cancer. ROS:   Please see the history of present illness.    All 14 point review of systems negative except as described per history of present illness  EKGs/Labs/Other Studies Reviewed:      Recent Labs: No results found for requested labs within last 8760 hours.  Recent Lipid Panel No results found for: CHOL, TRIG, HDL, CHOLHDL, VLDL, LDLCALC, LDLDIRECT  Physical Exam:    VS:  BP 108/68 (BP Location: Right Arm, Patient Position: Sitting, Cuff Size: Normal)   Pulse 70   Ht 5' 6.5" (1.689 m)   Wt 146 lb (66.2 kg)   SpO2 97%   BMI 23.21 kg/m     Wt Readings from Last 3 Encounters:  03/06/20 146 lb (66.2 kg)  02/20/19 149 lb 1.9 oz (67.6 kg)  06/24/16 154 lb 12.8 oz (70.2 kg)     GEN:  Well nourished, well developed in no acute distress HEENT: Normal NECK: No JVD; No carotid bruits LYMPHATICS: No lymphadenopathy CARDIAC: RRR, holosystolic murmur grade 1/6 to 2/6 best heard at left border of the sternum without radiation, no rubs, no gallops RESPIRATORY:  Clear to auscultation without rales, wheezing or rhonchi  ABDOMEN: Soft, non-tender, non-distended MUSCULOSKELETAL:  No edema; No deformity  SKIN: Warm and dry LOWER EXTREMITIES: no swelling NEUROLOGIC:  Alert and oriented x 3 PSYCHIATRIC:  Normal affect   ASSESSMENT:    1. Moderate mitral regurgitation   2. Essential hypertension   3. Dyslipidemia    PLAN:    In order of problems listed above:  1. Mitral  regurgitation which appears to be mild on last echocardiogram.  On the physical exam he does have some murmur, I will ask him to have an echocardiogram done to recheck left ventricle ejection fraction as well as degree of mitral regurgitation. 2. Essential hypertension: Well-controlled continue present management. 3. Dyslipidemia: We will schedule him to have fasting  lipid profile.   Medication Adjustments/Labs and Tests Ordered: Current medicines are reviewed at length with the patient today.  Concerns regarding medicines are outlined above.  Orders Placed This Encounter  Procedures  . EKG 12-Lead  . ECHOCARDIOGRAM COMPLETE   Medication changes: No orders of the defined types were placed in this encounter.   Signed, Park Liter, MD, [facility] 03/06/2020 4:44 PM    [place]

## 2020-03-13 ENCOUNTER — Telehealth: Payer: Self-pay | Admitting: Cardiology

## 2020-03-13 NOTE — Telephone Encounter (Signed)
Yes, s he need to have an echo this month

## 2020-03-13 NOTE — Telephone Encounter (Signed)
James Atkinson is calling wanting to make sure he needs to come in for his Echo this month. He states he was under the impression he did not need to come in until yesterday and just wanted to clarify before he comes in. He also wanted to let Dr. Agustin Cree know he is the best Doctor he's ever had, and loves how comical he is making situations lighter to bear. Please advise.

## 2020-03-13 NOTE — Telephone Encounter (Signed)
Yes, he needs echo this month

## 2020-03-13 NOTE — Telephone Encounter (Signed)
Left message for patient to return call.

## 2020-03-15 NOTE — Telephone Encounter (Signed)
Patient informed he does need echo this month.

## 2020-03-29 ENCOUNTER — Ambulatory Visit (HOSPITAL_BASED_OUTPATIENT_CLINIC_OR_DEPARTMENT_OTHER)
Admission: RE | Admit: 2020-03-29 | Discharge: 2020-03-29 | Disposition: A | Discharge status: Home / Self Care | Payer: BC Managed Care – PPO | Source: Ambulatory Visit | Attending: Cardiology | Admitting: Cardiology

## 2020-03-29 ENCOUNTER — Other Ambulatory Visit: Payer: Self-pay

## 2020-03-29 DIAGNOSIS — I340 Nonrheumatic mitral (valve) insufficiency: Secondary | ICD-10-CM | POA: Insufficient documentation

## 2020-03-29 LAB — ECHOCARDIOGRAM COMPLETE
Area-P 1/2: 2.73 cm2
S' Lateral: 2.56 cm

## 2020-04-08 DIAGNOSIS — H2513 Age-related nuclear cataract, bilateral: Secondary | ICD-10-CM | POA: Diagnosis not present

## 2020-04-08 DIAGNOSIS — H5203 Hypermetropia, bilateral: Secondary | ICD-10-CM | POA: Diagnosis not present

## 2020-04-08 DIAGNOSIS — H40023 Open angle with borderline findings, high risk, bilateral: Secondary | ICD-10-CM | POA: Diagnosis not present

## 2020-05-08 DIAGNOSIS — Z0000 Encounter for general adult medical examination without abnormal findings: Secondary | ICD-10-CM | POA: Diagnosis not present

## 2020-05-08 DIAGNOSIS — L820 Inflamed seborrheic keratosis: Secondary | ICD-10-CM | POA: Diagnosis not present

## 2020-05-08 DIAGNOSIS — Z23 Encounter for immunization: Secondary | ICD-10-CM | POA: Diagnosis not present

## 2020-05-08 DIAGNOSIS — R7303 Prediabetes: Secondary | ICD-10-CM | POA: Diagnosis not present

## 2020-05-08 DIAGNOSIS — L821 Other seborrheic keratosis: Secondary | ICD-10-CM | POA: Diagnosis not present

## 2020-05-08 DIAGNOSIS — E7800 Pure hypercholesterolemia, unspecified: Secondary | ICD-10-CM | POA: Diagnosis not present

## 2020-05-08 DIAGNOSIS — I10 Essential (primary) hypertension: Secondary | ICD-10-CM | POA: Diagnosis not present

## 2020-07-09 DIAGNOSIS — Z03818 Encounter for observation for suspected exposure to other biological agents ruled out: Secondary | ICD-10-CM | POA: Diagnosis not present

## 2020-12-17 DIAGNOSIS — R7309 Other abnormal glucose: Secondary | ICD-10-CM | POA: Diagnosis not present

## 2020-12-17 DIAGNOSIS — K219 Gastro-esophageal reflux disease without esophagitis: Secondary | ICD-10-CM | POA: Diagnosis not present

## 2020-12-17 DIAGNOSIS — Z125 Encounter for screening for malignant neoplasm of prostate: Secondary | ICD-10-CM | POA: Diagnosis not present

## 2020-12-17 DIAGNOSIS — D696 Thrombocytopenia, unspecified: Secondary | ICD-10-CM | POA: Diagnosis not present

## 2020-12-17 DIAGNOSIS — R7303 Prediabetes: Secondary | ICD-10-CM | POA: Diagnosis not present

## 2020-12-17 DIAGNOSIS — I10 Essential (primary) hypertension: Secondary | ICD-10-CM | POA: Diagnosis not present

## 2020-12-17 DIAGNOSIS — E7800 Pure hypercholesterolemia, unspecified: Secondary | ICD-10-CM | POA: Diagnosis not present

## 2021-01-28 DIAGNOSIS — J029 Acute pharyngitis, unspecified: Secondary | ICD-10-CM | POA: Diagnosis not present

## 2021-01-29 DIAGNOSIS — J029 Acute pharyngitis, unspecified: Secondary | ICD-10-CM | POA: Diagnosis not present

## 2021-01-29 DIAGNOSIS — Z20822 Contact with and (suspected) exposure to covid-19: Secondary | ICD-10-CM | POA: Diagnosis not present

## 2021-03-31 DIAGNOSIS — K649 Unspecified hemorrhoids: Secondary | ICD-10-CM | POA: Insufficient documentation

## 2021-03-31 DIAGNOSIS — K635 Polyp of colon: Secondary | ICD-10-CM | POA: Insufficient documentation

## 2021-03-31 DIAGNOSIS — K219 Gastro-esophageal reflux disease without esophagitis: Secondary | ICD-10-CM | POA: Insufficient documentation

## 2021-03-31 DIAGNOSIS — Z8619 Personal history of other infectious and parasitic diseases: Secondary | ICD-10-CM | POA: Insufficient documentation

## 2021-03-31 DIAGNOSIS — T8859XA Other complications of anesthesia, initial encounter: Secondary | ICD-10-CM | POA: Insufficient documentation

## 2021-03-31 DIAGNOSIS — H9319 Tinnitus, unspecified ear: Secondary | ICD-10-CM | POA: Insufficient documentation

## 2021-03-31 DIAGNOSIS — M1990 Unspecified osteoarthritis, unspecified site: Secondary | ICD-10-CM | POA: Insufficient documentation

## 2021-03-31 DIAGNOSIS — E7800 Pure hypercholesterolemia, unspecified: Secondary | ICD-10-CM | POA: Insufficient documentation

## 2021-03-31 DIAGNOSIS — T7840XA Allergy, unspecified, initial encounter: Secondary | ICD-10-CM | POA: Insufficient documentation

## 2021-04-07 ENCOUNTER — Encounter: Payer: Self-pay | Admitting: Cardiology

## 2021-04-07 ENCOUNTER — Ambulatory Visit (INDEPENDENT_AMBULATORY_CARE_PROVIDER_SITE_OTHER): Payer: BC Managed Care – PPO | Admitting: Cardiology

## 2021-04-07 ENCOUNTER — Other Ambulatory Visit: Payer: Self-pay

## 2021-04-07 VITALS — BP 144/76 | HR 52 | Ht 66.5 in | Wt 148.0 lb

## 2021-04-07 DIAGNOSIS — I340 Nonrheumatic mitral (valve) insufficiency: Secondary | ICD-10-CM | POA: Diagnosis not present

## 2021-04-07 DIAGNOSIS — E785 Hyperlipidemia, unspecified: Secondary | ICD-10-CM | POA: Diagnosis not present

## 2021-04-07 DIAGNOSIS — I10 Essential (primary) hypertension: Secondary | ICD-10-CM | POA: Diagnosis not present

## 2021-04-07 NOTE — Progress Notes (Signed)
Cardiology Office Note:    Date:  04/07/2021   ID:  James Atkinson, DOB [birth year], MRN [id number]  PCP:  James Frees, MD  Cardiologist:  James Campus, MD    Referring MD: James Frees, MD   No chief complaint on file. I am doing fine  History of Present Illness:    James Atkinson is a 75 y.o. male with past medical history significant for essential hypertension, moderate mitral regurgitation, dyslipidemia.  He comes today to my office for follow-up.  Overall he is doing fine.  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  Denies have any chest pain tightness squeezing pressure burning chest no swelling of lower extremities no palpitations  Past Medical History:  Diagnosis Date   Allergy    Arthritis    thumbs, knees, back   Colon polyp    Complication of anesthesia    "hard to wake up after colonoscopy"   GERD (gastroesophageal reflux disease)    H/O measles    as child   H/O mumps    as child   Hemorrhoids    hx of   History of shingles    Hypercholesteremia    diet controlled   Hypertension    Pneumonia 2012   hx of   Ringing in ears     Past Surgical History:  Procedure Laterality Date   bilataterl total knee replacement     COLONOSCOPY     x2   ESOPHAGEAL DILATION     HEMORRHOID BANDING     x8    HERNIA REPAIR Bilateral 2012   KNEE ARTHROSCOPY Left 23 years ago   NASAL SEPTUM SURGERY  25 years ago   ROTATOR CUFF REPAIR Left 7 years ago   TONSILLECTOMY  2nd grade   TOTAL KNEE ARTHROPLASTY Bilateral 02/14/2014   Procedure: BILATERAL TOTAL KNEE ARTHROPLASTY;  Surgeon: James Alf, MD;  Location: [facility];  Service: Orthopedics;  Laterality: Bilateral;    Current Medications: Current Meds  Medication Sig   Ascorbic Acid (VITAMIN C) 1000 MG tablet Take 1,000 mg by mouth daily.   aspirin 81 MG tablet Take 81 mg by mouth daily.   atorvastatin (LIPITOR) 40 MG tablet Take 40 mg by mouth daily.     Cholecalciferol (VITAMIN D) 125 MCG (5000 UT) CAPS Take 5,000 Units by mouth daily.    ferrous sulfate 324 MG TBEC Take 324 mg by mouth.   lisinopril (PRINIVIL,ZESTRIL) 20 MG tablet Take 20 mg by mouth every morning.    Multiple Vitamin (MULTIVITAMIN) capsule Take 1 capsule by mouth daily.   pantoprazole (PROTONIX) 40 MG tablet Take 40 mg by mouth once a week.    Turmeric 500 MG CAPS Take 1 capsule by mouth daily.     Allergies:   Ciprofloxacin   Social History   Socioeconomic History   Marital status: Married    Spouse name: Not on file   Number of children: 2   Years of education: Not on file   Highest education level: Not on file  Occupational History   Not on file  Tobacco Use   Smoking status: Former    Packs/day: 1.00    Years: 10.00    Pack years: 10.00    Types: Cigarettes    Quit date: 07/06/1988    Years since quitting: 32.7   Smokeless tobacco: Former    Types: Chew    Quit date: 07/06/1988  Substance and Sexual Activity  Alcohol use: No   Drug use: No   Sexual activity: Yes    Birth control/protection: None  Other Topics Concern   Not on file  Social History Narrative   Married, 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 There is no history of Colon cancer, Esophageal cancer, Rectal cancer, or Stomach cancer. ROS:   Please see the history of present illness.    All 14 point review of systems negative except as described per history of present illness  EKGs/Labs/Other Studies Reviewed:       Recent Labs: No results found for requested labs within last 8760 hours.  Recent Lipid Panel No results found for: CHOL, TRIG, HDL, CHOLHDL, VLDL, LDLCALC, LDLDIRECT  Physical Exam:    VS:  BP (!) 144/76 (BP Location: Right Arm, Patient Position: Sitting, Cuff Size: Normal)   Pulse (!) 52   Ht 5' 6.5" (1.689 m)   Wt 148 lb (67.1 kg)   SpO2 98%   BMI 23.53 kg/m     Wt Readings from Last 3 Encounters:  04/07/21 148 lb (67.1 kg)  03/06/20 146 lb (66.2 kg)  02/20/19 149 lb 1.9 oz (67.6 kg)     GEN:  Well nourished, well developed in no acute distress HEENT: Normal NECK: No JVD; No carotid bruits LYMPHATICS: No lymphadenopathy CARDIAC: RRR, holosystolic murmur grade 2/6 best heard at the apex, no rubs, no gallops RESPIRATORY:  Clear to auscultation without rales, wheezing or rhonchi  ABDOMEN: Soft, non-tender, non-distended MUSCULOSKELETAL:  No edema; No deformity  SKIN: Warm and dry LOWER EXTREMITIES: no swelling NEUROLOGIC:  Alert and oriented x 3 PSYCHIATRIC:  Normal affect   ASSESSMENT:    1. Moderate mitral regurgitation   2. Primary hypertension   3. Dyslipidemia    PLAN:    In order of problems listed above:  Mitral regurgitation which was last echocardiogram showing mild we will repeat another echocardiogram overall he seems to be doing clinically well Essential hypertension blood pressure elevated today but he tells me he check it at home it is always 726O to 035D systolic we will continue present management. Dyslipidemia he is taking Lipitor last fasting lipid profile I reviewed from K PN which show me LDL of 53 HDL of 58 this is from 12/17/2020 to good cholesterol profile continue present management. Sinus bradycardia on EKG today but he is completely asymptomatic I told him to let me know if he became dizzy or passed out   Medication Adjustments/Labs and Tests Ordered: Current medicines are reviewed at length with the patient today.  Concerns regarding  medicines are outlined above.  No orders of the defined types were placed in this encounter.  Medication changes: No orders of the defined types were placed in this encounter.   Signed, James Liter, MD, [facility] 04/07/2021 11:26 AM    James Atkinson

## 2021-04-07 NOTE — Patient Instructions (Signed)
Medication Instructions:  Your physician recommends that you continue on your current medications as directed. Please refer to the Current Medication list given to you today.  *If you need a refill on your cardiac medications before your next appointment, please call your pharmacy*   Lab Work: None If you have labs (blood work) drawn today and your tests are completely normal, you will receive your results only by: [place] (if you have MyChart) OR A paper copy in the mail If you have any lab test that is abnormal or we need to change your treatment, we will call you to review the results.   Testing/Procedures: Your physician has requested that you have an echocardiogram. Echocardiography is a painless test that uses sound waves to create images of your heart. It provides your doctor with information about the size and shape of your heart and how well your heart's chambers and valves are working. This procedure takes approximately one hour. There are no restrictions for this procedure.    Follow-Up: At [facility], you and your health needs are our priority.  As part of our continuing mission to provide you with exceptional heart care, we have created designated Provider Care Teams.  These Care Teams include your primary Cardiologist (physician) and Advanced Practice Providers (APPs -  Physician Assistants and Nurse Practitioners) who all work together to provide you with the care you need, when you need it.  We recommend signing up for the patient portal called "MyChart".  Sign up information is provided on this After Visit Summary.  MyChart is used to connect with patients for Virtual Visits (Telemedicine).  Patients are able to view lab/test results, encounter notes, upcoming appointments, etc.  Non-urgent messages can be sent to your provider as well.   To learn more about what you can do with MyChart, go to [email or web address].    Your next appointment:   12  month(s)  The format for your next appointment:   In Person  Provider:   Jenne Campus, MD   Other Instructions  Echocardiogram An echocardiogram is a test that uses sound waves (ultrasound) to produce images of the heart. Images from an echocardiogram can provide important information about: Heart size and shape. The size and thickness and movement of your heart's walls. Heart muscle function and strength. Heart valve function or if you have stenosis. Stenosis is when the heart valves are too narrow. If blood is flowing backward through the heart valves (regurgitation). A tumor or infectious growth around the heart valves. Areas of heart muscle that are not working well because of poor blood flow or injury from a heart attack. Aneurysm detection. An aneurysm is a weak or damaged part of an artery wall. The wall bulges out from the normal force of blood pumping through the body. Tell a health care provider about: Any allergies you have. All medicines you are taking, including vitamins, herbs, eye drops, creams, and over-the-counter medicines. Any blood disorders you have. Any surgeries you have had. Any medical conditions you have. Whether you are pregnant or may be pregnant. What are the risks? Generally, this is a safe test. However, problems may occur, including an allergic reaction to dye (contrast) that may be used during the test. What happens before the test? No specific preparation is needed. You may eat and drink normally. What happens during the test?  You will take off your clothes from the waist up and put on a hospital gown. Electrodes or electrocardiogram (ECG)patches may be placed  on your chest. The electrodes or patches are then connected to a device that monitors your heart rate and rhythm. You will lie down on a table for an ultrasound exam. A gel will be applied to your chest to help sound waves pass through your skin. A handheld device, called a  transducer, will be pressed against your chest and moved over your heart. The transducer produces sound waves that travel to your heart and bounce back (or "echo" back) to the transducer. These sound waves will be captured in real-time and changed into images of your heart that can be viewed on a video monitor. The images will be recorded on a computer and reviewed by your health care provider. You may be asked to change positions or hold your breath for a short time. This makes it easier to get different views or better views of your heart. In some cases, you may receive contrast through an IV in one of your veins. This can improve the quality of the pictures from your heart. The procedure may vary among health care providers and hospitals. What can I expect after the test? You may return to your normal, everyday life, including diet, activities, and medicines, unless your health care provider tells you not to do that. Follow these instructions at home: It is up to you to get the results of your test. Ask your health care provider, or the department that is doing the test, when your results will be ready. Keep all follow-up visits. This is important. Summary An echocardiogram is a test that uses sound waves (ultrasound) to produce images of the heart. Images from an echocardiogram can provide important information about the size and shape of your heart, heart muscle function, heart valve function, and other possible heart problems. You do not need to do anything to prepare before this test. You may eat and drink normally. After the echocardiogram is completed, you may return to your normal, everyday life, unless your health care provider tells you not to do that. This information is not intended to replace advice given to you by your health care provider. Make sure you discuss any questions you have with your health care provider. Document Revised: 02/13/2020 Document Reviewed: 02/13/2020 Elsevier  Patient Education  2022 [employer].

## 2021-04-11 DIAGNOSIS — H5203 Hypermetropia, bilateral: Secondary | ICD-10-CM | POA: Diagnosis not present

## 2021-04-11 DIAGNOSIS — H40023 Open angle with borderline findings, high risk, bilateral: Secondary | ICD-10-CM | POA: Diagnosis not present

## 2021-04-11 DIAGNOSIS — L570 Actinic keratosis: Secondary | ICD-10-CM | POA: Diagnosis not present

## 2021-04-11 DIAGNOSIS — E119 Type 2 diabetes mellitus without complications: Secondary | ICD-10-CM | POA: Diagnosis not present

## 2021-04-11 DIAGNOSIS — Z23 Encounter for immunization: Secondary | ICD-10-CM | POA: Diagnosis not present

## 2021-04-11 DIAGNOSIS — H2513 Age-related nuclear cataract, bilateral: Secondary | ICD-10-CM | POA: Diagnosis not present

## 2021-04-18 ENCOUNTER — Other Ambulatory Visit: Payer: Self-pay

## 2021-04-18 ENCOUNTER — Ambulatory Visit (HOSPITAL_BASED_OUTPATIENT_CLINIC_OR_DEPARTMENT_OTHER)
Admission: RE | Admit: 2021-04-18 | Discharge: 2021-04-18 | Disposition: A | Discharge status: Home / Self Care | Payer: BC Managed Care – PPO | Source: Ambulatory Visit | Attending: Cardiology | Admitting: Cardiology

## 2021-04-18 DIAGNOSIS — I340 Nonrheumatic mitral (valve) insufficiency: Secondary | ICD-10-CM

## 2021-04-18 DIAGNOSIS — I10 Essential (primary) hypertension: Secondary | ICD-10-CM

## 2021-04-18 DIAGNOSIS — E785 Hyperlipidemia, unspecified: Secondary | ICD-10-CM

## 2021-04-18 LAB — ECHOCARDIOGRAM COMPLETE
AR max vel: 2.12 cm2
AV Area VTI: 2.28 cm2
AV Area mean vel: 2.17 cm2
AV Mean grad: 6 mmHg
AV Peak grad: 12.1 mmHg
Ao pk vel: 1.74 m/s
Area-P 1/2: 3.72 cm2
Calc EF: 59.4 %
MV M vel: 5.31 m/s
MV Peak grad: 112.9 mmHg
MV VTI: 2.71 cm2
Radius: 0.3 cm
S' Lateral: 2.6 cm
Single Plane A2C EF: 60.2 %
Single Plane A4C EF: 60.1 %

## 2021-04-22 ENCOUNTER — Telehealth: Payer: Self-pay | Admitting: Cardiology

## 2021-04-22 NOTE — Telephone Encounter (Signed)
Pt is calling back regarding his recent test results. Please advise pt further Pt had an Echo on 04/18/21

## 2021-04-22 NOTE — Telephone Encounter (Signed)
Patient informed of results.  

## 2021-05-15 DIAGNOSIS — M545 Low back pain, unspecified: Secondary | ICD-10-CM | POA: Diagnosis not present

## 2021-05-15 DIAGNOSIS — Z0000 Encounter for general adult medical examination without abnormal findings: Secondary | ICD-10-CM | POA: Diagnosis not present

## 2021-05-15 DIAGNOSIS — M109 Gout, unspecified: Secondary | ICD-10-CM | POA: Diagnosis not present

## 2021-05-15 DIAGNOSIS — R7303 Prediabetes: Secondary | ICD-10-CM | POA: Diagnosis not present

## 2021-05-15 DIAGNOSIS — E7800 Pure hypercholesterolemia, unspecified: Secondary | ICD-10-CM | POA: Diagnosis not present

## 2021-05-15 DIAGNOSIS — I10 Essential (primary) hypertension: Secondary | ICD-10-CM | POA: Diagnosis not present

## 2021-05-15 DIAGNOSIS — D696 Thrombocytopenia, unspecified: Secondary | ICD-10-CM | POA: Diagnosis not present

## 2021-05-21 ENCOUNTER — Other Ambulatory Visit: Payer: Self-pay | Admitting: Family Medicine

## 2021-05-21 ENCOUNTER — Ambulatory Visit
Admission: RE | Admit: 2021-05-21 | Discharge: 2021-05-21 | Disposition: A | Discharge status: Home / Self Care | Payer: BC Managed Care – PPO | Source: Ambulatory Visit | Attending: Family Medicine | Admitting: Family Medicine

## 2021-05-21 DIAGNOSIS — M545 Low back pain, unspecified: Secondary | ICD-10-CM

## 2021-06-10 DIAGNOSIS — M25522 Pain in left elbow: Secondary | ICD-10-CM | POA: Diagnosis not present

## 2021-06-10 DIAGNOSIS — S5002XA Contusion of left elbow, initial encounter: Secondary | ICD-10-CM | POA: Diagnosis not present

## 2021-06-16 DIAGNOSIS — H903 Sensorineural hearing loss, bilateral: Secondary | ICD-10-CM | POA: Diagnosis not present

## 2021-07-21 DIAGNOSIS — H43812 Vitreous degeneration, left eye: Secondary | ICD-10-CM | POA: Diagnosis not present

## 2021-08-18 DIAGNOSIS — H43812 Vitreous degeneration, left eye: Secondary | ICD-10-CM | POA: Diagnosis not present

## 2021-09-26 DIAGNOSIS — H903 Sensorineural hearing loss, bilateral: Secondary | ICD-10-CM | POA: Diagnosis not present

## 2021-11-18 DIAGNOSIS — M1A00X0 Idiopathic chronic gout, unspecified site, without tophus (tophi): Secondary | ICD-10-CM | POA: Diagnosis not present

## 2021-11-18 DIAGNOSIS — K4090 Unilateral inguinal hernia, without obstruction or gangrene, not specified as recurrent: Secondary | ICD-10-CM | POA: Diagnosis not present

## 2021-11-18 DIAGNOSIS — R7303 Prediabetes: Secondary | ICD-10-CM | POA: Diagnosis not present

## 2021-11-18 DIAGNOSIS — K219 Gastro-esophageal reflux disease without esophagitis: Secondary | ICD-10-CM | POA: Diagnosis not present

## 2021-11-18 DIAGNOSIS — I10 Essential (primary) hypertension: Secondary | ICD-10-CM | POA: Diagnosis not present

## 2021-11-18 DIAGNOSIS — E7800 Pure hypercholesterolemia, unspecified: Secondary | ICD-10-CM | POA: Diagnosis not present

## 2022-03-27 ENCOUNTER — Ambulatory Visit: Payer: Self-pay | Admitting: General Surgery

## 2022-03-27 DIAGNOSIS — K4091 Unilateral inguinal hernia, without obstruction or gangrene, recurrent: Secondary | ICD-10-CM | POA: Insufficient documentation

## 2022-04-09 ENCOUNTER — Ambulatory Visit: Payer: BC Managed Care – PPO | Attending: Cardiology | Admitting: Cardiology

## 2022-04-09 ENCOUNTER — Ambulatory Visit: Payer: BC Managed Care – PPO | Attending: Cardiology

## 2022-04-09 ENCOUNTER — Encounter: Payer: Self-pay | Admitting: Cardiology

## 2022-04-09 VITALS — BP 108/72 | HR 49 | Ht 66.5 in | Wt 143.0 lb

## 2022-04-09 DIAGNOSIS — I340 Nonrheumatic mitral (valve) insufficiency: Secondary | ICD-10-CM

## 2022-04-09 DIAGNOSIS — R001 Bradycardia, unspecified: Secondary | ICD-10-CM

## 2022-04-09 DIAGNOSIS — E785 Hyperlipidemia, unspecified: Secondary | ICD-10-CM | POA: Diagnosis not present

## 2022-04-09 DIAGNOSIS — I10 Essential (primary) hypertension: Secondary | ICD-10-CM | POA: Diagnosis not present

## 2022-04-09 NOTE — Progress Notes (Signed)
Cardiology Office Note:    Date:  04/09/2022   ID:  James Atkinson, DOB [birth year], MRN [id number]  PCP:  Shirline Frees, MD  Cardiologist:  Jenne Campus, MD    Referring MD: Shirline Frees, MD   Chief Complaint  Patient presents with   Follow-up  Doing fine  History of Present Illness:    James Atkinson is a 76 y.o. male with past medical history significant for essential hypertension, mild to moderate mitral regurgitation, dyslipidemia.  Comes today to my office for follow-up.  Overall doing very well.  Still very active can walk climb stairs with no difficulty does get some shortness of breath.  He was noted to have sinus bradycardia today on EKG with heart rate 49.  He denies have any dizziness passing out.  Past Medical History:  Diagnosis Date   Allergy    Arthritis    thumbs, knees, back   Colon polyp    Complication of anesthesia    "hard to wake up after colonoscopy"   GERD (gastroesophageal reflux disease)    H/O measles    as child   H/O mumps    as child   Hemorrhoids    hx of   History of shingles    Hypercholesteremia    diet controlled   Hypertension    Pneumonia 2012   hx of   Ringing in ears     Past Surgical History:  Procedure Laterality Date   bilataterl total knee replacement     COLONOSCOPY     x2   ESOPHAGEAL DILATION     HEMORRHOID BANDING     x8    HERNIA REPAIR Bilateral 2012   KNEE ARTHROSCOPY Left 23 years ago   NASAL SEPTUM SURGERY  25 years ago   ROTATOR CUFF REPAIR Left 7 years ago   TONSILLECTOMY  2nd grade   TOTAL KNEE ARTHROPLASTY Bilateral 02/14/2014   Procedure: BILATERAL TOTAL KNEE ARTHROPLASTY;  Surgeon: Gearlean Alf, MD;  Location: [facility];  Service: Orthopedics;  Laterality: Bilateral;    Current Medications: Current Meds  Medication Sig   Ascorbic Acid (VITAMIN C) 1000 MG tablet Take 1,000 mg by mouth daily.   aspirin 81 MG tablet Take 81 mg by mouth daily.   atorvastatin (LIPITOR) 40 MG tablet Take 40  mg by mouth daily.    Cholecalciferol (VITAMIN D) 125 MCG (5000 UT) CAPS Take 5,000 Units by mouth daily.    ferrous sulfate 324 MG TBEC Take 324 mg by mouth daily with breakfast.   lisinopril (PRINIVIL,ZESTRIL) 20 MG tablet Take 20 mg by mouth every morning.    Multiple Vitamin (MULTIVITAMIN) capsule Take 1 capsule by mouth daily.   OVER THE COUNTER MEDICATION Take 1 tablet by mouth every other day. CBD   pantoprazole (PROTONIX) 40 MG tablet Take 40 mg by mouth once a week.    Turmeric 500 MG CAPS Take 1 capsule by mouth daily.     Allergies:   Ciprofloxacin   Social History   Socioeconomic History   Marital status: Married    Spouse name: Not on file   Number of children: 2   Years of education: Not on file   Highest education level: Not on file  Occupational History   Not on file  Tobacco Use   Smoking status: Former    Packs/day: 1.00    Years: 10.00    Total pack years: 10.00    Types: Cigarettes    Quit date: 07/06/1988  Years since quitting: 33.7   Smokeless tobacco: Former    Types: Chew    Quit date: 07/06/1988  Substance and Sexual Activity   Alcohol use: No   Drug use: No   Sexual activity: Yes    Birth control/protection: None  Other Topics Concern   Not on file  Social History Narrative   Married, 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 There is no history of Colon cancer, Esophageal cancer, Rectal cancer, or Stomach cancer. ROS:   Please see the history of present illness.    All 14  point review of systems negative except as described per history of present illness  EKGs/Labs/Other Studies Reviewed:      Recent Labs: No results found for requested labs within last 365 days.  Recent Lipid Panel No results found for: "CHOL", "TRIG", "HDL", "CHOLHDL", "VLDL", "LDLCALC", "LDLDIRECT"  Physical Exam:    VS:  BP 108/72 (BP Location: Left Arm, Patient Position: Sitting)   Pulse (!) 49   Ht 5' 6.5" (1.689 m)   Wt 143 lb (64.9 kg)   SpO2 97%   BMI 22.74 kg/m     Wt Readings from Last 3 Encounters:  04/09/22 143 lb (64.9 kg)  04/07/21 148 lb (67.1 kg)  03/06/20 146 lb (66.2 kg)     GEN:  Well nourished, well developed in no acute distress HEENT: Normal NECK: No JVD; No carotid bruits LYMPHATICS: No lymphadenopathy CARDIAC: RRR, holosystolic murmur grade 2/6 best heard at the apex, no rubs, no gallops RESPIRATORY:  Clear to auscultation without rales, wheezing or rhonchi  ABDOMEN: Soft, non-tender, non-distended MUSCULOSKELETAL:  No edema; No deformity  SKIN: Warm and dry LOWER EXTREMITIES: no swelling NEUROLOGIC:  Alert and oriented x 3 PSYCHIATRIC:  Normal affect   ASSESSMENT:    1. Moderate mitral regurgitation   2. Primary hypertension   3. Dyslipidemia   4. Bradycardia    PLAN:    In order of problems listed above:  Moderate mitral valve regurgitation the heart murmur was culpable on the physical exam.  We will schedule him to have an echocardiogram to reassess the valve.  Likely clinically he is doing quite well. Essential hypertension blood pressure well controlled continue present management. Sinus bradycardia.  Ask him to wear Zio patch for 1 week to see if he gets any significant arrhythmia and bradycardia. Dyslipidemia I did review his K PN which showed LDL of 53 HDL 46 excellent cholesterol control continue present management.   Medication Adjustments/Labs and Tests Ordered: Current medicines are reviewed at length with the patient  today.  Concerns regarding medicines are outlined above.  No orders of the defined types were placed in this encounter.  Medication changes: No orders of the defined types were placed in this encounter.   Signed, Park Liter, MD, [facility] 04/09/2022 2:35 PM    [facility]

## 2022-04-09 NOTE — Patient Instructions (Addendum)
Medication Instructions:  Your physician recommends that you continue on your current medications as directed. Please refer to the Current Medication list given to you today.  *If you need a refill on your cardiac medications before your next appointment, please call your pharmacy*   Lab Work: None Ordered If you have labs (blood work) drawn today and your tests are completely normal, you will receive your results only by: [place] (if you have MyChart) OR A paper copy in the mail If you have any lab test that is abnormal or we need to change your treatment, we will call you to review the results.   Testing/Procedures: Your physician has requested that you have an echocardiogram. Echocardiography is a painless test that uses sound waves to create images of your heart. It provides your doctor with information about the size and shape of your heart and how well your heart's chambers and valves are working. This procedure takes approximately one hour. There are no restrictions for this procedure.    WHY IS MY DOCTOR PRESCRIBING ZIO? The Zio system is proven and trusted by physicians to detect and diagnose irregular heart rhythms -- and has been prescribed to hundreds of thousands of patients.  The FDA has cleared the Zio system to monitor for many different kinds of irregular heart rhythms. In a study, physicians were able to reach a diagnosis 90% of the time with the Zio system1.  You can wear the Zio monitor -- a small, discreet, comfortable patch -- during your normal day-to-day activity, including while you sleep, shower, and exercise, while it records every single heartbeat for analysis.  1Barrett, P., et al. Comparison of 24 Hour Holter Monitoring Versus 14 Day Novel Adhesive Patch Electrocardiographic Monitoring. [place], 2014.  ZIO VS. HOLTER MONITORING The Zio monitor can be comfortably worn for up to 14 days. Holter monitors can be worn for 24 to 48  hours, limiting the time to record any irregular heart rhythms you may have. Zio is able to capture data for the 51% of patients who have their first symptom-triggered arrhythmia after 48 hours.1  LIVE WITHOUT RESTRICTIONS The Zio ambulatory cardiac monitor is a small, unobtrusive, and water-resistant patch--you might even forget you're wearing it. The Zio monitor records and stores every beat of your heart, whether you're sleeping, working out, or showering.     Follow-Up: At [facility], you and your health needs are our priority.  As part of our continuing mission to provide you with exceptional heart care, we have created designated Provider Care Teams.  These Care Teams include your primary Cardiologist (physician) and Advanced Practice Providers (APPs -  Physician Assistants and Nurse Practitioners) who all work together to provide you with the care you need, when you need it.  We recommend signing up for the patient portal called "MyChart".  Sign up information is provided on this After Visit Summary.  MyChart is used to connect with patients for Virtual Visits (Telemedicine).  Patients are able to view lab/test results, encounter notes, upcoming appointments, etc.  Non-urgent messages can be sent to your provider as well.   To learn more about what you can do with MyChart, go to [email or web address].    Your next appointment:   12 month(s)  The format for your next appointment:   In Person  Provider:   Jenne Campus, MD    Other Instructions NA

## 2022-04-17 DIAGNOSIS — H2513 Age-related nuclear cataract, bilateral: Secondary | ICD-10-CM | POA: Diagnosis not present

## 2022-04-17 DIAGNOSIS — H5203 Hypermetropia, bilateral: Secondary | ICD-10-CM | POA: Diagnosis not present

## 2022-04-17 DIAGNOSIS — H40023 Open angle with borderline findings, high risk, bilateral: Secondary | ICD-10-CM | POA: Diagnosis not present

## 2022-04-20 ENCOUNTER — Ambulatory Visit (HOSPITAL_BASED_OUTPATIENT_CLINIC_OR_DEPARTMENT_OTHER)
Admission: RE | Admit: 2022-04-20 | Discharge: 2022-04-20 | Disposition: A | Discharge status: Home / Self Care | Payer: BC Managed Care – PPO | Source: Ambulatory Visit | Attending: Cardiology | Admitting: Cardiology

## 2022-04-20 DIAGNOSIS — I340 Nonrheumatic mitral (valve) insufficiency: Secondary | ICD-10-CM | POA: Insufficient documentation

## 2022-04-20 NOTE — Progress Notes (Signed)
  Echocardiogram 2D Echocardiogram has been performed.  Merrie Roof F 04/20/2022, 9:52 AM

## 2022-04-21 LAB — ECHOCARDIOGRAM COMPLETE
AR max vel: 1.85 cm2
AV Area VTI: 1.61 cm2
AV Area mean vel: 1.64 cm2
AV Mean grad: 7 mmHg
AV Peak grad: 12.3 mmHg
Ao pk vel: 1.75 m/s
Area-P 1/2: 3.81 cm2
S' Lateral: 3 cm

## 2022-04-24 ENCOUNTER — Encounter (HOSPITAL_BASED_OUTPATIENT_CLINIC_OR_DEPARTMENT_OTHER): Payer: Self-pay | Admitting: General Surgery

## 2022-04-27 ENCOUNTER — Telehealth: Payer: Self-pay

## 2022-04-27 NOTE — Telephone Encounter (Signed)
Results reviewed with pt as per Dr. Krasowski's note.  Pt verbalized understanding and had no additional questions. Routed to PCP  

## 2022-04-27 NOTE — Telephone Encounter (Signed)
LVM regarding lab results 

## 2022-04-28 NOTE — Progress Notes (Signed)

## 2022-04-29 DIAGNOSIS — R001 Bradycardia, unspecified: Secondary | ICD-10-CM | POA: Diagnosis not present

## 2022-05-03 NOTE — Anesthesia Preprocedure Evaluation (Addendum)
Anesthesia Evaluation  Patient identified by MRN, date of birth, ID band Patient awake    Reviewed: Allergy & Precautions, NPO status , Patient's Chart, lab work & pertinent test results  Airway Mallampati: II  TM Distance: >3 FB Neck ROM: Full    Dental  (+) Edentulous Upper, Missing   Pulmonary former smoker,    Pulmonary exam normal        Cardiovascular hypertension, Pt. on medications Normal cardiovascular exam     Neuro/Psych negative neurological ROS  negative psych ROS   GI/Hepatic Neg liver ROS, GERD  Medicated and Controlled,  Endo/Other  negative endocrine ROS  Renal/GU negative Renal ROS     Musculoskeletal  (+) Arthritis ,   Abdominal   Peds  Hematology negative hematology ROS (+)   Anesthesia Other Findings RIGHT INGUINAL HERNIA RECURRENT  Reproductive/Obstetrics                            Anesthesia Physical Anesthesia Plan  ASA: 2  Anesthesia Plan: General and Regional   Post-op Pain Management: Regional block*   Induction: Intravenous  PONV Risk Score and Plan: 3 and Ondansetron, Dexamethasone, Midazolam and Treatment may vary due to age or medical condition  Airway Management Planned: Oral ETT  Additional Equipment:   Intra-op Plan:   Post-operative Plan: Extubation in OR  Informed Consent: I have reviewed the patients History and Physical, chart, labs and discussed the procedure including the risks, benefits and alternatives for the proposed anesthesia with the patient or authorized representative who has indicated his/her understanding and acceptance.     Dental advisory given  Plan Discussed with: CRNA  Anesthesia Plan Comments:        Anesthesia Quick Evaluation

## 2022-05-04 ENCOUNTER — Ambulatory Visit (HOSPITAL_BASED_OUTPATIENT_CLINIC_OR_DEPARTMENT_OTHER): Payer: BC Managed Care – PPO | Admitting: Anesthesiology

## 2022-05-04 ENCOUNTER — Ambulatory Visit (HOSPITAL_BASED_OUTPATIENT_CLINIC_OR_DEPARTMENT_OTHER)
Admission: RE | Admit: 2022-05-04 | Discharge: 2022-05-04 | Disposition: A | Discharge status: Home / Self Care | Payer: BC Managed Care – PPO | Attending: General Surgery | Admitting: General Surgery

## 2022-05-04 ENCOUNTER — Other Ambulatory Visit: Payer: Self-pay

## 2022-05-04 ENCOUNTER — Encounter (HOSPITAL_BASED_OUTPATIENT_CLINIC_OR_DEPARTMENT_OTHER): Payer: Self-pay | Admitting: General Surgery

## 2022-05-04 ENCOUNTER — Encounter (HOSPITAL_BASED_OUTPATIENT_CLINIC_OR_DEPARTMENT_OTHER)
Admission: RE | Disposition: A | Discharge status: Home / Self Care | Payer: Self-pay | Source: Home / Self Care | Attending: General Surgery

## 2022-05-04 DIAGNOSIS — I10 Essential (primary) hypertension: Secondary | ICD-10-CM | POA: Insufficient documentation

## 2022-05-04 DIAGNOSIS — Z87891 Personal history of nicotine dependence: Secondary | ICD-10-CM | POA: Diagnosis not present

## 2022-05-04 DIAGNOSIS — Z79899 Other long term (current) drug therapy: Secondary | ICD-10-CM | POA: Insufficient documentation

## 2022-05-04 DIAGNOSIS — Z01818 Encounter for other preprocedural examination: Secondary | ICD-10-CM

## 2022-05-04 DIAGNOSIS — K4091 Unilateral inguinal hernia, without obstruction or gangrene, recurrent: Secondary | ICD-10-CM | POA: Insufficient documentation

## 2022-05-04 DIAGNOSIS — M1990 Unspecified osteoarthritis, unspecified site: Secondary | ICD-10-CM | POA: Diagnosis not present

## 2022-05-04 DIAGNOSIS — G8918 Other acute postprocedural pain: Secondary | ICD-10-CM | POA: Diagnosis not present

## 2022-05-04 DIAGNOSIS — K219 Gastro-esophageal reflux disease without esophagitis: Secondary | ICD-10-CM | POA: Insufficient documentation

## 2022-05-04 HISTORY — PX: INSERTION OF MESH: SHX5868

## 2022-05-04 HISTORY — DX: Nonrheumatic mitral (valve) insufficiency: I34.0

## 2022-05-04 HISTORY — PX: INGUINAL HERNIA REPAIR: SHX194

## 2022-05-04 SURGERY — REPAIR, HERNIA, INGUINAL, ADULT
Anesthesia: Regional | Site: Groin | Laterality: Right

## 2022-05-04 MED ORDER — ONDANSETRON HCL 4 MG/2ML IJ SOLN: 4.0000 mg | Freq: Once | INTRAMUSCULAR | Status: DC | PRN

## 2022-05-04 MED ORDER — GABAPENTIN 300 MG PO CAPS
ORAL_CAPSULE | ORAL | Status: AC
  Filled 2022-05-04: qty 1

## 2022-05-04 MED ORDER — ACETAMINOPHEN 500 MG PO TABS
ORAL_TABLET | ORAL | Status: AC
  Filled 2022-05-04: qty 2

## 2022-05-04 MED ORDER — AMISULPRIDE (ANTIEMETIC) 5 MG/2ML IV SOLN: 10.0000 mg | Freq: Once | INTRAVENOUS | Status: DC | PRN

## 2022-05-04 MED ORDER — CEFAZOLIN SODIUM-DEXTROSE 2-4 GM/100ML-% IV SOLN
INTRAVENOUS | Status: AC
  Filled 2022-05-04: qty 100

## 2022-05-04 MED ORDER — OXYCODONE HCL 5 MG PO TABS: 5.0000 mg | ORAL_TABLET | Freq: Four times a day (QID) | ORAL | 0 refills | Status: DC | PRN

## 2022-05-04 MED ORDER — ACETAMINOPHEN 500 MG PO TABS
1000.0000 mg | ORAL_TABLET | ORAL | Status: AC
  Administered 2022-05-04: 1000 mg via ORAL

## 2022-05-04 MED ORDER — ONDANSETRON HCL 4 MG/2ML IJ SOLN
INTRAMUSCULAR | Status: DC | PRN
  Administered 2022-05-04: 4 mg via INTRAVENOUS

## 2022-05-04 MED ORDER — BUPIVACAINE-EPINEPHRINE 0.25% -1:200000 IJ SOLN
INTRAMUSCULAR | Status: DC | PRN
  Administered 2022-05-04: 5 mL

## 2022-05-04 MED ORDER — FENTANYL CITRATE (PF) 100 MCG/2ML IJ SOLN
INTRAMUSCULAR | Status: AC
  Filled 2022-05-04: qty 2

## 2022-05-04 MED ORDER — CELECOXIB 200 MG PO CAPS
200.0000 mg | ORAL_CAPSULE | ORAL | Status: AC
  Administered 2022-05-04: 200 mg via ORAL

## 2022-05-04 MED ORDER — MIDAZOLAM HCL 2 MG/2ML IJ SOLN
INTRAMUSCULAR | Status: AC
  Filled 2022-05-04: qty 2

## 2022-05-04 MED ORDER — FENTANYL CITRATE (PF) 100 MCG/2ML IJ SOLN
100.0000 ug | Freq: Once | INTRAMUSCULAR | Status: AC
  Administered 2022-05-04: 50 ug via INTRAVENOUS

## 2022-05-04 MED ORDER — EPHEDRINE SULFATE-NACL 50-0.9 MG/10ML-% IV SOSY
PREFILLED_SYRINGE | INTRAVENOUS | Status: DC | PRN
  Administered 2022-05-04: 10 mg via INTRAVENOUS

## 2022-05-04 MED ORDER — LACTATED RINGERS IV SOLN: INTRAVENOUS | Status: DC

## 2022-05-04 MED ORDER — ROCURONIUM BROMIDE 10 MG/ML (PF) SYRINGE
PREFILLED_SYRINGE | INTRAVENOUS | Status: DC | PRN
  Administered 2022-05-04: 40 mg via INTRAVENOUS

## 2022-05-04 MED ORDER — MIDAZOLAM HCL 2 MG/2ML IJ SOLN
2.0000 mg | Freq: Once | INTRAMUSCULAR | Status: AC
  Administered 2022-05-04: 1 mg via INTRAVENOUS

## 2022-05-04 MED ORDER — SUGAMMADEX SODIUM 200 MG/2ML IV SOLN
INTRAVENOUS | Status: DC | PRN
  Administered 2022-05-04: 200 mg via INTRAVENOUS

## 2022-05-04 MED ORDER — PROPOFOL 500 MG/50ML IV EMUL
INTRAVENOUS | Status: AC
  Filled 2022-05-04: qty 50

## 2022-05-04 MED ORDER — CHLORHEXIDINE GLUCONATE CLOTH 2 % EX PADS: 6.0000 | MEDICATED_PAD | Freq: Once | CUTANEOUS | Status: DC

## 2022-05-04 MED ORDER — ROPIVACAINE HCL 5 MG/ML IJ SOLN
INTRAMUSCULAR | Status: DC | PRN
  Administered 2022-05-04: 30 mL via PERINEURAL

## 2022-05-04 MED ORDER — CELECOXIB 200 MG PO CAPS
ORAL_CAPSULE | ORAL | Status: AC
  Filled 2022-05-04: qty 1

## 2022-05-04 MED ORDER — BUPIVACAINE-EPINEPHRINE (PF) 0.25% -1:200000 IJ SOLN
INTRAMUSCULAR | Status: AC
  Filled 2022-05-04: qty 120

## 2022-05-04 MED ORDER — GABAPENTIN 300 MG PO CAPS
300.0000 mg | ORAL_CAPSULE | ORAL | Status: AC
  Administered 2022-05-04: 300 mg via ORAL

## 2022-05-04 MED ORDER — CEFAZOLIN SODIUM-DEXTROSE 2-4 GM/100ML-% IV SOLN
2.0000 g | INTRAVENOUS | Status: AC
  Administered 2022-05-04: 2 g via INTRAVENOUS

## 2022-05-04 MED ORDER — PROPOFOL 10 MG/ML IV BOLUS
INTRAVENOUS | Status: DC | PRN
  Administered 2022-05-04: 100 mg via INTRAVENOUS

## 2022-05-04 MED ORDER — LIDOCAINE 2% (20 MG/ML) 5 ML SYRINGE
INTRAMUSCULAR | Status: DC | PRN
  Administered 2022-05-04: 40 mg via INTRAVENOUS

## 2022-05-04 MED ORDER — FENTANYL CITRATE (PF) 100 MCG/2ML IJ SOLN
25.0000 ug | INTRAMUSCULAR | Status: DC | PRN
  Administered 2022-05-04: 25 ug via INTRAVENOUS

## 2022-05-04 SURGICAL SUPPLY — 46 items
ADH SKN CLS APL DERMABOND .7 (GAUZE/BANDAGES/DRESSINGS) ×1
APL PRP STRL LF DISP 70% ISPRP (MISCELLANEOUS) ×1
APL SKNCLS STERI-STRIP NONHPOA (GAUZE/BANDAGES/DRESSINGS)
BENZOIN TINCTURE PRP APPL 2/3 (GAUZE/BANDAGES/DRESSINGS) IMPLANT
BLADE CLIPPER SURG (BLADE) IMPLANT
BLADE HEX COATED 2.75 (ELECTRODE) ×1 IMPLANT
BLADE SURG 15 STRL LF DISP TIS (BLADE) ×1 IMPLANT
BLADE SURG 15 STRL SS (BLADE) ×1
CHLORAPREP W/TINT 26 (MISCELLANEOUS) ×1 IMPLANT
COVER BACK TABLE 60X90IN (DRAPES) ×1 IMPLANT
COVER MAYO STAND STRL (DRAPES) ×1 IMPLANT
DERMABOND ADVANCED .7 DNX12 (GAUZE/BANDAGES/DRESSINGS) ×1 IMPLANT
DRAIN PENROSE .5X12 LATEX STL (DRAIN) ×1 IMPLANT
DRAPE LAPAROTOMY TRNSV 102X78 (DRAPES) ×1 IMPLANT
DRAPE UTILITY XL STRL (DRAPES) ×1 IMPLANT
ELECT REM PT RETURN 9FT ADLT (ELECTROSURGICAL) ×1
ELECTRODE REM PT RTRN 9FT ADLT (ELECTROSURGICAL) ×1 IMPLANT
GLOVE BIO SURGEON STRL SZ 6.5 (GLOVE) IMPLANT
GLOVE BIO SURGEON STRL SZ7.5 (GLOVE) ×1 IMPLANT
GOWN STRL REUS W/ TWL LRG LVL3 (GOWN DISPOSABLE) ×2 IMPLANT
GOWN STRL REUS W/TWL LRG LVL3 (GOWN DISPOSABLE) ×2
MESH ULTRAPRO 3X6 7.6X15CM (Mesh General) IMPLANT
NDL HYPO 25X1 1.5 SAFETY (NEEDLE) ×1 IMPLANT
NEEDLE HYPO 25X1 1.5 SAFETY (NEEDLE) ×1 IMPLANT
NS IRRIG 1000ML POUR BTL (IV SOLUTION) ×1 IMPLANT
PACK BASIN DAY SURGERY FS (CUSTOM PROCEDURE TRAY) ×1 IMPLANT
PENCIL SMOKE EVACUATOR (MISCELLANEOUS) ×1 IMPLANT
SLEEVE SCD COMPRESS KNEE MED (STOCKING) ×1 IMPLANT
SPIKE FLUID TRANSFER (MISCELLANEOUS) IMPLANT
SPONGE T-LAP 18X18 ~~LOC~~+RFID (SPONGE) ×1 IMPLANT
STRIP CLOSURE SKIN 1/2X4 (GAUZE/BANDAGES/DRESSINGS) IMPLANT
SUT MON AB 4-0 PC3 18 (SUTURE) ×1 IMPLANT
SUT PROLENE 2 0 SH DA (SUTURE) ×2 IMPLANT
SUT SILK 2 0 SH (SUTURE) IMPLANT
SUT SILK 3 0 SH 30 (SUTURE) IMPLANT
SUT SILK 3 0 TIES 17X18 (SUTURE) ×1
SUT SILK 3-0 18XBRD TIE BLK (SUTURE) ×1 IMPLANT
SUT VIC AB 0 CT1 27 (SUTURE)
SUT VIC AB 0 CT1 27XBRD ANBCTR (SUTURE) IMPLANT
SUT VIC AB 2-0 SH 27 (SUTURE) ×1
SUT VIC AB 2-0 SH 27XBRD (SUTURE) ×1 IMPLANT
SUT VIC AB 3-0 SH 27 (SUTURE) ×1
SUT VIC AB 3-0 SH 27X BRD (SUTURE) ×1 IMPLANT
SUT VICRYL 0 SH 27 (SUTURE) IMPLANT
SYR CONTROL 10ML LL (SYRINGE) ×1 IMPLANT
TOWEL GREEN STERILE FF (TOWEL DISPOSABLE) ×2 IMPLANT

## 2022-05-04 NOTE — Anesthesia Procedure Notes (Signed)
Anesthesia Regional Block: TAP block   Pre-Anesthetic Checklist: , timeout performed,  Correct Patient, Correct Site, Correct Laterality,  Correct Procedure, Correct Position, site marked,  Risks and benefits discussed,  Surgical consent,  Pre-op evaluation,  At surgeon's request and post-op pain management  Laterality: Right  Prep: chloraprep       Needles:  Injection technique: Single-shot  Needle Type: Echogenic Stimulator Needle     Needle Length: 10cm  Needle Gauge: 20     Additional Needles:   Procedures:,,,, ultrasound used (permanent image in chart),,    Narrative:  Start time: 05/04/2022 7:15 AM End time: 05/04/2022 7:25 AM Injection made incrementally with aspirations every 5 mL.  Performed by: Personally  Anesthesiologist: Murvin Natal, MD  Additional Notes: Functioning IV was confirmed and monitors were applied.  A timeout was performed. Sterile prep, hand hygiene and sterile gloves were used. A 113m 20ga Bbraun echogenic stimulator needle was used. Negative aspiration and negative test dose prior to incremental administration of local anesthetic. The patient tolerated the procedure well.  Ultrasound guidance: relevent anatomy identified, needle position confirmed, local anesthetic spread visualized around nerve(s), vascular puncture avoided.  Image printed for medical record.

## 2022-05-04 NOTE — Op Note (Signed)
05/04/2022  9:21 AM  PATIENT:  James Atkinson  76 y.o. male  PRE-OPERATIVE DIAGNOSIS:  RIGHT INGUINAL HERNIA RECURRENT  POST-OPERATIVE DIAGNOSIS:  RIGHT INGUINAL HERNIA RECURRENT  PROCEDURE:  Procedure(s): RIGHT INGUINAL HERNIA REPAIR WITH MESH (Right) INSERTION OF MESH (Right)  SURGEON:  Surgeon(s) and Role:    * Jovita Kussmaul, MD - Primary  PHYSICIAN ASSISTANT:   ASSISTANTS: none   ANESTHESIA:   local and general  EBL:  minimal   BLOOD ADMINISTERED:none  DRAINS: none   LOCAL MEDICATIONS USED:  MARCAINE     SPECIMEN:  No Specimen  DISPOSITION OF SPECIMEN:  N/A  COUNTS:  YES  TOURNIQUET:  * No tourniquets in log *  DICTATION: .Dragon Dictation  After informed consent was obtained the patient was brought to the operating room and placed in the supine position on the operating table.  After adequate induction of general anesthesia the patient's abdomen and right groin were prepped with ChloraPrep, allowed to dry, and draped in usual sterile manner.  An appropriate timeout was performed.  The right groin area was then infiltrated with quarter percent Marcaine.  A small transversely oriented incision was made with a 15 blade knife from the edge of the pubic tubercle on the right towards the anterior superior iliac spine.  The incision was carried through the skin and subcutaneous tissue sharply with the electrocautery until the fascia of the external oblique was encountered.  A small bridging vein was clamped with hemostats, divided, and ligated with 3-0 silk ties.  The external oblique fascia was then opened along its fibers towards the apex of the external ring with a 15 blade knife and Metzenbaum scissors.  Wheatland retractor was deployed.  Blunt dissection was carried out of the cord structures until they could be surrounded between 2 fingers.  1/2 inch Penrose drain was placed around the cord structures for retraction purposes.  The cord was then gently skeletonized.  The  vas deferens and testicular vessels were identified and care was taken to avoid any injury to the structures.  A small hernia sac was also identified and gently separated from the rest of the cord structures.  There was some fatty structure within the sac that would not reduce.  Because of this I elected to reduce the entire sac and then repair the floor with interrupted 0 Vicryl stitches.  A 3 x 6 piece of ultra Pro mesh was then chosen and cut to the appropriate size.  The mesh was sewed inferiorly to the shelving edge of the inguinal ligament with a running 2-0 Prolene stitch.  Tails were cut in the mesh laterally and the tails were wrapped around the cord structures.  Medially and superiorly the mesh was sewed to the muscular aponeurotic strength of the transversalis with interrupted 2-0 Prolene vertical mattress stitches.  The lateral to the cord the tails of the mesh were anchored to the shelving edge of the inguinal ligament with interrupted 2-0 Prolene stitches.  Once this was accomplished the mesh appeared to be in good position and the hernia seem well repaired.  The wound was irrigated with copious amounts of saline.  During the dissection the ilioinguinal nerve was identified and involved with some scar tissue.  It was dissected out proximally and distally, clamped, divided, and ligated with 3-0 silk ties.  Next the external bleak fascia was reapproximated with a running 2-0 Vicryl stitch.  The subcutaneous fascia was then closed with a running 3-0 Vicryl stitch.  The skin  was then closed with a running 4-0 Monocryl subcuticular stitch.  Dermabond dressings were applied.  The patient tolerated the procedure well.  At the end of the case all needle sponge and instrument counts were correct.  The patient was then awakened and taken to recovery in stable condition.  Patient's testicle was in the scrotum at the end of the case.  PLAN OF CARE: Discharge to home after PACU  PATIENT DISPOSITION:  PACU -  hemodynamically stable.   Delay start of Pharmacological VTE agent (>24hrs) due to surgical blood loss or risk of bleeding: not applicable

## 2022-05-04 NOTE — Anesthesia Procedure Notes (Signed)
Procedure Name: Intubation Date/Time: 05/04/2022 8:16 AM  Performed by: [place] Manus Rudd, CRNAPre-anesthesia Checklist: Patient identified, Emergency Drugs available, Suction available and Patient being monitored Patient Re-evaluated:Patient Re-evaluated prior to induction Oxygen Delivery Method: Circle system utilized Preoxygenation: Pre-oxygenation with 100% oxygen Induction Type: IV induction Ventilation: Mask ventilation without difficulty Laryngoscope Size: Mac Tube type: Oral Tube size: 7.5 mm Number of attempts: 1 Airway Equipment and Method: Stylet and Oral airway Placement Confirmation: ETT inserted through vocal cords under direct vision, positive ETCO2 and breath sounds checked- equal and bilateral Secured at: 20 cm Tube secured with: Tape Dental Injury: Teeth and Oropharynx as per pre-operative assessment

## 2022-05-04 NOTE — Discharge Instructions (Addendum)
May take Tylenol after 1:15pm, if needed.  May take NSAIDS (Ibuprofen, motrin) after 1:15pm, if needed.   Post Anesthesia Home Care Instructions  Activity: Get plenty of rest for the remainder of the day. A responsible individual must stay with you for 24 hours following the procedure.  For the next 24 hours, DO NOT: -Drive a car -Paediatric nurse -Drink alcoholic beverages -Take any medication unless instructed by your physician -Make any legal decisions or sign important papers.  Meals: Start with liquid foods such as gelatin or soup. Progress to regular foods as tolerated. Avoid greasy, spicy, heavy foods. If nausea and/or vomiting occur, drink only clear liquids until the nausea and/or vomiting subsides. Call your physician if vomiting continues.  Special Instructions/Symptoms: Your throat may feel dry or sore from the anesthesia or the breathing tube placed in your throat during surgery. If this causes discomfort, gargle with warm salt water. The discomfort should disappear within 24 hours.  If you had a scopolamine patch placed behind your ear for the management of post- operative nausea and/or vomiting:  1. The medication in the patch is effective for 72 hours, after which it should be removed.  Wrap patch in a tissue and discard in the trash. Wash hands thoroughly with soap and water. 2. You may remove the patch earlier than 72 hours if you experience unpleasant side effects which may include dry mouth, dizziness or visual disturbances. 3. Avoid touching the patch. Wash your hands with soap and water after contact with the patch.    Regional Anesthesia Blocks  1. Numbness or the inability to move the "blocked" extremity may last from 3-48 hours after placement. The length of time depends on the medication injected and your individual response to the medication. If the numbness is not going away after 48 hours, call your surgeon.  2. The extremity that is blocked will need to be  protected until the numbness is gone and the  Strength has returned. Because you cannot feel it, you will need to take extra care to avoid injury. Because it may be weak, you may have difficulty moving it or using it. You may not know what position it is in without looking at it while the block is in effect.  3. For blocks in the legs and feet, returning to weight bearing and walking needs to be done carefully. You will need to wait until the numbness is entirely gone and the strength has returned. You should be able to move your leg and foot normally before you try and bear weight or walk. You will need someone to be with you when you first try to ensure you do not fall and possibly risk injury.  4. Bruising and tenderness at the needle site are common side effects and will resolve in a few days.  5. Persistent numbness or new problems with movement should be communicated to the surgeon or the [facility] [phone] [place] [phone]).

## 2022-05-04 NOTE — Progress Notes (Signed)
Assisted Dr. Roanna Banning with right, transabdominal plane, ultrasound guided block. Side rails up, monitors on throughout procedure. See vital signs in flow sheet. Tolerated Procedure well.

## 2022-05-04 NOTE — Interval H&P Note (Signed)
History and Physical Interval Note:  05/04/2022 7:53 AM  James Atkinson  has presented today for surgery, with the diagnosis of RIGHT INGUINAL HERNIA RECURRENT.  The various methods of treatment have been discussed with the patient and family. After consideration of risks, benefits and other options for treatment, the patient has consented to  Procedure(s): RIGHT INGUINAL HERNIA REPAIR WITH MESH (Right) as a surgical intervention.  The patient's history has been reviewed, patient examined, no change in status, stable for surgery.  I have reviewed the patient's chart and labs.  Questions were answered to the patient's satisfaction.     [name]

## 2022-05-04 NOTE — Transfer of Care (Signed)
Immediate Anesthesia Transfer of Care Note  Patient: James Atkinson  Procedure(s) Performed: RIGHT INGUINAL HERNIA REPAIR WITH MESH (Right: Groin) INSERTION OF MESH (Right: Groin)  Patient Location: PACU  Anesthesia Type:GA combined with regional for post-op pain  Level of Consciousness: awake, alert  and drowsy  Airway & Oxygen Therapy: Patient Spontanous Breathing and Patient connected to face mask oxygen  Post-op Assessment: Report given to RN and Post -op Vital signs reviewed and stable  Post vital signs: Reviewed and stable  Last Vitals:  Vitals Value Taken Time  BP 157/76 05/04/22 0931  Temp    Pulse 54 05/04/22 0934  Resp 17 05/04/22 0934  SpO2 100 % 05/04/22 0934  Vitals shown include unvalidated device data.  Last Pain:  Vitals:   05/04/22 5208  TempSrc: Oral  PainSc: 0-No pain      Patients Stated Pain Goal: 4 (08/28/34 1224)  Complications: No notable events documented.

## 2022-05-04 NOTE — Anesthesia Postprocedure Evaluation (Signed)
Anesthesia Post Note  Patient: Marty Heck  Procedure(s) Performed: RIGHT INGUINAL HERNIA REPAIR WITH MESH (Right: Groin) INSERTION OF MESH (Right: Groin)     Patient location during evaluation: PACU Anesthesia Type: Regional and General Level of consciousness: awake Pain management: pain level controlled Vital Signs Assessment: post-procedure vital signs reviewed and stable Respiratory status: spontaneous breathing, nonlabored ventilation, respiratory function stable and patient connected to nasal cannula oxygen Cardiovascular status: blood pressure returned to baseline and stable Postop Assessment: no apparent nausea or vomiting Anesthetic complications: no   No notable events documented.  Last Vitals:  Vitals:   05/04/22 1000 05/04/22 1015  BP: 129/63 138/72  Pulse: 60 60  Resp: 19 18  Temp:  36.7 C  SpO2: 99% 97%    Last Pain:  Vitals:   05/04/22 1015  TempSrc:   PainSc: 3                  Dorismar Chay P Anneth Brunell

## 2022-05-04 NOTE — H&P (Signed)
REFERRING PHYSICIAN: Ernestine Conrad*  PROVIDER: Landry Corporal, MD  MRN: [id number] DOB: 03-Oct-1945 Subjective   Chief Complaint: Hernia   History of Present Illness: James Atkinson is a 76 y.o. male who is seen today as an office consultation for evaluation of Hernia .   We are asked to see the patient in consultation by Dr. Shirline Frees to evaluate him for a right inguinal hernia. The patient is a 76 year old white male who states that he had a laparoscopic bilateral inguinal hernia repair with mesh about 12 years ago. Over the last year he has had increasing swelling in the right groin. He has some occasional discomfort associated with it. He denies any nausea or vomiting. He denies any fevers or chills. He is otherwise in good health and quit smoking about 30 years ago.  Review of Systems: A complete review of systems was obtained from the patient. I have reviewed this information and discussed as appropriate with the patient. See HPI as well for other ROS.  ROS   Medical History: Past Medical History:  Diagnosis Date  Arthritis  GERD (gastroesophageal reflux disease)  Hyperlipidemia   There is no problem list on file for this patient.  Past Surgical History:  Procedure Laterality Date  HERNIA REPAIR  knee surgery    No Known Allergies  Current Outpatient Medications on File Prior to Visit  Medication Sig Dispense Refill  aspirin 81 MG EC tablet Take 81 mg by mouth once daily  atorvastatin (LIPITOR) 40 MG tablet  COLLAGEN MISC Use  ferrous sulfate 325 (65 FE) MG tablet 1 tablet  glucosamine/chondroitin/C/Mang (GLUCOSAMINE 1500 COMPLEX ORAL) Take by mouth  lisinopriL (ZESTRIL) 20 MG tablet  multivitamin capsule Take 1 capsule by mouth once daily  turmeric root extract 500 mg Cap Take 1 capsule by mouth once daily   No current facility-administered medications on file prior to visit.   History reviewed. No pertinent family history.   Social History    Tobacco Use  Smoking Status Former  Types: Cigarettes  Quit date: 1995  Years since quitting: 28.7  Smokeless Tobacco Never    Social History   Socioeconomic History  Marital status: Married  Tobacco Use  Smoking status: Former  Types: Cigarettes  Quit date: 1995  Years since quitting: 28.7  Smokeless tobacco: Never   Objective:   Vitals:  BP: 122/76  Pulse: 67  Weight: 65.7 kg (144 lb 12.8 oz)  Height: 167.6 cm ('5\' 6"'$ )   Body mass index is 23.37 kg/m.  Physical Exam Constitutional:  General: He is not in acute distress. Appearance: Normal appearance.  HENT:  Head: Normocephalic and atraumatic.  Right Ear: External ear normal.  Left Ear: External ear normal.  Nose: Nose normal.  Mouth/Throat:  Mouth: Mucous membranes are moist.  Pharynx: Oropharynx is clear.  Eyes:  General: No scleral icterus. Extraocular Movements: Extraocular movements intact.  Conjunctiva/sclera: Conjunctivae normal.  Pupils: Pupils are equal, round, and reactive to light.  Cardiovascular:  Rate and Rhythm: Normal rate and regular rhythm.  Pulses: Normal pulses.  Heart sounds: Normal heart sounds.  Pulmonary:  Effort: Pulmonary effort is normal. No respiratory distress.  Breath sounds: Normal breath sounds.  Abdominal:  General: Abdomen is flat. Bowel sounds are normal. There is no distension.  Palpations: Abdomen is soft.  Tenderness: There is no abdominal tenderness.  Genitourinary: Comments: There is a moderate sized reducible bulge in the right groin. There is no palpable bulge or impulse with straining in the left groin  Musculoskeletal:  General: No swelling or deformity. Normal range of motion.  Cervical back: Normal range of motion and neck supple. No tenderness.  Skin: General: Skin is warm and dry.  Coloration: Skin is not jaundiced.  Neurological:  General: No focal deficit present.  Mental Status: He is alert and oriented to person, place, and time.  Psychiatric:   Mood and Affect: Mood normal.  Behavior: Behavior normal.    Labs, Imaging and Diagnostic Testing:  Assessment and Plan:   Diagnoses and all orders for this visit:  Unilateral recurrent inguinal hernia without obstruction or gangrene    The patient appears to have a recurrent right inguinal hernia. Because of the risk of incarceration and strangulation I feel he would benefit from having this fixed. He would also like to have this done. I have discussed with him in detail the risks and benefits of the operation to fix the hernia as well as some of the technical aspects including the use of mesh and the risk of chronic pain and he understands and wishes to proceed.

## 2022-05-05 ENCOUNTER — Encounter (HOSPITAL_BASED_OUTPATIENT_CLINIC_OR_DEPARTMENT_OTHER): Payer: Self-pay | Admitting: General Surgery

## 2022-05-05 NOTE — Progress Notes (Signed)
Left message stating courtesy call and if any questions or concerns please call the doctors office.  

## 2022-06-05 ENCOUNTER — Telehealth: Payer: Self-pay

## 2022-06-05 NOTE — Telephone Encounter (Signed)
Patient notified of results.

## 2022-06-05 NOTE — Telephone Encounter (Signed)
-----   Message from Park Liter, MD sent at 05/14/2022  9:59 AM EST ----- Monitor did not show any dramatic arrhythmia.  I will continue present management

## 2022-06-10 DIAGNOSIS — D696 Thrombocytopenia, unspecified: Secondary | ICD-10-CM | POA: Diagnosis not present

## 2022-06-10 DIAGNOSIS — E7800 Pure hypercholesterolemia, unspecified: Secondary | ICD-10-CM | POA: Diagnosis not present

## 2022-06-10 DIAGNOSIS — R7303 Prediabetes: Secondary | ICD-10-CM | POA: Diagnosis not present

## 2022-06-10 DIAGNOSIS — I10 Essential (primary) hypertension: Secondary | ICD-10-CM | POA: Diagnosis not present

## 2022-06-10 DIAGNOSIS — Z0000 Encounter for general adult medical examination without abnormal findings: Secondary | ICD-10-CM | POA: Diagnosis not present

## 2022-06-30 DIAGNOSIS — M5412 Radiculopathy, cervical region: Secondary | ICD-10-CM | POA: Diagnosis not present

## 2022-07-14 ENCOUNTER — Other Ambulatory Visit: Payer: Self-pay | Admitting: Family Medicine

## 2022-07-14 DIAGNOSIS — M5412 Radiculopathy, cervical region: Secondary | ICD-10-CM

## 2022-07-30 ENCOUNTER — Ambulatory Visit
Admission: RE | Admit: 2022-07-30 | Discharge: 2022-07-30 | Disposition: A | Discharge status: Home / Self Care | Payer: BC Managed Care – PPO | Source: Ambulatory Visit | Attending: Family Medicine | Admitting: Family Medicine

## 2022-07-30 DIAGNOSIS — M542 Cervicalgia: Secondary | ICD-10-CM | POA: Diagnosis not present

## 2022-07-30 DIAGNOSIS — M5412 Radiculopathy, cervical region: Secondary | ICD-10-CM

## 2022-07-30 IMAGING — CR DG LUMBAR SPINE 2-3V
3 series · 3 of 3 positions shown · non-contrast
Comparison: Lumbar spine x-ray 03/08/2006.

CLINICAL DATA: Acute low back pain.

EXAM:
LUMBAR SPINE - 2-3 VIEW

[w lumbar spine ap]
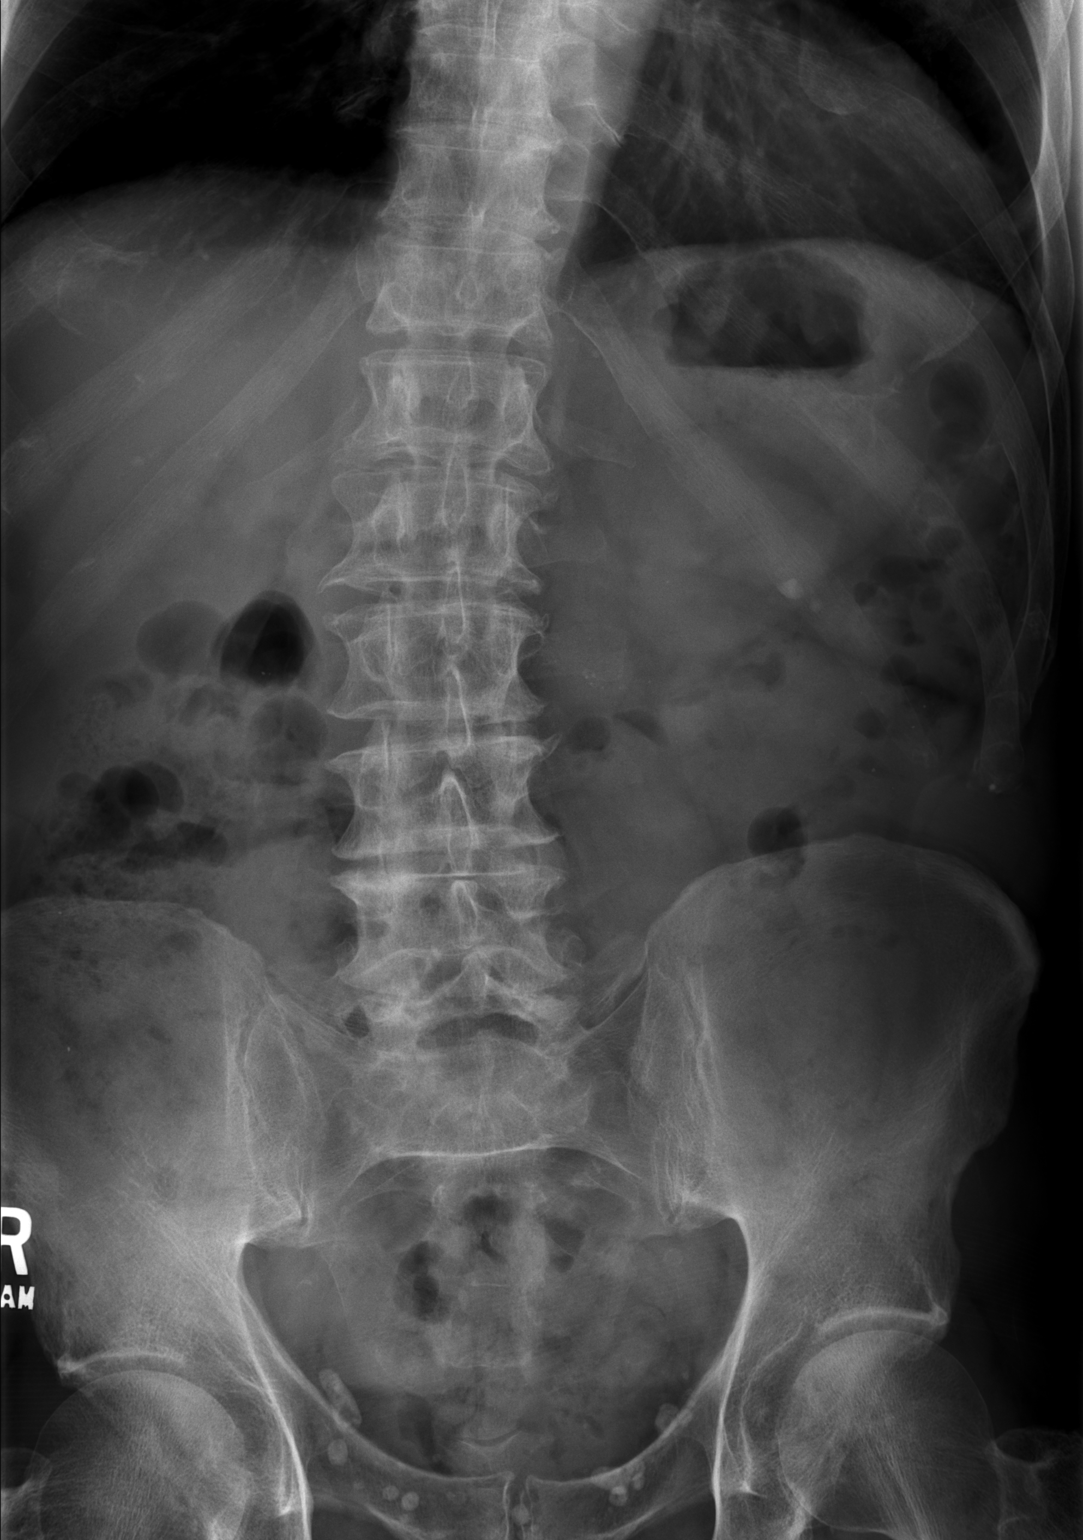

[w lumbar spine lat]
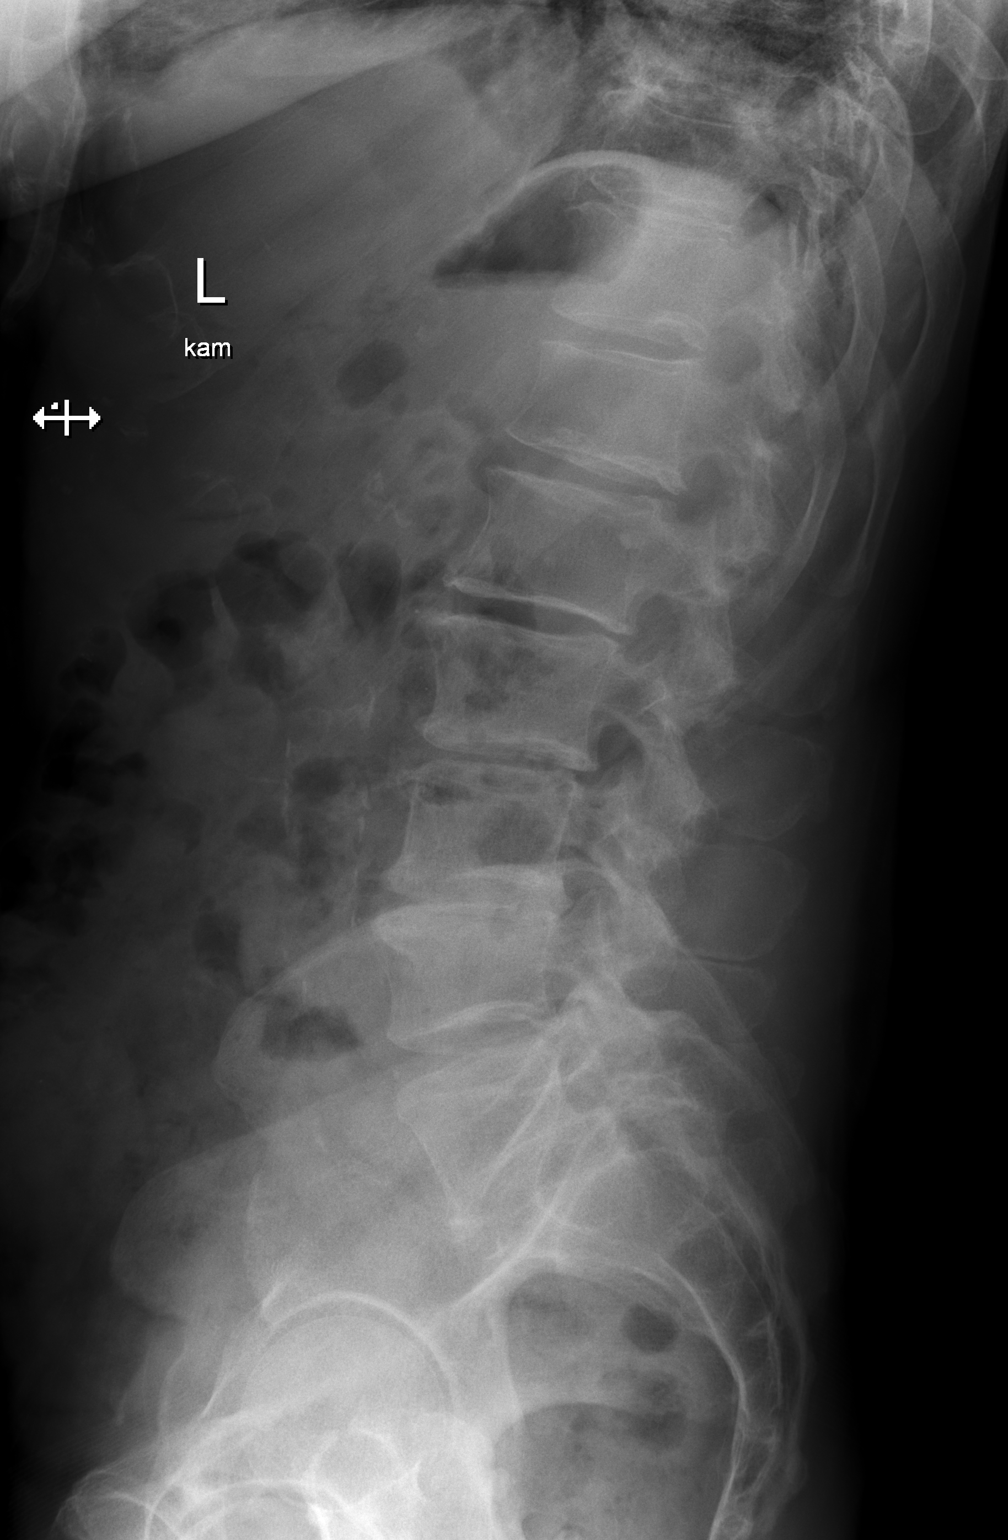

[w lumbar l-5 s-1 spot]
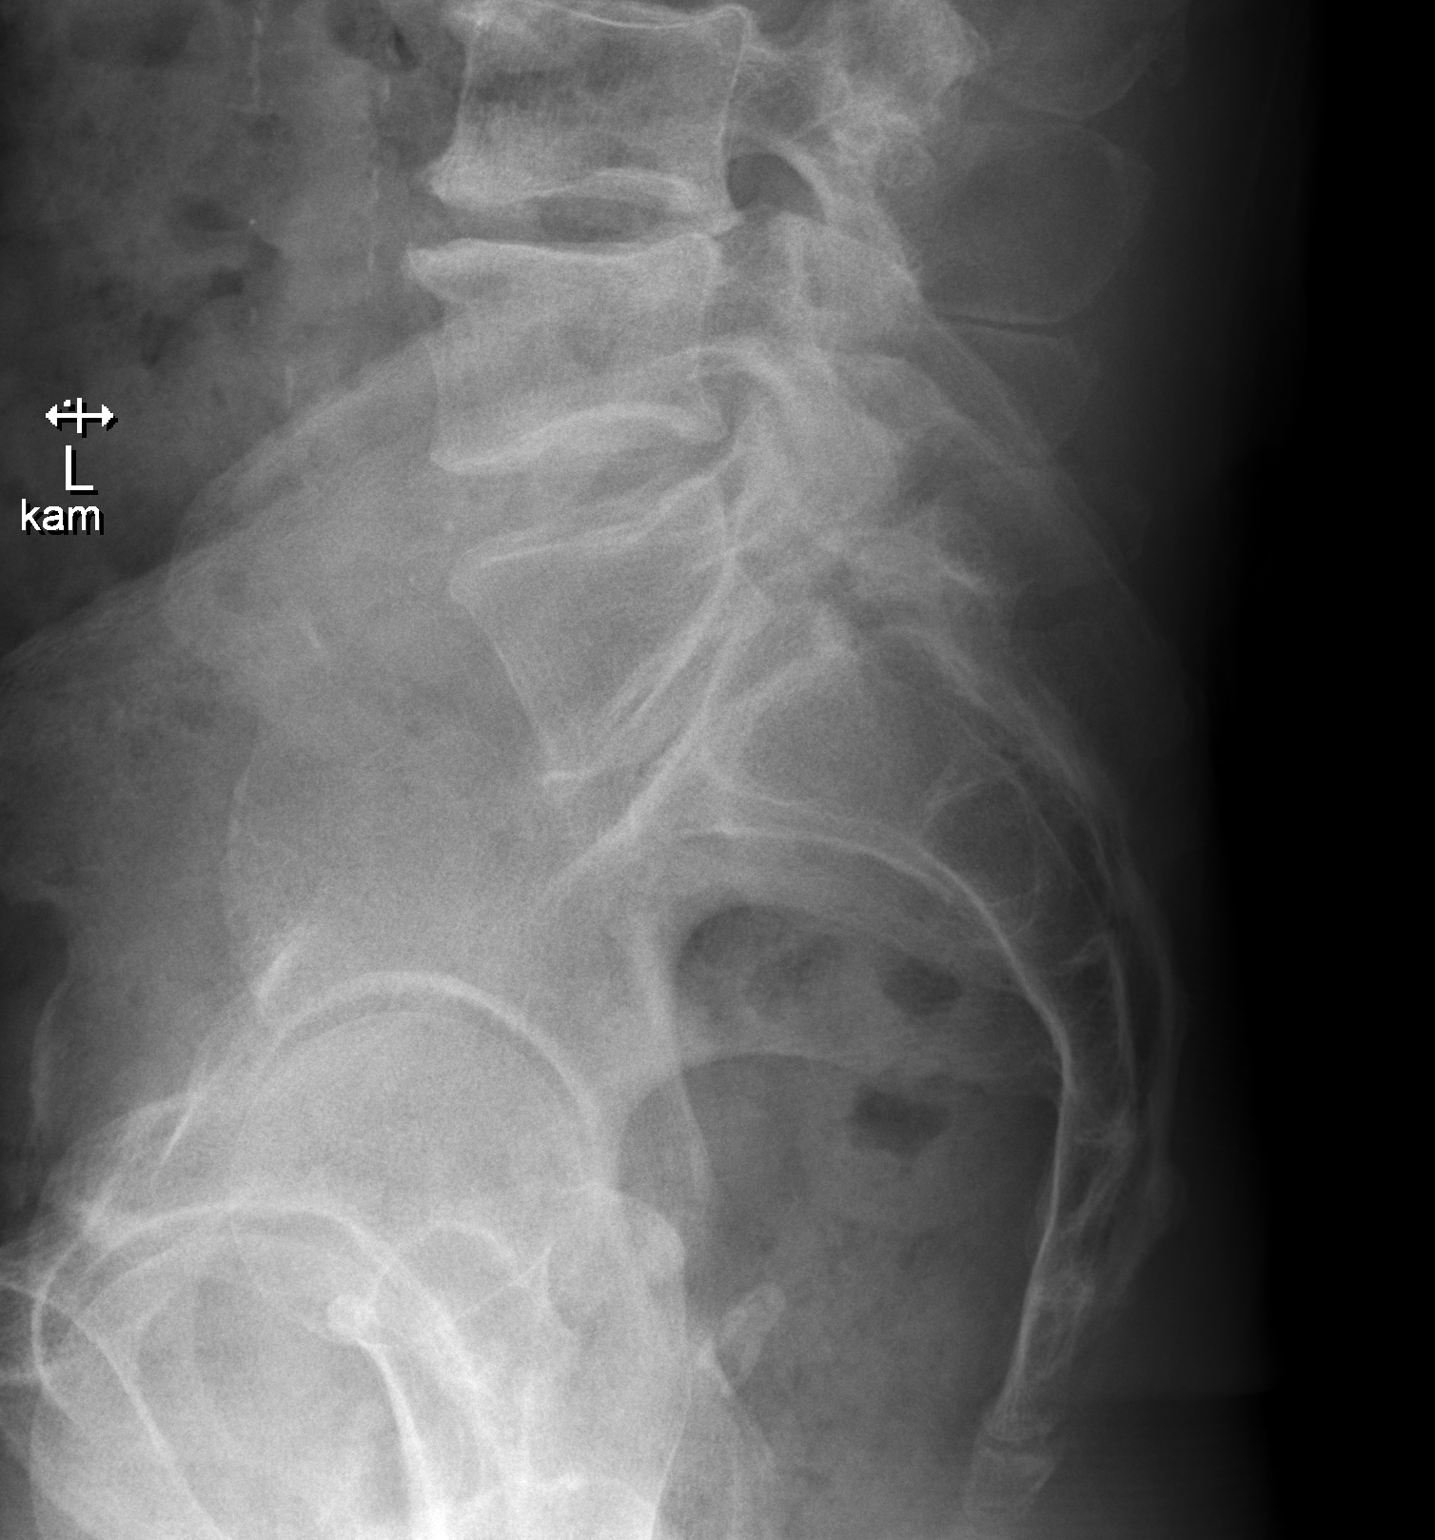

[3 of 3 positions shown; findings below may reference images not displayed]

FINDINGS: Spinal alignment is within normal limits. There is moderate disc
space narrowing at L5-S1 with endplate osteophyte formation
compatible with degenerative change. This is increased from prior
examination. There is also new/increasing mild disc space narrowing
and endplate osteophyte formation at T12-L1, L1-L2, L2-L3 and L3-L4
compatible with degenerative change. No focal osseous lesions are
identified.

There are atherosclerotic calcifications of the aorta.
IMPRESSION: 1. No acute bony abnormality.
2. Mild-to-moderate degenerative changes throughout the lumbar spine
have increased compared to 3222.

## 2022-08-07 DIAGNOSIS — M50121 Cervical disc disorder at C4-C5 level with radiculopathy: Secondary | ICD-10-CM | POA: Diagnosis not present

## 2022-08-07 DIAGNOSIS — M50122 Cervical disc disorder at C5-C6 level with radiculopathy: Secondary | ICD-10-CM | POA: Diagnosis not present

## 2022-10-16 DIAGNOSIS — H5203 Hypermetropia, bilateral: Secondary | ICD-10-CM | POA: Diagnosis not present

## 2022-10-16 DIAGNOSIS — H2513 Age-related nuclear cataract, bilateral: Secondary | ICD-10-CM | POA: Diagnosis not present

## 2022-10-28 ENCOUNTER — Other Ambulatory Visit (HOSPITAL_COMMUNITY): Payer: Self-pay

## 2022-10-28 MED ORDER — ATORVASTATIN CALCIUM 40 MG PO TABS
40.0000 mg | ORAL_TABLET | Freq: Every day | ORAL | 0 refills | Status: DC
  Filled 2022-10-28: qty 90, 90d supply, fill #0

## 2022-10-28 MED ORDER — LISINOPRIL 20 MG PO TABS
20.0000 mg | ORAL_TABLET | Freq: Every day | ORAL | 0 refills | Status: DC
  Filled 2022-10-28: qty 90, 90d supply, fill #0

## 2022-11-11 DIAGNOSIS — H2511 Age-related nuclear cataract, right eye: Secondary | ICD-10-CM | POA: Diagnosis not present

## 2022-11-11 DIAGNOSIS — H25811 Combined forms of age-related cataract, right eye: Secondary | ICD-10-CM | POA: Diagnosis not present

## 2022-12-10 DIAGNOSIS — E7800 Pure hypercholesterolemia, unspecified: Secondary | ICD-10-CM | POA: Diagnosis not present

## 2022-12-10 DIAGNOSIS — R7303 Prediabetes: Secondary | ICD-10-CM | POA: Diagnosis not present

## 2022-12-10 DIAGNOSIS — K219 Gastro-esophageal reflux disease without esophagitis: Secondary | ICD-10-CM | POA: Diagnosis not present

## 2022-12-10 DIAGNOSIS — I10 Essential (primary) hypertension: Secondary | ICD-10-CM | POA: Diagnosis not present

## 2022-12-10 DIAGNOSIS — D696 Thrombocytopenia, unspecified: Secondary | ICD-10-CM | POA: Diagnosis not present

## 2022-12-23 ENCOUNTER — Encounter: Payer: Self-pay | Admitting: Gastroenterology

## 2023-01-28 DIAGNOSIS — K4091 Unilateral inguinal hernia, without obstruction or gangrene, recurrent: Secondary | ICD-10-CM | POA: Diagnosis not present

## 2023-02-17 ENCOUNTER — Ambulatory Visit: Payer: BC Managed Care – PPO | Admitting: Gastroenterology

## 2023-02-22 ENCOUNTER — Other Ambulatory Visit (HOSPITAL_BASED_OUTPATIENT_CLINIC_OR_DEPARTMENT_OTHER): Payer: Self-pay

## 2023-02-22 MED ORDER — LISINOPRIL 20 MG PO TABS
20.0000 mg | ORAL_TABLET | Freq: Every day | ORAL | 2 refills | Status: AC
  Filled 2023-02-22: qty 90, 90d supply, fill #0
  Filled 2023-05-11: qty 90, 90d supply, fill #1
  Filled 2023-08-18: qty 90, 90d supply, fill #2

## 2023-02-22 MED ORDER — ATORVASTATIN CALCIUM 40 MG PO TABS
40.0000 mg | ORAL_TABLET | Freq: Every day | ORAL | 3 refills | Status: AC
  Filled 2023-02-22: qty 90, 90d supply, fill #0
  Filled 2023-05-11: qty 90, 90d supply, fill #1
  Filled 2023-09-27: qty 90, 90d supply, fill #2
  Filled 2023-12-20: qty 90, 90d supply, fill #3

## 2023-03-05 ENCOUNTER — Other Ambulatory Visit (HOSPITAL_BASED_OUTPATIENT_CLINIC_OR_DEPARTMENT_OTHER): Payer: Self-pay

## 2023-03-05 MED ORDER — VALACYCLOVIR HCL 1 G PO TABS
2000.0000 mg | ORAL_TABLET | Freq: Two times a day (BID) | ORAL | 1 refills | Status: AC | PRN
  Filled 2023-03-05: qty 30, 8d supply, fill #0

## 2023-03-22 DIAGNOSIS — M65342 Trigger finger, left ring finger: Secondary | ICD-10-CM | POA: Diagnosis not present

## 2023-03-29 DIAGNOSIS — M65342 Trigger finger, left ring finger: Secondary | ICD-10-CM | POA: Insufficient documentation

## 2023-04-13 DIAGNOSIS — M65342 Trigger finger, left ring finger: Secondary | ICD-10-CM | POA: Diagnosis not present

## 2023-04-27 DIAGNOSIS — H2512 Age-related nuclear cataract, left eye: Secondary | ICD-10-CM | POA: Diagnosis not present

## 2023-04-28 ENCOUNTER — Encounter: Payer: Self-pay | Admitting: Gastroenterology

## 2023-04-28 ENCOUNTER — Ambulatory Visit: Payer: PPO | Admitting: Gastroenterology

## 2023-04-28 VITALS — BP 150/98 | HR 70 | Ht 66.5 in | Wt 150.5 lb

## 2023-04-28 DIAGNOSIS — Z8719 Personal history of other diseases of the digestive system: Secondary | ICD-10-CM | POA: Diagnosis not present

## 2023-04-28 DIAGNOSIS — R1319 Other dysphagia: Secondary | ICD-10-CM

## 2023-04-28 NOTE — Patient Instructions (Signed)
Continue Pantoprazole 40 mg daily.   You have been scheduled for an endoscopy. Please follow written instructions given to you at your visit today.  If you use inhalers (even only as needed), please bring them with you on the day of your procedure.  If you take any of the following medications, they will need to be adjusted prior to your procedure:   DO NOT TAKE 7 DAYS PRIOR TO TEST- Trulicity (dulaglutide) Ozempic, Wegovy (semaglutide) Mounjaro (tirzepatide) Bydureon Bcise (exanatide extended release)  DO NOT TAKE 1 DAY PRIOR TO YOUR TEST Rybelsus (semaglutide) Adlyxin (lixisenatide) Victoza (liraglutide) Byetta (exanatide) ______________________________________________________________  _______________________________________________________  If your blood pressure at your visit was 140/90 or greater, please contact your primary care physician to follow up on this.  _______________________________________________________  If you are age 77 or older, your body mass index should be between 23-30. Your Body mass index is 23.93 kg/m. If this is out of the aforementioned range listed, please consider follow up with your Primary Care Provider.  If you are age 58 or younger, your body mass index should be between 19-25. Your Body mass index is 23.93 kg/m. If this is out of the aformentioned range listed, please consider follow up with your Primary Care Provider.   ________________________________________________________  The [facility] providers would like to encourage you to use [facility] to communicate with providers for non-urgent requests or questions.  Due to long hold times on the telephone, sending your provider a message by [facility] may be a faster and more efficient way to get a response.  Please allow 48 business hours for a response.  Please remember that this is for non-urgent requests.  _______________________________________________________

## 2023-04-28 NOTE — Progress Notes (Signed)
04/28/2023 James Atkinson [id number] 30-Oct-1945   HISTORY OF PRESENT ILLNESS: This is a 77 year old male who is a patient of Dr. Marvell Fuller.  He is here today for evaluation of dysphagia.  He says that for about the past year he has been having swallowing issues again.  Pretty much everything that he eats feels like it gets hung up.  Has a history of esophageal stricture/muscular ring as below with dilation last in 2017.  He had been on pantoprazole 40 mg daily, but tells me that he was not taking it or just using it as needed.  Recently though he did restart it daily.  Is not really having a lot of classic heartburn/indigestion/reflux symptoms, however. Otherwise he feels good.  EGD 08/2015: ENDOSCOPIC IMPRESSION: 1) Muscular ring at GE junction - dilated w/ 54 Fr Maloney - good effect seen. 2) Otherwise normal. Elease Hashimoto used because of stenosis at [facility] suspected at modified barium swallow.  Past Medical History:  Diagnosis Date   Allergy    Arthritis    thumbs, knees, back   Colon polyp    Complication of anesthesia    "hard to wake up after colonoscopy"   GERD (gastroesophageal reflux disease)    H/O measles    as child   H/O mumps    as child   Hemorrhoids    hx of   History of shingles    Hypercholesteremia    diet controlled   Hypertension    Mitral regurgitation    Pneumonia 07/06/2010   hx of   Ringing in ears    Past Surgical History:  Procedure Laterality Date   bilataterl total knee replacement     COLONOSCOPY     x2   ESOPHAGEAL DILATION     HEMORRHOID BANDING     x8    HERNIA REPAIR Bilateral 2012   INGUINAL HERNIA REPAIR Right 05/04/2022   Procedure: RIGHT INGUINAL HERNIA REPAIR WITH MESH;  Surgeon: Griselda Miner, MD;  Location: [facility];  Service: General;  Laterality: Right;   INSERTION OF MESH Right 05/04/2022   Procedure: INSERTION OF MESH;  Surgeon: Griselda Miner, MD;  Location: [facility];  Service: General;   Laterality: Right;   KNEE ARTHROSCOPY Left 23 years ago   NASAL SEPTUM SURGERY  25 years ago   ROTATOR CUFF REPAIR Left 7 years ago   TONSILLECTOMY  2nd grade   TOTAL KNEE ARTHROPLASTY Bilateral 02/14/2014   Procedure: BILATERAL TOTAL KNEE ARTHROPLASTY;  Surgeon: Loanne Drilling, MD;  Location: [facility];  Service: Orthopedics;  Laterality: Bilateral;    reports that he quit smoking about 34 years ago. His smoking use included cigarettes. He started smoking about 44 years ago. He has a 10 pack-year smoking history. He quit smokeless tobacco use about 34 years ago.  His smokeless tobacco use included chew. He reports current alcohol use. He reports that he does not use drugs. family history includes Asthma in his brother; COPD in his father; Liver cancer in his mother; Lung cancer in his paternal grandfather; Prostate cancer in his paternal grandfather. Allergies  Allergen Reactions   Ciprofloxacin Nausea Only      Outpatient Encounter Medications as of 04/28/2023  Medication Sig   Ascorbic Acid (VITAMIN C) 1000 MG tablet Take 1,000 mg by mouth daily.   aspirin 81 MG tablet Take 81 mg by mouth daily.   atorvastatin (LIPITOR) 40 MG tablet Take 40 mg by mouth daily.  atorvastatin (LIPITOR) 40 MG tablet Take 1 tablet (40 mg total) by mouth daily.   atorvastatin (LIPITOR) 40 MG tablet Take 1 tablet (40 mg total) by mouth daily.   Berberine Chloride (BERBERINE HCI) 500 MG CAPS Take by mouth.   Cholecalciferol (VITAMIN D) 125 MCG (5000 UT) CAPS Take 5,000 Units by mouth daily.    Collagen-Vitamin C-Biotin (COLLAGEN 1500/C PO) Take by mouth.   ferrous sulfate 324 MG TBEC Take 324 mg by mouth daily with breakfast.   lisinopril (PRINIVIL,ZESTRIL) 20 MG tablet Take 20 mg by mouth every morning.    lisinopril (ZESTRIL) 20 MG tablet Take 1 tablet (20 mg total) by mouth daily.   lisinopril (ZESTRIL) 20 MG tablet Take 1 tablet (20 mg total) by mouth daily.   Multiple Vitamin (MULTIVITAMIN) capsule Take  1 capsule by mouth daily.   OVER THE COUNTER MEDICATION Take 1 tablet by mouth every other day. CBD   oxyCODONE (ROXICODONE) 5 MG immediate release tablet Take 1 tablet (5 mg total) by mouth every 6 (six) hours as needed for severe pain.   pantoprazole (PROTONIX) 40 MG tablet Take 40 mg by mouth once a week.    Turmeric 500 MG CAPS Take 1 capsule by mouth daily.   valACYclovir (VALTREX) 1000 MG tablet Take 2 tablets (2,000 mg total) by mouth every 12 (twelve) hours as needed  for fever blisters   No facility-administered encounter medications on file as of 04/28/2023.    REVIEW OF SYSTEMS  : All other systems reviewed and negative except where noted in the History of Present Illness.   PHYSICAL EXAM: BP (!) 150/98   Pulse 70   Ht 5' 6.5" (1.689 m)   Wt 150 lb 8 oz (68.3 kg)   BMI 23.93 kg/m  General: Well developed white male in no acute distress Head: Normocephalic and atraumatic Eyes:  Sclerae anicteric, conjunctiva pink. Ears: Normal auditory acuity Lungs: Clear throughout to auscultation; no W/R/R. Heart: Regular rate and rhythm Musculoskeletal: Symmetrical with no gross deformities  Skin: No lesions on visible extremities Extremities: No edema  Neurological: Alert oriented x 4, grossly non-focal Psychological:  Alert and cooperative. Normal mood and affect  ASSESSMENT AND PLAN: *77 year old male with complaints of dysphagia.  Has a history of esophageal stricture/muscular ring at the GE junction that was dilated last in 2017.  Has been having issues with swallowing again for about the past year.  Is now back on his pantoprazole 40 mg daily, but had been off of it or just using as needed for quite some time.  Will schedule for EGD with Dr. Leone Payor.  The risks, benefits, and alternatives were discussed with the patient and he consents to proceed.  CC:  Noberto Retort, MD

## 2023-04-30 ENCOUNTER — Encounter: Payer: Self-pay | Admitting: Internal Medicine

## 2023-05-11 ENCOUNTER — Other Ambulatory Visit (HOSPITAL_BASED_OUTPATIENT_CLINIC_OR_DEPARTMENT_OTHER): Payer: Self-pay

## 2023-05-17 ENCOUNTER — Encounter: Payer: Self-pay | Admitting: Internal Medicine

## 2023-05-17 ENCOUNTER — Ambulatory Visit (AMBULATORY_SURGERY_CENTER): Payer: PPO | Admitting: Internal Medicine

## 2023-05-17 VITALS — BP 136/54 | HR 57 | Temp 97.9°F | Resp 18 | Ht 65.5 in | Wt 151.0 lb

## 2023-05-17 DIAGNOSIS — K222 Esophageal obstruction: Secondary | ICD-10-CM | POA: Diagnosis not present

## 2023-05-17 DIAGNOSIS — Z8719 Personal history of other diseases of the digestive system: Secondary | ICD-10-CM

## 2023-05-17 DIAGNOSIS — K317 Polyp of stomach and duodenum: Secondary | ICD-10-CM | POA: Diagnosis not present

## 2023-05-17 DIAGNOSIS — R1319 Other dysphagia: Secondary | ICD-10-CM

## 2023-05-17 DIAGNOSIS — R1310 Dysphagia, unspecified: Secondary | ICD-10-CM | POA: Diagnosis not present

## 2023-05-17 MED ORDER — SODIUM CHLORIDE 0.9 % IV SOLN: 500.0000 mL | Freq: Once | INTRAVENOUS | Status: DC

## 2023-05-17 MED ORDER — OMEPRAZOLE 20 MG PO CPDR: 20.0000 mg | DELAYED_RELEASE_CAPSULE | Freq: Every day | ORAL | 3 refills | Status: DC

## 2023-05-17 NOTE — Progress Notes (Signed)
Called to room to assist during endoscopic procedure.  Patient ID and intended procedure confirmed with present staff. Received instructions for my participation in the procedure from the performing physician.  

## 2023-05-17 NOTE — Op Note (Signed)
[facility] Patient Name: James Atkinson Procedure Date: 05/17/2023 3:30 PM MRN: [id number] Endoscopist: Iva Boop , MD, [id number] Age: 77 Referring MD:  Date of Birth: 1945/09/24 Gender: Male Account #: [id number] Procedure:                Upper GI endoscopy Indications:              Dysphagia Medicines:                Monitored Anesthesia Care Procedure:                Pre-Anesthesia Assessment:                           - Prior to the procedure, a History and Physical                            was performed, and patient medications and                            allergies were reviewed. The patient's tolerance of                            previous anesthesia was also reviewed. The risks                            and benefits of the procedure and the sedation                            options and risks were discussed with the patient.                            All questions were answered, and informed consent                            was obtained. Prior Anticoagulants: The patient has                            taken no anticoagulant or antiplatelet agents. ASA                            Grade Assessment: II - A patient with mild systemic                            disease. After reviewing the risks and benefits,                            the patient was deemed in satisfactory condition to                            undergo the procedure.                           After obtaining informed consent, the endoscope was  passed under direct vision. Throughout the                            procedure, the patient's blood pressure, pulse, and                            oxygen saturations were monitored continuously. The                            Olympus Scope [id number] was introduced through the                            mouth, and advanced to the second part of duodenum.                            The upper GI endoscopy was accomplished  without                            difficulty. The patient tolerated the procedure                            well. Scope In: Scope Out: Findings:                 One benign-appearing, intrinsic moderate                            (circumferential scarring or stenosis; an endoscope                            may pass) stenosis was found at the                            gastroesophageal junction. There was a singel                            erosion there also. The stenosis was traversed. A                            TTS dilator was passed through the scope. Dilation                            with an 18-19-20 mm balloon dilator was performed                            to 20 mm. The dilation site was examined and showed                            mild mucosal disruption. Estimated blood loss was                            minimal.                           The gastroesophageal flap valve was visualized  endoscopically and classified as Hill Grade II                            (fold present, opens with respiration).                           A few diminutive sessile polyps were found in the                            gastric body.                           The exam was otherwise without abnormality.                           The cardia and gastric fundus were normal on                            retroflexion. Complications:            No immediate complications. Estimated Blood Loss:     Estimated blood loss was minimal. Impression:               - Benign-appearing esophageal stenosis and a single                            reflux erosion. Dilated. 20 mm.                           - Gastroesophageal flap valve classified as Hill                            Grade II (fold present, opens with respiration).                           - A few gastric polyps. Classic appearance of                            benign and innocent fundic gland polyps                            - The examination was otherwise normal.                           - No specimens collected. Recommendation:           - Patient has a contact number available for                            emergencies. The signs and symptoms of potential                            delayed complications were discussed with the                            patient. Return to normal activities tomorrow.  Written discharge instructions were provided to the                            patient.                           - Clear liquids x 1 hour then soft foods rest of                            day. Start prior diet tomorrow.                           - GERD diet                           Start omeprazole 20 mg qd - had been on PPI in past                            but stopped - he said heartburn not so much of a                            problem but I suspect this is GERD-mediated and                            recommend PPI to reduce recurrence Iva Boop, MD 05/17/2023 3:55:20 PM This report has been signed electronically.

## 2023-05-17 NOTE — Patient Instructions (Addendum)
I found a narrow area also called a stricture where the esophagus and stomach meet. The food is stopping here. I dilated it so that gets better. I have also prescribed omeprazole to reduce acid and stop this from happening again. I recommend you stay on that - even though you are not having heartburn I think acid is causing this problem.  If you don't get better call us back.  I appreciate the opportunity to care for you. Iva Boop, MD, FACG   YOU HAD AN ENDOSCOPIC PROCEDURE TODAY AT THE [facility]:   Refer to the procedure report that was given to you for any specific questions about what was found during the examination.  If the procedure report does not answer your questions, please call your gastroenterologist to clarify.  If you requested that your care partner not be given the details of your procedure findings, then the procedure report has been included in a sealed envelope for you to review at your convenience later.  YOU SHOULD EXPECT: Some feelings of bloating in the abdomen. Passage of more gas than usual.  Walking can help get rid of the air that was put into your GI tract during the procedure and reduce the bloating. If you had a lower endoscopy (such as a colonoscopy or flexible sigmoidoscopy) you may notice spotting of blood in your stool or on the toilet paper. If you underwent a bowel prep for your procedure, you may not have a normal bowel movement for a few days.  Please Note:  You might notice some irritation and congestion in your nose or some drainage.  This is from the oxygen used during your procedure.  There is no need for concern and it should clear up in a day or so.  SYMPTOMS TO REPORT IMMEDIATELY:  Following upper endoscopy (EGD)  Vomiting of blood or coffee ground material  New chest pain or pain under the shoulder blades  Painful or persistently difficult swallowing  New shortness of breath  Fever of 100F or higher  Black, tarry-looking  stools  For urgent or emergent issues, a gastroenterologist can be reached at any hour by calling [phone]. Do not use MyChart messaging for urgent concerns.    DIET:  We do recommend a small meal at first, but then you may proceed to your regular diet.  Drink plenty of fluids but you should avoid alcoholic beverages for 24 hours.  ACTIVITY:  You should plan to take it easy for the rest of today and you should NOT DRIVE or use heavy machinery until tomorrow (because of the sedation medicines used during the test).    FOLLOW UP: Our staff will call the number listed on your records the next business day following your procedure.  We will call around 7:15- 8:00 am to check on you and address any questions or concerns that you may have regarding the information given to you following your procedure. If we do not reach you, we will leave a message.     If any biopsies were taken you will be contacted by phone or by letter within the next 1-3 weeks.  Please call us at [phone] if you have not heard about the biopsies in 3 weeks.    SIGNATURES/CONFIDENTIALITY: You and/or your care partner have signed paperwork which will be entered into your electronic medical record.  These signatures attest to the fact that that the information above on your After Visit Summary has been reviewed and is  understood.  Full responsibility of the confidentiality of this discharge information lies with you and/or your care-partner.

## 2023-05-17 NOTE — Progress Notes (Signed)
History and Physical Interval Note:  05/17/2023 3:32 PM  James Atkinson  has presented today for endoscopic procedure(s), with the diagnosis of  Encounter Diagnoses  Name Primary?   Esophageal dysphagia Yes   History of esophageal stricture   .  The various methods of evaluation and treatment have been discussed with the patient and/or family. After consideration of risks, benefits and other options for treatment, the patient has consented to  the endoscopic procedure(s).   The patient's history has been reviewed, patient examined, no change in status, stable for endoscopic procedure(s).  I have reviewed the patient's chart and labs.  Questions were answered to the patient's satisfaction.     Iva Boop, MD, [facility]

## 2023-05-17 NOTE — Progress Notes (Signed)
Vitals-JF  Pt's states no medical or surgical changes since previsit or office visit.  

## 2023-05-18 ENCOUNTER — Telehealth: Payer: Self-pay

## 2023-05-18 NOTE — Telephone Encounter (Signed)
Post procedure follow up call, no answer 

## 2023-05-26 DIAGNOSIS — H25812 Combined forms of age-related cataract, left eye: Secondary | ICD-10-CM | POA: Diagnosis not present

## 2023-05-26 DIAGNOSIS — H2512 Age-related nuclear cataract, left eye: Secondary | ICD-10-CM | POA: Diagnosis not present

## 2023-06-15 DIAGNOSIS — J029 Acute pharyngitis, unspecified: Secondary | ICD-10-CM | POA: Diagnosis not present

## 2023-08-03 ENCOUNTER — Ambulatory Visit: Payer: PPO | Attending: Cardiology | Admitting: Cardiology

## 2023-08-03 ENCOUNTER — Encounter: Payer: Self-pay | Admitting: Cardiology

## 2023-08-03 VITALS — BP 142/70 | HR 53 | Ht 66.5 in | Wt 156.0 lb

## 2023-08-03 DIAGNOSIS — I340 Nonrheumatic mitral (valve) insufficiency: Secondary | ICD-10-CM | POA: Diagnosis not present

## 2023-08-03 DIAGNOSIS — R0609 Other forms of dyspnea: Secondary | ICD-10-CM

## 2023-08-03 DIAGNOSIS — I10 Essential (primary) hypertension: Secondary | ICD-10-CM

## 2023-08-03 DIAGNOSIS — R001 Bradycardia, unspecified: Secondary | ICD-10-CM

## 2023-08-03 NOTE — Patient Instructions (Addendum)
Medication Instructions:  Your physician recommends that you continue on your current medications as directed. Please refer to the Current Medication list given to you today.  *If you need a refill on your cardiac medications before your next appointment, please call your pharmacy*   Lab Work: None Ordered If you have labs (blood work) drawn today and your tests are completely normal, you will receive your results only by: MyChart Message (if you have MyChart) OR A paper copy in the mail If you have any lab test that is abnormal or we need to change your treatment, we will call you to review the results.   Testing/Procedures: Your physician has requested that you have an echocardiogram. Echocardiography is a painless test that uses sound waves to create images of your heart. It provides your doctor with information about the size and shape of your heart and how well your heart's chambers and valves are working. This procedure takes approximately one hour. There are no restrictions for this procedure. Please do NOT wear cologne, perfume, aftershave, or lotions (deodorant is allowed). Please arrive 15 minutes prior to your appointment time.  Please note: We ask at that you not bring children with you during ultrasound (echo/ vascular) testing. Due to room size and safety concerns, children are not allowed in the ultrasound rooms during exams. Our front office staff cannot provide observation of children in our lobby area while testing is being conducted. An adult accompanying a patient to their appointment will only be allowed in the ultrasound room at the discretion of the ultrasound technician under special circumstances. We apologize for any inconvenience.    Follow-Up: At [facility], you and your health needs are our priority.  As part of our continuing mission to provide you with exceptional heart care, we have created designated Provider Care Teams.  These Care Teams include your  primary Cardiologist (physician) and Advanced Practice Providers (APPs -  Physician Assistants and Nurse Practitioners) who all work together to provide you with the care you need, when you need it.  We recommend signing up for the patient portal called "MyChart".  Sign up information is provided on this After Visit Summary.  MyChart is used to connect with patients for Virtual Visits (Telemedicine).  Patients are able to view lab/test results, encounter notes, upcoming appointments, etc.  Non-urgent messages can be sent to your provider as well.   To learn more about what you can do with MyChart, go to [email or web address].    Your next appointment:   12 month(s)  The format for your next appointment:   In Person  Provider:   Gypsy Balsam, MD    Other Instructions NA

## 2023-08-03 NOTE — Progress Notes (Signed)
Cardiology Office Note:    Date:  08/03/2023   ID:  James Atkinson, DOB [birth year], MRN [id number]  PCP:  James Retort, MD  Cardiologist:  Gypsy Balsam, MD    Referring MD: James Retort, MD   Chief Complaint  Patient presents with   Follow-up    History of Present Illness:    James Atkinson is a 78 y.o. male past medical history significant for essential hypertension, mild to moderate mitral regurgitation, dyslipidemia, sinus bradycardia.  Comes today to my office after not being seen for almost 2 years.  Denies have any chest pain tightness squeezing pressure burning chest still works as an Personnel officer and he is enjoying it and did not notice any decrease ability to exercise.  There is no dizziness no passing out  Past Medical History:  Diagnosis Date   Allergy    Arthritis    thumbs, knees, back   Colon polyp    Complication of anesthesia    "hard to wake up after colonoscopy"   GERD (gastroesophageal reflux disease)    H/O measles    as child   H/O mumps    as child   Hemorrhoids    hx of   History of shingles    Hypercholesteremia    diet controlled   Hypertension    Mitral regurgitation    Pneumonia 07/06/2010   hx of   Ringing in ears     Past Surgical History:  Procedure Laterality Date   bilataterl total knee replacement     COLONOSCOPY     x2   ESOPHAGEAL DILATION     HEMORRHOID BANDING     x8    HERNIA REPAIR Bilateral 2012   INGUINAL HERNIA REPAIR Right 05/04/2022   Procedure: RIGHT INGUINAL HERNIA REPAIR WITH MESH;  Surgeon: Griselda Miner, MD;  Location: [facility];  Service: General;  Laterality: Right;   INSERTION OF MESH Right 05/04/2022   Procedure: INSERTION OF MESH;  Surgeon: Griselda Miner, MD;  Location: [facility];  Service: General;  Laterality: Right;   KNEE ARTHROSCOPY Left 23 years ago   NASAL SEPTUM SURGERY  25 years ago   ROTATOR CUFF REPAIR Left 7 years ago   TONSILLECTOMY  2nd grade    TOTAL KNEE ARTHROPLASTY Bilateral 02/14/2014   Procedure: BILATERAL TOTAL KNEE ARTHROPLASTY;  Surgeon: Loanne Drilling, MD;  Location: [facility];  Service: Orthopedics;  Laterality: Bilateral;    Current Medications: Current Meds  Medication Sig   Ascorbic Acid (VITAMIN C) 1000 MG tablet Take 1,000 mg by mouth daily.   aspirin 81 MG tablet Take 81 mg by mouth daily.   atorvastatin (LIPITOR) 40 MG tablet Take 1 tablet (40 mg total) by mouth daily.   Berberine Chloride (BERBERINE HCI) 500 MG CAPS Take 1 capsule by mouth daily.   Cholecalciferol (VITAMIN D) 125 MCG (5000 UT) CAPS Take 5,000 Units by mouth daily.    Collagen-Vitamin C-Biotin (COLLAGEN 1500/C PO) Take 1 tablet by mouth daily.   ferrous sulfate 324 MG TBEC Take 324 mg by mouth daily with breakfast.   lisinopril (ZESTRIL) 20 MG tablet Take 1 tablet (20 mg total) by mouth daily.   Multiple Vitamin (MULTIVITAMIN) capsule Take 1 capsule by mouth daily.   omeprazole (PRILOSEC) 20 MG capsule Take 1 capsule (20 mg total) by mouth daily.   OVER THE COUNTER MEDICATION Take 1 tablet by mouth every other day. CBD   Turmeric 500 MG  CAPS Take 1 capsule by mouth daily.   valACYclovir (VALTREX) 1000 MG tablet Take 2 tablets (2,000 mg total) by mouth every 12 (twelve) hours as needed  for fever blisters (Patient taking differently: Take 2,000 mg by mouth every 12 (twelve) hours as needed (Out breaks).)     Allergies:   Ciprofloxacin   Social History   Socioeconomic History   Marital status: Married    Spouse name: Not on file   Number of children: 2   Years of education: Not on file   Highest education level: Not on file  Occupational History   Not on file  Tobacco Use   Smoking status: Former    Current packs/day: 0.00    Average packs/day: 1 pack/day for 10.0 years (10.0 ttl pk-yrs)    Types: Cigarettes    Start date: 07/06/1978    Quit date: 07/06/1988    Years since quitting: 35.0   Smokeless tobacco: Former    Types: Chew     Quit date: 07/06/1988  Vaping Use   Vaping status: Never Used  Substance and Sexual Activity   Alcohol use: Yes    Comment: occ   Drug use: No   Sexual activity: Yes    Birth control/protection: None  Other Topics Concern   Not on file  Social History Narrative   Married, 2 adult children, 2 grandchildren   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 There is no history of Colon cancer, Esophageal cancer, Rectal cancer, or Stomach cancer. ROS:   Please see the history of present illness.    All 14 point review of systems negative except as described per history of present illness  EKGs/Labs/Other Studies Reviewed:    EKG Interpretation Date/Time:  Tuesday August 03 2023 13:16:46 EST Ventricular Rate:  52 PR Interval:  216 QRS Duration:  88 QT Interval:  400 QTC Calculation: 372 R Axis:   -37  Text Interpretation: Sinus bradycardia with 1st degree A-V block Left axis deviation Anterior infarct (cited on or before 11-Aug-2004) When compared with ECG of 05-Feb-2014 10:29, QRS axis Shifted left Confirmed by Gypsy Balsam [phone]) on 08/03/2023 1:19:29 PM    Recent Labs: No results found for requested labs within last 365 days.  Recent Lipid Panel No results found for: "CHOL", "TRIG", "HDL", "CHOLHDL", "VLDL", "LDLCALC", "LDLDIRECT"  Physical Exam:    VS:  BP (!) 142/70 (BP Location: Right Arm, Patient Position: Sitting)   Pulse (!) 53   Ht 5' 6.5" (1.689 m)   Wt 156 lb (70.8 kg)   SpO2 96%   BMI 24.80 kg/m     Wt Readings from Last 3  Encounters:  08/03/23 156 lb (70.8 kg)  05/17/23 151 lb (68.5 kg)  04/28/23 150 lb 8 oz (68.3 kg)     GEN:  Well nourished, well developed in no acute distress HEENT: Normal NECK: No JVD; No carotid bruits LYMPHATICS: No lymphadenopathy CARDIAC: RRR, no murmurs, no rubs, no gallops RESPIRATORY:  Clear to auscultation without rales, wheezing or rhonchi  ABDOMEN: Soft, non-tender,  non-distended MUSCULOSKELETAL:  No edema; No deformity  SKIN: Warm and dry LOWER EXTREMITIES: no swelling NEUROLOGIC:  Alert and oriented x 3 PSYCHIATRIC:  Normal affect   ASSESSMENT:    1. Primary hypertension   2. Moderate mitral regurgitation   3. Bradycardia    PLAN:    In order of problems listed above:  Moderate mitral valve regurgitation.  I will schedule him to have echocardiogram to look at the degree of the lesion.  Likely she does not have any signs or symptoms of decompensation. Essential hypertension blood pressure slightly elevated in the office however she checked it at home and will follow-up. Bradycardia denies have any symptoms suggestive probably due to her monitor 1-1/2 years ago did not show any significant bradycardia ask him to let me know if he develop dizziness passing out or syncope or decrease ability to exercise. Dyslipidemia I did review K PN which show me LDL 63 HDL 57 we will continue present management   Medication Adjustments/Labs and Tests Ordered: Current medicines are reviewed at length with the patient today.  Concerns regarding medicines are outlined above.  Orders Placed This Encounter  Procedures   EKG 12-Lead   Medication changes: No orders of the defined types were placed in this encounter.   Signed, Georgeanna Lea, MD, [facility] 08/03/2023 1:30 PM    [facility]

## 2023-08-03 NOTE — Addendum Note (Signed)
Addended by: Baldo Ash D on: 08/03/2023 01:36 PM   Modules accepted: Orders

## 2023-08-20 ENCOUNTER — Ambulatory Visit (HOSPITAL_BASED_OUTPATIENT_CLINIC_OR_DEPARTMENT_OTHER)
Admission: RE | Admit: 2023-08-20 | Discharge: 2023-08-20 | Disposition: A | Discharge status: Home / Self Care | Payer: PPO | Source: Ambulatory Visit | Attending: Cardiology | Admitting: Cardiology

## 2023-08-20 DIAGNOSIS — R0609 Other forms of dyspnea: Secondary | ICD-10-CM | POA: Diagnosis present

## 2023-08-20 LAB — ECHOCARDIOGRAM COMPLETE
AR max vel: 1.68 cm2
AV Area VTI: 1.91 cm2
AV Area mean vel: 1.92 cm2
AV Mean grad: 5 mm[Hg]
AV Peak grad: 10.9 mm[Hg]
AV Vena cont: 0.2 cm
Ao pk vel: 1.65 m/s
Area-P 1/2: 3.93 cm2
Calc EF: 61 %
MV M vel: 5.64 m/s
MV Peak grad: 127 mm[Hg]
MV Vena cont: 0.15 cm
P 1/2 time: 2020 ms
Radius: 0.3 cm
S' Lateral: 2.4 cm
Single Plane A2C EF: 62.5 %
Single Plane A4C EF: 59.2 %

## 2023-09-07 ENCOUNTER — Telehealth: Payer: Self-pay | Admitting: Cardiology

## 2023-09-07 NOTE — Telephone Encounter (Signed)
 Patient notified of results and verbalized understanding.

## 2023-09-07 NOTE — Telephone Encounter (Signed)
 Patient is returning call to discuss echo results.

## 2023-09-09 ENCOUNTER — Telehealth: Payer: Self-pay

## 2023-09-09 NOTE — Telephone Encounter (Signed)
-----   Message from Gypsy Balsam sent at 08/23/2023  8:39 AM EST ----- Echocardiogram showed preserved left ventricle ejection fraction, mild to moderate mitral valve regurgitation, overall looks good

## 2023-09-09 NOTE — Telephone Encounter (Signed)
 LM and Mailed a letter requesting a call back

## 2023-10-01 DIAGNOSIS — R0989 Other specified symptoms and signs involving the circulatory and respiratory systems: Secondary | ICD-10-CM | POA: Diagnosis not present

## 2023-12-24 ENCOUNTER — Other Ambulatory Visit (HOSPITAL_BASED_OUTPATIENT_CLINIC_OR_DEPARTMENT_OTHER): Payer: Self-pay

## 2024-01-19 DIAGNOSIS — D649 Anemia, unspecified: Secondary | ICD-10-CM | POA: Diagnosis not present

## 2024-01-19 DIAGNOSIS — R413 Other amnesia: Secondary | ICD-10-CM | POA: Diagnosis not present

## 2024-01-19 DIAGNOSIS — E7800 Pure hypercholesterolemia, unspecified: Secondary | ICD-10-CM | POA: Diagnosis not present

## 2024-01-19 DIAGNOSIS — R7303 Prediabetes: Secondary | ICD-10-CM | POA: Diagnosis not present

## 2024-01-19 DIAGNOSIS — K219 Gastro-esophageal reflux disease without esophagitis: Secondary | ICD-10-CM | POA: Diagnosis not present

## 2024-01-19 DIAGNOSIS — I10 Essential (primary) hypertension: Secondary | ICD-10-CM | POA: Diagnosis not present

## 2024-01-19 DIAGNOSIS — Z8739 Personal history of other diseases of the musculoskeletal system and connective tissue: Secondary | ICD-10-CM | POA: Diagnosis not present

## 2024-03-13 DIAGNOSIS — H26491 Other secondary cataract, right eye: Secondary | ICD-10-CM | POA: Diagnosis not present

## 2024-03-13 DIAGNOSIS — H40023 Open angle with borderline findings, high risk, bilateral: Secondary | ICD-10-CM | POA: Diagnosis not present

## 2024-04-11 ENCOUNTER — Ambulatory Visit: Admitting: Internal Medicine

## 2024-04-11 ENCOUNTER — Encounter: Payer: Self-pay | Admitting: Internal Medicine

## 2024-04-11 VITALS — BP 120/62 | HR 58 | Ht 66.5 in | Wt 151.8 lb

## 2024-04-11 DIAGNOSIS — R053 Chronic cough: Secondary | ICD-10-CM

## 2024-04-11 DIAGNOSIS — R490 Dysphonia: Secondary | ICD-10-CM

## 2024-04-11 DIAGNOSIS — K219 Gastro-esophageal reflux disease without esophagitis: Secondary | ICD-10-CM | POA: Diagnosis not present

## 2024-04-11 DIAGNOSIS — R0989 Other specified symptoms and signs involving the circulatory and respiratory systems: Secondary | ICD-10-CM

## 2024-04-11 DIAGNOSIS — Z8719 Personal history of other diseases of the digestive system: Secondary | ICD-10-CM | POA: Diagnosis not present

## 2024-04-11 NOTE — Patient Instructions (Signed)
 We have placed a referral to ENT. They will contact you about an appointment.    _______________________________________________________  If your blood pressure at your visit was 140/90 or greater, please contact your primary care physician to follow up on this.  _______________________________________________________  If you are age 77 or older, your body mass index should be between 23-30. Your Body mass index is 24.13 kg/m. If this is out of the aforementioned range listed, please consider follow up with your Primary Care Provider.  If you are age 61 or younger, your body mass index should be between 19-25. Your Body mass index is 24.13 kg/m. If this is out of the aformentioned range listed, please consider follow up with your Primary Care Provider.   ________________________________________________________  The [facility] providers would like to encourage you to use MYCHART to communicate with providers for non-urgent requests or questions.  Due to long hold times on the telephone, sending your provider a message by [facility] may be a faster and more efficient way to get a response.  Please allow 48 business hours for a response.  Please remember that this is for non-urgent requests.  _______________________________________________________  [facility] is using a team-based approach to care.  Your team is made up of your doctor and two to three APPS. Our APPS (Nurse Practitioners and Physician Assistants) work with your physician to ensure care continuity for you. They are fully qualified to address your health concerns and develop a treatment plan. They communicate directly with your gastroenterologist to care for you. Seeing the Advanced Practice Practitioners on your physician's team can help you by facilitating care more promptly, often allowing for earlier appointments, access to diagnostic testing, procedures, and other specialty referrals.   I appreciate the opportunity to  care for you. Lupita Commander, MD, [facility]

## 2024-04-11 NOTE — Progress Notes (Signed)
 James Atkinson 78 y.o. 01-04-1946 [id number]  Assessment & Plan:   Encounter Diagnoses  Name Primary?   Chronic cough Yes   Hoarseness    Throat clearing    GERD with stricture       Chronic hoarseness, throat clearing, and cough Symptoms persistent for a year, unresponsive to antibiotics and allergy medications. Differential includes allergies and reflux. - Refer to ENT specialist for evaluation.  Brown coating on tongue likely from coffee or other food ingestion.  He does not use tobacco products I do not think it is pathologic.   GERD w/ stricture status post dilation 2017 and 2024 No current dysphagia. Managed with omeprazole  20 mg daily. No heartburn.  Laryngeal pharyngeal reflux not ruled out but his symptoms do not sound like that to me though I realize it is in the realm of possibility.  Await ENT evaluation.  No role for repeat gastrointestinal endoscopy.  CC: Arloa Elsie SAUNDERS, MD   Subjective:   Chief Complaint: Cough, throat clearing and hoarseness  HPI Discussed the use of AI scribe software for clinical note transcription with the patient, who gave verbal consent to proceed.   James Atkinson is a 78 year old male with acid reflux and esophageal stricture who presents with hoarseness, throat clearing, and chronic cough.  He has experienced hoarseness, throat clearing, and a chronic cough for the past year. There is a sensation of something gathering in his throat, prompting frequent coughing, especially at night. Hoarseness occurs once or twice a month, lasting a few days each time. His throat becomes dry and sore, but there is no nasal congestion or postnasal drip. Previous antibiotic treatment for pharyngitis December 2024 resolved a severe sore throat, but current symptoms persist.  He reports taking omeprazole  and denies current heartburn or swallowing difficulties. He underwent esophageal dilation procedures in 2017 and last year for previous swallowing  issues. Currently, there are no difficulties swallowing food or liquids.  He has tried allergy medications, including Claritin, for over a month without relief. A history of deviated septum repair resolved previous nasal breathing issues. He denies recent use of nasal sprays like Flonase . He has a history of smoking but quit approximately 40 years ago.      EGD /dili 05/17/23 - Benign-appearing esophageal stenosis and a single reflux erosion. Dilated. 20 mm. - Gastroesophageal flap valve classified as Hill Grade II (fold present, opens with respiration). - A few gastric polyps. Classic appearance of benign and innocent fundic gland polyps - The examination was otherwise normal. - No specimens collected. Impression: - Patient has a contact number available for emergencies. The signs and symptoms of potential delayed complications were discussed with the patient. Return to normal activities tomorrow. Written discharge instructions were provided to the patient. - Clear liquids x 1 hour then soft foods rest of day. Start prior diet tomorrow. - GERD diet Start omeprazole  20 mg qd - had been on PPI in past but stopped - he said heartburn not so much of a problem but I suspect this is GERD-mediated and recommend PPI to reduce recurrence  Allergies  Allergen Reactions   Ciprofloxacin Nausea Only   Current Meds  Medication Sig   Ascorbic Acid (VITAMIN C) 1000 MG tablet Take 1,000 mg by mouth daily.   aspirin 81 MG tablet Take 81 mg by mouth daily.   atorvastatin  (LIPITOR) 40 MG tablet Take 1 tablet (40 mg total) by mouth daily.   Berberine Chloride (BERBERINE HCI) 500 MG CAPS  Take 1 capsule by mouth daily.   Cholecalciferol (VITAMIN D) 125 MCG (5000 UT) CAPS Take 5,000 Units by mouth daily.    Collagen-Vitamin C-Biotin (COLLAGEN 1500/C PO) Take 1 tablet by mouth daily.   ferrous sulfate 324 MG TBEC Take 324 mg by mouth daily with breakfast.   lisinopril  (ZESTRIL ) 20 MG tablet Take 1 tablet (20 mg total)  by mouth daily.   Multiple Vitamin (MULTIVITAMIN) capsule Take 1 capsule by mouth daily.   omeprazole  (PRILOSEC) 20 MG capsule Take 1 capsule (20 mg total) by mouth daily.   OVER THE COUNTER MEDICATION Take 1 tablet by mouth every other day. CBD   Turmeric 500 MG CAPS Take 1 capsule by mouth daily.   valACYclovir  (VALTREX ) 1000 MG tablet Take 2 tablets (2,000 mg total) by mouth every 12 (twelve) hours as needed  for fever blisters (Patient taking differently: Take 2,000 mg by mouth every 12 (twelve) hours as needed (Out breaks).)   Past Medical History:  Diagnosis Date   Allergy    Arthritis    thumbs, knees, back   Colon polyp    Complication of anesthesia    hard to wake up after colonoscopy   GERD (gastroesophageal reflux disease)    H/O measles    as child   H/O mumps    as child   Hemorrhoids    hx of   History of shingles    Hypercholesteremia    diet controlled   Hypertension    Mitral regurgitation    Pneumonia 07/06/2010   hx of   Ringing in ears    Past Surgical History:  Procedure Laterality Date   bilataterl total knee replacement     COLONOSCOPY     x2   ESOPHAGEAL DILATION     HEMORRHOID BANDING     x8    HERNIA REPAIR Bilateral 2012   INGUINAL HERNIA REPAIR Right 05/04/2022   Procedure: RIGHT INGUINAL HERNIA REPAIR WITH MESH;  Surgeon: Curvin Deward MOULD, MD;  Location: [facility];  Service: General;  Laterality: Right;   INSERTION OF MESH Right 05/04/2022   Procedure: INSERTION OF MESH;  Surgeon: Curvin Deward MOULD, MD;  Location: [facility];  Service: General;  Laterality: Right;   KNEE ARTHROSCOPY Left 23 years ago   NASAL SEPTUM SURGERY  25 years ago   ROTATOR CUFF REPAIR Left 7 years ago   TONSILLECTOMY  2nd grade   TOTAL KNEE ARTHROPLASTY Bilateral 02/14/2014   Procedure: BILATERAL TOTAL KNEE ARTHROPLASTY;  Surgeon: Dempsey Melodi GAILS, MD;  Location: [facility];  Service: Orthopedics;  Laterality: Bilateral;   Social History    Social History Narrative   Married, 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   Review of Systems As above  Objective:   Physical Exam BP 120/62   Pulse (!) 58   Ht 5' 6.5 (1.689 m)   Wt 151 lb 12.8 oz (68.9 kg)   BMI 24.13 kg/m  Mouth - brown coat on tongue, sl post pharyngeal erythema  Neck supple no TM/mass or cervical/[place] lymphadenopathy     Data reviewed: See HPI I did review primary care notes from 06/25/2023 and 2025 and labs in the care everywhere section of EMR as well.  Hemoglobin is normal January 2025.  He had a normal ejection fraction on echocardiogram in the last year also.

## 2024-05-04 ENCOUNTER — Ambulatory Visit (INDEPENDENT_AMBULATORY_CARE_PROVIDER_SITE_OTHER)

## 2024-05-04 ENCOUNTER — Encounter (INDEPENDENT_AMBULATORY_CARE_PROVIDER_SITE_OTHER): Payer: Self-pay

## 2024-05-04 VITALS — BP 130/71 | HR 61

## 2024-05-04 DIAGNOSIS — R09A2 Foreign body sensation, throat: Secondary | ICD-10-CM | POA: Diagnosis not present

## 2024-05-04 DIAGNOSIS — R438 Other disturbances of smell and taste: Secondary | ICD-10-CM | POA: Diagnosis not present

## 2024-05-04 DIAGNOSIS — H7391 Unspecified disorder of tympanic membrane, right ear: Secondary | ICD-10-CM | POA: Diagnosis not present

## 2024-05-04 DIAGNOSIS — R0989 Other specified symptoms and signs involving the circulatory and respiratory systems: Secondary | ICD-10-CM | POA: Diagnosis not present

## 2024-05-04 DIAGNOSIS — J343 Hypertrophy of nasal turbinates: Secondary | ICD-10-CM | POA: Diagnosis not present

## 2024-05-04 DIAGNOSIS — J3489 Other specified disorders of nose and nasal sinuses: Secondary | ICD-10-CM

## 2024-05-04 MED ORDER — FLUTICASONE PROPIONATE 50 MCG/ACT NA SUSP: 2.0000 | Freq: Every day | NASAL | 2 refills | Status: AC

## 2024-05-04 NOTE — Progress Notes (Signed)
 Dear Dr. Avram, Here is my assessment for our mutual patient, James Atkinson. Thank you for allowing me the opportunity to care for your patient. Please do not hesitate to contact me should you have any other questions. Sincerely, Dr. Hadassah Parody  Otolaryngology Clinic Note Referring provider: Dr. Avram HPI:   Initial HPI (05/04/2024) James Atkinson is a 78 year old male who presents with chronic throat clearing and loss of taste.  Chronic throat clearing and pharyngeal discomfort - Chronic throat clearing for over one year feels like worsening  - Sensation of needing to clear white mucus, not a productive cough - Occurs throughout the day and night  Dysgeusia (altered taste) - Diminished sense of taste - Foods, such as sweet cornbread, no longer taste as sweet - Sense of smell is not strong but able to smell most things.  He does have a history of septoplasty performed 15 to 18 years ago currently does not feel like he has significant nasal congestion but he does have congestion alternates sides.  Does not use nasal sprays  Otologic history - No current ear pain - Has been told in the past he has scar tissue in ear from childhood incidents - No history of ear surgeries - History of past ear infection treated successfully   Independent Review of Additional Tests or Records:  Referral note James Avram, MD (04/11/24): chronic hoarseness, throat clearing, cough. GERD with stricture s/p dilation 2017 and 2024   Upper endoscopy note (carl gessner 05/17/23) reviewed: esophageal stricture dilated to 20   PMH/Meds/All/SocHx/FamHx/ROS:   Past Medical History:  Diagnosis Date   Allergy    Arthritis    thumbs, knees, back   Colon polyp    Complication of anesthesia    hard to wake up after colonoscopy   GERD (gastroesophageal reflux disease)    H/O measles    as child   H/O mumps    as child   Hemorrhoids    hx of   History of shingles    Hypercholesteremia    diet  controlled   Hypertension    Mitral regurgitation    Pneumonia 07/06/2010   hx of   Ringing in ears      Past Surgical History:  Procedure Laterality Date   bilataterl total knee replacement     COLONOSCOPY     x2   ESOPHAGEAL DILATION     HEMORRHOID BANDING     x8    HERNIA REPAIR Bilateral 2012   INGUINAL HERNIA REPAIR Right 05/04/2022   Procedure: RIGHT INGUINAL HERNIA REPAIR WITH MESH;  Surgeon: Curvin Deward MOULD, MD;  Location: [facility];  Service: General;  Laterality: Right;   INSERTION OF MESH Right 05/04/2022   Procedure: INSERTION OF MESH;  Surgeon: Curvin Deward MOULD, MD;  Location: [facility];  Service: General;  Laterality: Right;   KNEE ARTHROSCOPY Left 23 years ago   NASAL SEPTUM SURGERY  25 years ago   ROTATOR CUFF REPAIR Left 7 years ago   TONSILLECTOMY  2nd grade   TOTAL KNEE ARTHROPLASTY Bilateral 02/14/2014   Procedure: BILATERAL TOTAL KNEE ARTHROPLASTY;  Surgeon: Dempsey Melodi GAILS, MD;  Location: [facility];  Service: Orthopedics;  Laterality: Bilateral;    Family History  Problem Relation Age of Onset   COPD Father    Liver cancer Mother    Lung cancer Paternal Grandfather    Prostate cancer Paternal Grandfather    Asthma Brother    Colon cancer Neg  Hx    Esophageal cancer Neg Hx    Rectal cancer Neg Hx    Stomach cancer Neg Hx      Social Connections: Not on file     Current Outpatient Medications  Medication Instructions   aspirin 81 mg, Daily   atorvastatin  (LIPITOR) 40 mg, Oral, Daily   Berberine Chloride (BERBERINE HCI) 500 MG CAPS 1 capsule, Daily   Collagen-Vitamin C-Biotin (COLLAGEN 1500/C PO) 1 tablet, Daily   ferrous sulfate 324 mg, Daily with breakfast   fluticasone  (FLONASE ) 50 MCG/ACT nasal spray 2 sprays, Each Nare, Daily   lisinopril  (ZESTRIL ) 20 mg, Oral, Daily   Multiple Vitamin (MULTIVITAMIN) capsule 1 capsule, Daily   omeprazole  (PRILOSEC) 20 mg, Oral, Daily   OVER THE COUNTER MEDICATION 1 tablet, Every  other day   Turmeric 500 MG CAPS 1 capsule, Daily   valACYclovir  (VALTREX ) 1000 MG tablet Take 2 tablets (2,000 mg total) by mouth every 12 (twelve) hours as needed  for fever blisters   vitamin C 1,000 mg, Daily   Vitamin D 5,000 Units, Daily     Physical Exam:   BP 130/71 (BP Location: Left Arm, Patient Position: Sitting)   Pulse 61   SpO2 94%   Salient findings:  CN II-XII intact Bilateral EAC clear and TM intact with well pneumatized middle ear spaces.  Right tympanic membrane noted to have reddish hue anteriorly and so exam with a microscope was warranted to further assess this with findings as below in physical exam section and in procedures  Anterior rhinoscopy: Septum midline; bilateral inferior turbinates with hypertrophy No lesions of oral cavity/oropharynx No obviously palpable neck masses/lymphadenopathy/thyromegaly No respiratory distress or stridor TFL was indicated to better evaluate the proximal airway, given the patient's history and exam findings, and is detailed below.  Seprately Identifiable Procedures:  Prior to initiating any procedures, risks/benefits/alternatives were explained to the patient and verbal consent obtained.  Procedure Note (05/04/2024) Pre-procedure diagnosis: Chronic throat clearing, globus sensation Post-procedure diagnosis: Same Procedure: Transnasal Fiberoptic Laryngoscopy, CPT 31575 - Mod 25 Indication: Chronic throat clearing, globus sensation Complications: None apparent EBL: 0 mL  The procedure was undertaken to further evaluate the patient's complaint of chronic throat clearing and globus sensation, with mirror exam inadequate for appropriate examination due to gag reflex and poor patient tolerance  Procedure:  Patient was identified as correct patient. Verbal consent was obtained. The nose was sprayed with oxymetazoline and 4% lidocaine . The The flexible laryngoscope was passed through the nose to view the nasal cavity, pharynx  (oropharynx, hypopharynx) and larynx.  The larynx was examined at rest and during multiple phonatory tasks. Documentation was obtained and reviewed with patient. The scope was removed. The patient tolerated the procedure well.  Findings: The nasal cavity and nasopharynx did not reveal any masses or lesions, mucosa appeared to be without obvious lesions. The tongue base, pharyngeal walls, piriform sinuses, vallecula, epiglottis and postcricoid region are normal in appearance. The visualized portion of the subglottis and proximal trachea is widely patent. The vocal folds are mobile bilaterally. There are no lesions on the free edge of the vocal folds nor elsewhere in the larynx worrisome for malignancy.    Procedure (05/04/2024): Bilateral ear microscopy using microscope (CPT [id number]) Pre-procedure diagnosis: Erythema over right tympanic membrane Post-procedure diagnosis: same Indication: see above; given patient's otologic complaints and history, for improved and comprehensive examination of external ear and tympanic membrane, bilateral otologic examination using microscope was performed. Prior to proceeding, verbal consent was obtained after discussion  of R/B/A  Procedure: Patient was placed semi-recumbent. Both ear canals were examined using the microscope with findings above. Patient tolerated the procedure well.   Findings: - Left EAC patent, TM intact with well-pneumatized middle ear space - Right EAC patent, TM intact, hypervascularity on anterior aspect of the TM, does not appear to be within the middle ear space  Impression & Plans:  James Atkinson is a 78 y.o. male with   1. Chronic throat clearing   2. Hypertrophy of both inferior nasal turbinates   3. Nasal obstruction   4. Hypogeusia   5. Abnormal tympanic membrane, right   6. Hyposmia     Assessment and plan  Chronic throat clearing  Globus sensation Chronic throat clearing and globus likely due to laryngeal hypersensitivity,  with no anatomical abnormalities on laryngoscopy. Possibly initiated by a past cold or throat irritation, leading to a cycle of throat clearing and irritation. - Refer to voice therapist for throat clearing suppression therapy.  Intermittent nasal obstruction with decreased sense of taste and smell Intermittent nasal obstruction with intermittent decreased sense of taste and smell.  Discussed that loss of taste is often due to olfactory dysfunction.  He does have inferior turbinate hypertrophy and notices nasal obstruction that alternates sides. - Prescribe Flonase  nasal spray to reduce turbinate swelling and improve olfactory function. - Advise use of Flonase  for a couple of weeks to assess improvement in smell and taste.  History of ear trauma Hypervascularity on right tympanic membrane -Right tympanic membrane is hypervascular anteriorly, erythema does not appear to be coming from the middle ear space.  He has bilateral hearing loss, worse on the left side.  -Okay to continue to monitor.  If he notices changes in his hearing particularly on the right, acute pain on the right or fullness instructed him to call the office and we can perform another examination   See below regarding exact medications prescribed this encounter including dosages and route: Meds ordered this encounter  Medications   fluticasone  (FLONASE ) 50 MCG/ACT nasal spray    Sig: Place 2 sprays into both nostrils daily.    Dispense:  11.1 mL    Refill:  2      Thank you for allowing me the opportunity to care for your patient. Please do not hesitate to contact me should you have any other questions.  Sincerely, Hadassah Parody, MD Otolaryngologist (ENT), [facility] Phone: [phone] Fax: [phone]

## 2024-05-24 ENCOUNTER — Ambulatory Visit: Admitting: Speech Pathology

## 2024-05-24 DIAGNOSIS — K219 Gastro-esophageal reflux disease without esophagitis: Secondary | ICD-10-CM | POA: Insufficient documentation

## 2024-05-24 DIAGNOSIS — R0989 Other specified symptoms and signs involving the circulatory and respiratory systems: Secondary | ICD-10-CM | POA: Insufficient documentation

## 2024-05-24 DIAGNOSIS — R490 Dysphonia: Secondary | ICD-10-CM | POA: Insufficient documentation

## 2024-05-24 NOTE — Therapy (Signed)
 OUTPATIENT SPEECH LANGUAGE PATHOLOGY  EVALUATION   Patient Name: James Atkinson MRN: [id number] DOB:1945/10/22, 78 y.o., male Today's Date: 05/24/2024  PCP: Elsie Lesches, MD REFERRING PROVIDER: Hadassah Parody, MD   End of Session - 05/24/24 1242     Visit Number 1    Number of Visits 9    Date for Recertification  07/26/24    Authorization Type [employer]    Progress Note Due on Visit 10    SLP Start Time 1105    SLP Stop Time  1140    SLP Time Calculation (min) 35 min    Activity Tolerance Patient tolerated treatment well          Past Medical History:  Diagnosis Date   Allergy    Arthritis    thumbs, knees, back   Colon polyp    Complication of anesthesia    hard to wake up after colonoscopy   GERD (gastroesophageal reflux disease)    H/O measles    as child   H/O mumps    as child   Hemorrhoids    hx of   History of shingles    Hypercholesteremia    diet controlled   Hypertension    Mitral regurgitation    Pneumonia 07/06/2010   hx of   Ringing in ears    Past Surgical History:  Procedure Laterality Date   bilataterl total knee replacement     COLONOSCOPY     x2   ESOPHAGEAL DILATION     HEMORRHOID BANDING     x8    HERNIA REPAIR Bilateral 2012   INGUINAL HERNIA REPAIR Right 05/04/2022   Procedure: RIGHT INGUINAL HERNIA REPAIR WITH MESH;  Surgeon: Curvin Deward MOULD, MD;  Location: [facility];  Service: General;  Laterality: Right;   INSERTION OF MESH Right 05/04/2022   Procedure: INSERTION OF MESH;  Surgeon: Curvin Deward MOULD, MD;  Location: [facility];  Service: General;  Laterality: Right;   KNEE ARTHROSCOPY Left 23 years ago   NASAL SEPTUM SURGERY  25 years ago   ROTATOR CUFF REPAIR Left 7 years ago   TONSILLECTOMY  2nd grade   TOTAL KNEE ARTHROPLASTY Bilateral 02/14/2014   Procedure: BILATERAL TOTAL KNEE ARTHROPLASTY;  Surgeon: Dempsey Melodi GAILS, MD;  Location: [facility];  Service: Orthopedics;  Laterality:  Bilateral;   Patient Active Problem List   Diagnosis Date Noted   History of esophageal stricture 04/28/2023   Acquired trigger finger of left ring finger 03/29/2023   Bradycardia 04/09/2022   Unilateral recurrent inguinal hernia without obstruction or gangrene 03/27/2022   Allergy 03/31/2021   Arthritis 03/31/2021   Colon polyp 03/31/2021   Complication of anesthesia 03/31/2021   GERD (gastroesophageal reflux disease) 03/31/2021   H/O measles 03/31/2021   Hemorrhoids 03/31/2021   Hypercholesteremia 03/31/2021   Ringing in ears 03/31/2021   Heart murmur 05/13/2019   Moderate mitral regurgitation 02/20/2019   Dyslipidemia 02/20/2019   Constipation 02/26/2014   HTN (hypertension) 02/26/2014   Postoperative anemia due to acute blood loss 02/16/2014   OA (osteoarthritis) of knee 02/14/2014   Esophageal dysphagia 01/03/2014   Pneumonia 07/06/2010    ONSET DATE:  per pt report, several years ago: date of referral 05/04/2024  REFERRING DIAG: R09.89 (ICD-10-CM) - Chronic throat clearing   THERAPY DIAG:  Chronic throat clearing  Rationale for Evaluation and Treatment Rehabilitation  SUBJECTIVE:   SUBJECTIVE STATEMENT: Pt pleasant, eager, good historian Pt accompanied by: self  PERTINENT HISTORY and  DIAGNOSTIC FINDINGS: Pt is a 78 year old male who was referred by his otolaryngologist (Dr Hadassah Parody) for chronic throat clearing and pharyngeal discomfort. Per chart pt has experienced chronic throat clearing for over one year and feels like it is worsening. Sensation of needing to clear white mucus, no productive cough. Chronic throat clearing and globus likely due to laryngeal hypersensitivity, with no anatomical abnormalities on laryngoscopy. Possibly initiated by a past cold or throat irritation, leading to a cycle of throat clearing and irritation. - Refer to voice therapist for throat clearing suppression therapy.  Additionally, per GI note, pt with chronic hoarseness,  throat clearing, cough, GERD with stricture s/p dilation 2017 and 2024.   PAIN:  Are you having pain? No   FALLS: Has patient fallen in last 6 months? No,   LIVING ENVIRONMENT: Lives with: lives with their spouse Lives in: House/apartment  PLOF: Independent  PATIENT GOALS    to decrease throat clearing and improve vocal quality  OBJECTIVE:  COGNITION: Overall cognitive status: Within functional limits for tasks assessed Areas of impairment:   SOCIAL HISTORY: Occupation: works part-time as an Psychologist, Clinical intake: optimal Caffeine/alcohol intake: minimal Daily voice use: moderate Environmental risks: Dry or dusty environment Occupational risks: None identified Phonotraumatic behaviors: Excessive and/or habitual throat clearing  PERCEPTUAL VOICE ASSESSMENT: Voice quality: normal - pt reports that today is a good day for his voice I am not as hoarse today Vocal abuse: habitual throat clearing Resonance: normal Respiratory function: clavicular breathing  OBJECTIVE VOICE ASSESSMENT: Sustained ah maximum phonation time: 14 seconds Sustained ah loudness average: 77 dB Average fundamental frequency during sustained "ah":159 Hz   ( .5 SD above average of  145 Hz +/- 23 for gender)  Oral reading (passage) loudness average: 76 dB Oral reading loudness range: 26 dB Conversational pitch average: 147 Hz Highest dynamic pitch in conversational speech: 175 Hz Lowest dynamic pitch in conversational speech: 123 Hz Conversational pitch range: 52 Hz Conversational loudness average: 74 dB Conversational loudness range: 27 dB    ORAL MOTOR EXAMINATION Facial : WFL Lingual: WFL Velum: WFL Mandible: WFL Cough: WFL Voice: WFL   PATIENT REPORTED OUTCOME MEASURES (PROM): To be completed over the next 3 sessions   TODAY'S TREATMENT:  Skilled education provided on on cyclical chronic nature of habitual throat clearing, laryngeal hypersensitivity. Skilled verbal and  written instruction provided in strategies targeting suppression of throat clearing.     PATIENT EDUCATION: Education details: results of this assessment, ST POC Person educated: Patient Education method: Explanation and Handouts Education comprehension: verbalized understanding and needs further education   HOME EXERCISE PROGRAM: Implement strategies target suppression of throat clears     GOALS: Goals reviewed with patient? Yes  SHORT TERM GOALS: Target date: 10 sessions  Pt will use a substitute behavior (e.g., hard swallow, sip of water) 90% of the time in the next week to reduce the urge to clear his throat.  Baseline: Goal status: INITIAL  2.  Drink at least 1.5-2 L of water per day (or whatever is safe for you) to keep throat tissues hydrated and reduce the sensation that triggers clearing. Baseline:  Goal status: INITIAL   LONG TERM GOALS: Target date:   After 4 weeks of suppression work, reduce the total number of throat clears per day (as self-tracked) by, say, 90%. Baseline:  Goal status: INITIAL  2.  Build a "relapse prevention" plan: identify high-risk times (stress, allergies, after meals) and decide in advance what suppression strategies pt will  use in those times. Baseline:  Goal status: INITIAL  3.  Pt will Increase voluntary control when the urge arises -- being able to choose to resist clearing even when sensation is present. Baseline:  Goal status: INITIAL   ASSESSMENT:  CLINICAL IMPRESSION: Patient is a 78 y.o. male who was seen today for evaluation of voice and chronic throat clearing. During today's evaluation, his vocal quality is within functional limits and he demonstrated the ability to suppression his throat clear throughout 35 minutes with use of hard swallow. As a result, throat clear present x 2. Extensive education and instruction provided. Will provide treatment 1xweek to establish behavioral habit and promote laryngeal health. .    OBJECTIVE IMPAIRMENTS include voice disorder and chronic throat clearing. These impairments are limiting patient from effectively communicating at home and in community. Factors affecting potential to achieve goals and functional outcome are chronic nature. Patient will benefit from skilled SLP services to address above impairments and improve overall function.  REHAB POTENTIAL: Good  PLAN: SLP FREQUENCY: 1x/week  SLP DURATION: 8 weeks  PLANNED INTERVENTIONS: SLP instruction and feedback, Compensatory strategies, and Patient/family education   Rafaella Kole B. Rubbie, M.S., CCC-SLP, Tree Surgeon Certified Brain Injury Specialist [facility]  [facility] Office [phone] Ascom [phone] Fax [phone]

## 2024-05-30 ENCOUNTER — Ambulatory Visit: Admitting: Speech Pathology

## 2024-05-30 DIAGNOSIS — R0989 Other specified symptoms and signs involving the circulatory and respiratory systems: Secondary | ICD-10-CM | POA: Diagnosis not present

## 2024-05-30 NOTE — Therapy (Signed)
 OUTPATIENT SPEECH LANGUAGE PATHOLOGY  TREATMENT   Patient Name: James Atkinson MRN: [id number] DOB:02-May-1946, 78 y.o., male Today's Date: 05/30/2024  PCP: Elsie Lesches, MD REFERRING PROVIDER: Hadassah Parody, MD   End of Session - 05/30/24 1034     Visit Number 2    Number of Visits 9    Date for Recertification  07/26/24    Authorization Type [employer]    Progress Note Due on Visit 10    SLP Start Time 1019    SLP Stop Time  1035    SLP Time Calculation (min) 16 min    Activity Tolerance Patient tolerated treatment well          Past Medical History:  Diagnosis Date   Allergy    Arthritis    thumbs, knees, back   Colon polyp    Complication of anesthesia    hard to wake up after colonoscopy   GERD (gastroesophageal reflux disease)    H/O measles    as child   H/O mumps    as child   Hemorrhoids    hx of   History of shingles    Hypercholesteremia    diet controlled   Hypertension    Mitral regurgitation    Pneumonia 07/06/2010   hx of   Ringing in ears    Past Surgical History:  Procedure Laterality Date   bilataterl total knee replacement     COLONOSCOPY     x2   ESOPHAGEAL DILATION     HEMORRHOID BANDING     x8    HERNIA REPAIR Bilateral 2012   INGUINAL HERNIA REPAIR Right 05/04/2022   Procedure: RIGHT INGUINAL HERNIA REPAIR WITH MESH;  Surgeon: Curvin Deward MOULD, MD;  Location: [facility];  Service: General;  Laterality: Right;   INSERTION OF MESH Right 05/04/2022   Procedure: INSERTION OF MESH;  Surgeon: Curvin Deward MOULD, MD;  Location: [facility];  Service: General;  Laterality: Right;   KNEE ARTHROSCOPY Left 23 years ago   NASAL SEPTUM SURGERY  25 years ago   ROTATOR CUFF REPAIR Left 7 years ago   TONSILLECTOMY  2nd grade   TOTAL KNEE ARTHROPLASTY Bilateral 02/14/2014   Procedure: BILATERAL TOTAL KNEE ARTHROPLASTY;  Surgeon: Dempsey Melodi GAILS, MD;  Location: [facility];  Service: Orthopedics;  Laterality:  Bilateral;   Patient Active Problem List   Diagnosis Date Noted   History of esophageal stricture 04/28/2023   Acquired trigger finger of left ring finger 03/29/2023   Bradycardia 04/09/2022   Unilateral recurrent inguinal hernia without obstruction or gangrene 03/27/2022   Allergy 03/31/2021   Arthritis 03/31/2021   Colon polyp 03/31/2021   Complication of anesthesia 03/31/2021   GERD (gastroesophageal reflux disease) 03/31/2021   H/O measles 03/31/2021   Hemorrhoids 03/31/2021   Hypercholesteremia 03/31/2021   Ringing in ears 03/31/2021   Heart murmur 05/13/2019   Moderate mitral regurgitation 02/20/2019   Dyslipidemia 02/20/2019   Constipation 02/26/2014   HTN (hypertension) 02/26/2014   Postoperative anemia due to acute blood loss 02/16/2014   OA (osteoarthritis) of knee 02/14/2014   Esophageal dysphagia 01/03/2014   Pneumonia 07/06/2010    ONSET DATE:  per pt report, several years ago: date of referral 05/04/2024  REFERRING DIAG: R09.89 (ICD-10-CM) - Chronic throat clearing   THERAPY DIAG:  Chronic throat clearing  Rationale for Evaluation and Treatment Rehabilitation  SUBJECTIVE:   PERTINENT HISTORY and DIAGNOSTIC FINDINGS: Pt is a 78 year old male who was referred  by his otolaryngologist (Dr Hadassah Parody) for chronic throat clearing and pharyngeal discomfort. Per chart pt has experienced chronic throat clearing for over one year and feels like it is worsening. Sensation of needing to clear white mucus, no productive cough. Chronic throat clearing and globus likely due to laryngeal hypersensitivity, with no anatomical abnormalities on laryngoscopy. Possibly initiated by a past cold or throat irritation, leading to a cycle of throat clearing and irritation. - Refer to voice therapist for throat clearing suppression therapy.  Additionally, per GI note, pt with chronic hoarseness, throat clearing, cough, GERD with stricture s/p dilation 2017 and 2024.   PAIN:  Are  you having pain? No   FALLS: Has patient fallen in last 6 months? No,   LIVING ENVIRONMENT: Lives with: lives with their spouse Lives in: House/apartment  PLOF: Independent  PATIENT GOALS    to decrease throat clearing and improve vocal quality  SUBJECTIVE STATEMENT: Pt pleasant, eager Pt accompanied by: self  OBJECTIVE:  TODAY'S TREATMENT:  Skilled treatment session targeting pt's chronic throat clearing and cough. SLP facilitated session by providing the following interventions:   Pt reports great progress, states My wife even said I haven't heard you cough. I use to keep a sore throat alot and I don't think that it has been sore since last time. I have been drinking more water like you told me. I found some sugar free cough drops I am real concious of it at work and out in public. I am proud of myself because I am not good at breaking habits.   None were observed today during 16 minute treatment session.   PATIENT EDUCATION: Education details: cough suppression activities Person educated: Patient Education method: Chief Technology Officer Education comprehension: verbalized understanding and needs further education   HOME EXERCISE PROGRAM: Implement strategies target suppression of throat clears   GOALS: Goals reviewed with patient? Yes  SHORT TERM GOALS: Target date: 10 sessions  Pt will use a substitute behavior (e.g., hard swallow, sip of water) 90% of the time in the next week to reduce the urge to clear his throat.  Baseline: Goal status: INITIAL  2.  Drink at least 1.5-2 L of water per day (or whatever is safe for you) to keep throat tissues hydrated and reduce the sensation that triggers clearing. Baseline:  Goal status: INITIAL   LONG TERM GOALS: Target date:   After 4 weeks of suppression work, reduce the total number of throat clears per day (as self-tracked) by, say, 90%. Baseline:  Goal status: INITIAL  2.  Build a "relapse prevention" plan:  identify high-risk times (stress, allergies, after meals) and decide in advance what suppression strategies pt will use in those times. Baseline:  Goal status: INITIAL  3.  Pt will Increase voluntary control when the urge arises -- being able to choose to resist clearing even when sensation is present. Baseline:  Goal status: INITIAL   ASSESSMENT:  CLINICAL IMPRESSION: Patient is a 78 y.o. male who was seen today for evaluation of voice and chronic throat clearing. During today's evaluation, his vocal quality is within functional limits and he demonstrated the ability to suppression his throat clear throughout 35 minutes with use of hard swallow. As a result, throat clear present x 2. Extensive education and instruction provided. Will provide treatment 1xweek to establish behavioral habit and promote laryngeal health.   Pt reports great progress, no further treatment needed at this time. Pt will follow up if additional services needed. See above note  for more details.   OBJECTIVE IMPAIRMENTS include voice disorder and chronic throat clearing. These impairments are limiting patient from effectively communicating at home and in community. Factors affecting potential to achieve goals and functional outcome are chronic nature. Patient will benefit from skilled SLP services to address above impairments and improve overall function.  REHAB POTENTIAL: Good  PLAN: SLP FREQUENCY: 1x/week  SLP DURATION: 8 weeks  PLANNED INTERVENTIONS: SLP instruction and feedback, Compensatory strategies, and Patient/family education  Hassell Roys, SLP Student Clinician   Happi B. Rubbie, M.S., CCC-SLP, Tree Surgeon Certified Brain Injury Specialist [facility]  [facility] Office [phone] Ascom [phone] Fax [phone]

## 2024-06-17 ENCOUNTER — Other Ambulatory Visit: Payer: Self-pay | Admitting: Internal Medicine
